# Patient Record
Sex: Male | Born: 1935
Health system: Southern US, Community
[De-identification: ages and names within clinical notes are randomized; demographics above are authoritative.]

## PROBLEM LIST (undated history)

## (undated) DIAGNOSIS — I10 Essential (primary) hypertension: Secondary | ICD-10-CM

## (undated) DIAGNOSIS — I442 Atrioventricular block, complete: Secondary | ICD-10-CM

## (undated) DIAGNOSIS — E119 Type 2 diabetes mellitus without complications: Secondary | ICD-10-CM

## (undated) DIAGNOSIS — C801 Malignant (primary) neoplasm, unspecified: Secondary | ICD-10-CM

## (undated) DIAGNOSIS — I2510 Atherosclerotic heart disease of native coronary artery without angina pectoris: Secondary | ICD-10-CM

## (undated) DIAGNOSIS — H5440 Blindness, one eye, unspecified eye: Secondary | ICD-10-CM

## (undated) DIAGNOSIS — L03211 Cellulitis of face: Secondary | ICD-10-CM

## (undated) DIAGNOSIS — H3530 Unspecified macular degeneration: Secondary | ICD-10-CM

## (undated) DIAGNOSIS — N184 Chronic kidney disease, stage 4 (severe): Secondary | ICD-10-CM

## (undated) DIAGNOSIS — E785 Hyperlipidemia, unspecified: Secondary | ICD-10-CM

## (undated) DIAGNOSIS — I4821 Permanent atrial fibrillation: Secondary | ICD-10-CM

## (undated) DIAGNOSIS — E039 Hypothyroidism, unspecified: Secondary | ICD-10-CM

## (undated) HISTORY — DX: Blindness, one eye, unspecified eye: H54.40

## (undated) HISTORY — DX: Unspecified macular degeneration: H35.30

## (undated) HISTORY — DX: Hyperlipidemia, unspecified: E78.5

## (undated) HISTORY — DX: Atherosclerotic heart disease of native coronary artery without angina pectoris: I25.10

## (undated) HISTORY — DX: Permanent atrial fibrillation: I48.21

## (undated) HISTORY — PX: INSERT / REPLACE / REMOVE PACEMAKER: SUR710

## (undated) HISTORY — DX: Hypothyroidism, unspecified: E03.9

## (undated) HISTORY — DX: Atrioventricular block, complete: I44.2

---

## 1995-06-12 HISTORY — PX: OTHER SURGICAL HISTORY: SHX169

## 2002-01-26 ENCOUNTER — Encounter: Payer: Self-pay | Admitting: Urology

## 2002-01-26 ENCOUNTER — Encounter: Admission: RE | Admit: 2002-01-26 | Discharge: 2002-01-26 | Payer: Self-pay | Admitting: Urology

## 2002-05-20 ENCOUNTER — Ambulatory Visit: Admission: RE | Admit: 2002-05-20 | Discharge: 2002-06-01 | Payer: Self-pay | Admitting: Radiation Oncology

## 2003-07-14 ENCOUNTER — Ambulatory Visit: Admission: RE | Admit: 2003-07-14 | Discharge: 2003-10-12 | Payer: Self-pay | Admitting: Radiation Oncology

## 2003-10-19 ENCOUNTER — Ambulatory Visit: Admission: RE | Admit: 2003-10-19 | Discharge: 2004-01-17 | Payer: Self-pay | Admitting: Radiation Oncology

## 2006-07-18 ENCOUNTER — Ambulatory Visit: Payer: Self-pay | Admitting: Internal Medicine

## 2006-07-18 LAB — CONVERTED CEMR LAB
Basophils Relative: 0.9 % (ref 0.0–1.0)
Eosinophils Relative: 5.9 % — ABNORMAL HIGH (ref 0.0–5.0)
HCT: 35.4 % — ABNORMAL LOW (ref 39.0–52.0)
INR: 1 (ref 0.9–2.0)
MCV: 88.3 fL (ref 78.0–100.0)
Neutrophils Relative %: 47.8 % (ref 43.0–77.0)
Prothrombin Time: 12.1 s (ref 10.0–14.0)
RBC: 4.01 M/uL — ABNORMAL LOW (ref 4.22–5.81)
RDW: 11.9 % (ref 11.5–14.6)
WBC: 5.6 10*3/uL (ref 4.5–10.5)

## 2006-07-19 ENCOUNTER — Ambulatory Visit: Payer: Self-pay | Admitting: Internal Medicine

## 2006-07-19 ENCOUNTER — Encounter (INDEPENDENT_AMBULATORY_CARE_PROVIDER_SITE_OTHER): Payer: Self-pay | Admitting: *Deleted

## 2006-08-06 ENCOUNTER — Ambulatory Visit (HOSPITAL_COMMUNITY): Admission: RE | Admit: 2006-08-06 | Discharge: 2006-08-06 | Payer: Self-pay | Admitting: Internal Medicine

## 2007-07-04 ENCOUNTER — Ambulatory Visit: Payer: Self-pay | Admitting: Cardiovascular Disease

## 2007-07-04 ENCOUNTER — Inpatient Hospital Stay (HOSPITAL_COMMUNITY): Admission: EM | Admit: 2007-07-04 | Discharge: 2007-07-09 | Payer: Self-pay | Admitting: Emergency Medicine

## 2007-07-07 ENCOUNTER — Encounter: Payer: Self-pay | Admitting: Cardiovascular Disease

## 2007-07-30 ENCOUNTER — Ambulatory Visit: Payer: Self-pay | Admitting: Cardiology

## 2007-10-14 ENCOUNTER — Ambulatory Visit: Payer: Self-pay | Admitting: Internal Medicine

## 2007-10-29 ENCOUNTER — Ambulatory Visit: Payer: Self-pay | Admitting: Cardiology

## 2008-06-30 ENCOUNTER — Ambulatory Visit: Payer: Self-pay | Admitting: Cardiology

## 2008-08-12 ENCOUNTER — Encounter: Payer: Self-pay | Admitting: Internal Medicine

## 2008-08-17 ENCOUNTER — Ambulatory Visit: Payer: Self-pay | Admitting: Internal Medicine

## 2008-09-29 ENCOUNTER — Ambulatory Visit: Payer: Self-pay | Admitting: Internal Medicine

## 2008-12-29 ENCOUNTER — Ambulatory Visit: Payer: Self-pay | Admitting: Internal Medicine

## 2009-01-27 IMAGING — RF DG BE W/ CM - WO/W KUB
15 of 19 series · 15 of 19 positions shown · non-contrast
Comparison: none

CLINICAL DATA: Rectal bleeding, incomplete colonoscopy.  The patient states that ?90%? of his colon was examined at recent colonoscopy. 
 BARIUM ENEMA:

[Series 1: run · 1 of 1 slices shown (1 of 12)]
[im 1/1]
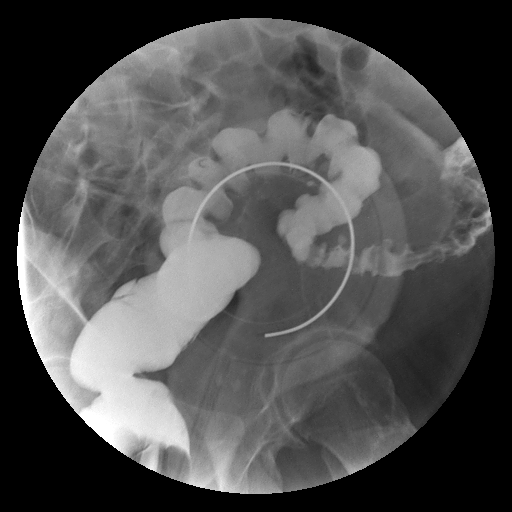

[Series 2: run · 1 of 1 slices shown (2 of 12)]
[im 1/1]
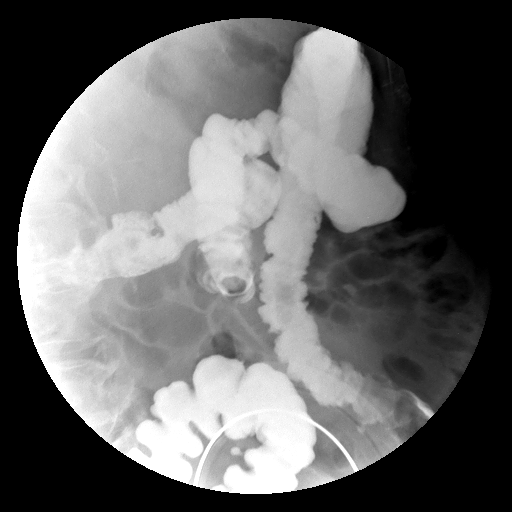

[Series 4: run · 1 of 1 slices shown (3 of 12)]
[im 1/1]
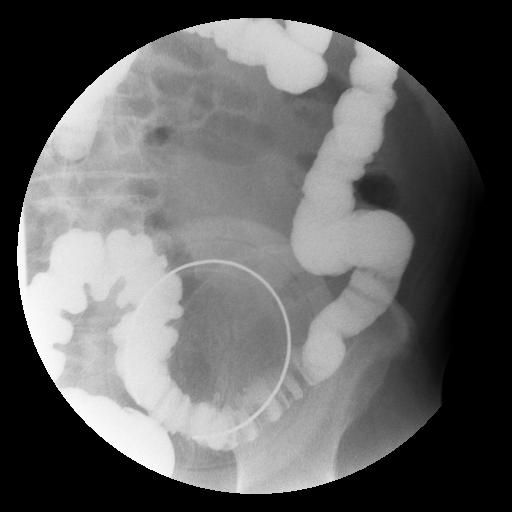

[Series 5: run · 1 of 1 slices shown (4 of 12)]
[im 1/1]
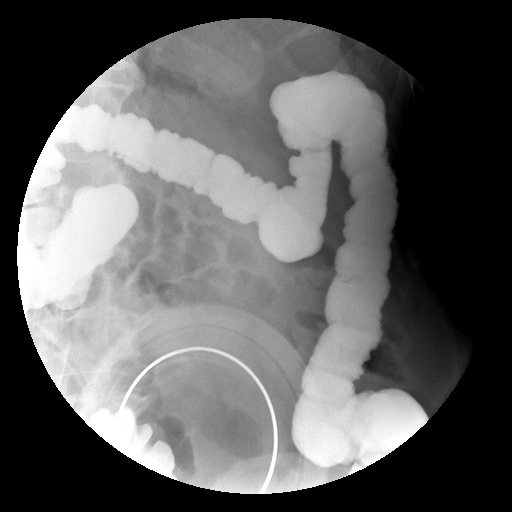

[Series 6: run · 1 of 1 slices shown (5 of 12)]
[im 1/1]
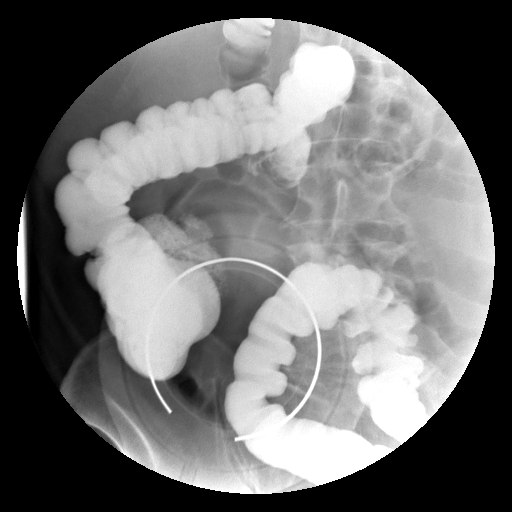

[Series 7: run · 1 of 1 slices shown (6 of 12)]
[im 1/1]
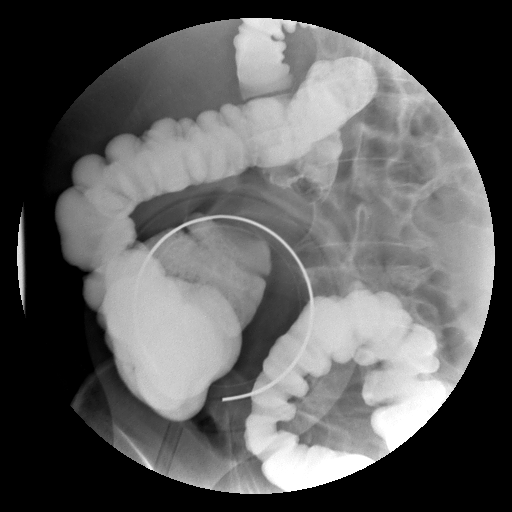

[Series 9: run · 1 of 1 slices shown (7 of 12)]
[im 1/1]
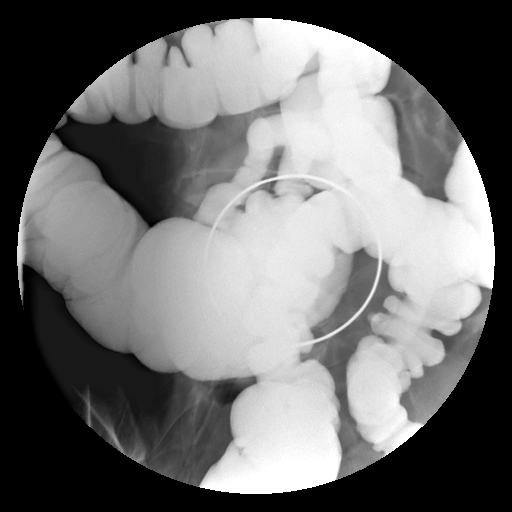

[Series 10: run · 1 of 1 slices shown (8 of 12)]
[im 1/1]
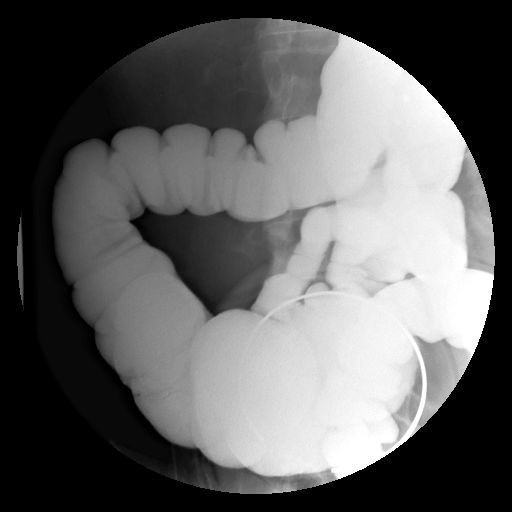

[Series 11: run · 1 of 1 slices shown (9 of 12)]
[im 1/1]
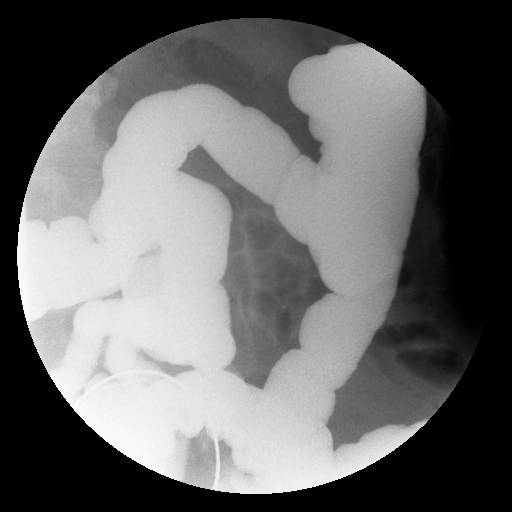

[Series 13: run · 1 of 1 slices shown (10 of 12)]
[im 1/1]
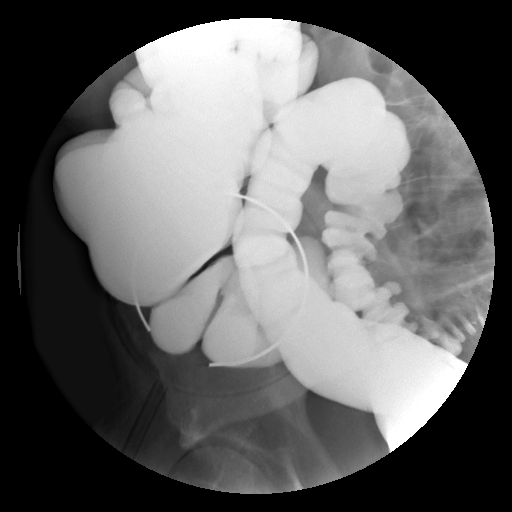

[Series 14: run · 1 of 1 slices shown (11 of 12)]
[im 1/1]
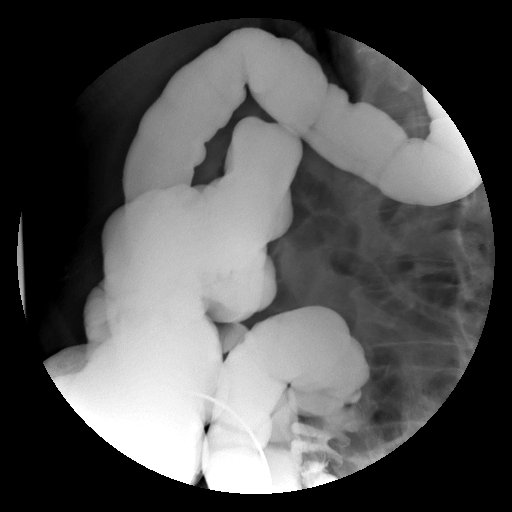

[Series 15: run · 1 of 1 slices shown (12 of 12)]
[im 1/1]
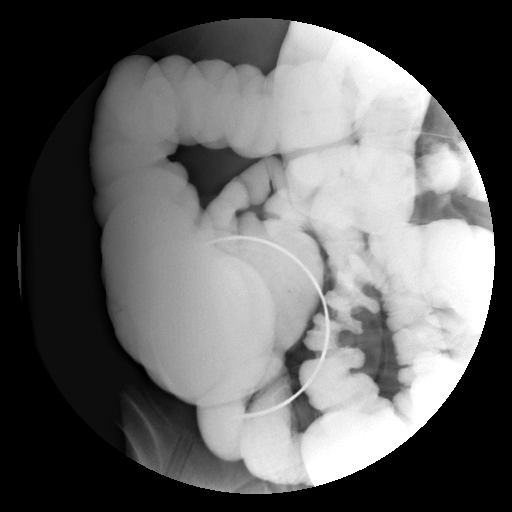

[Series 1001: view not recorded · 0.20mm/px · 1 of 1 slices shown (1 of 3)]
[im 1/1]
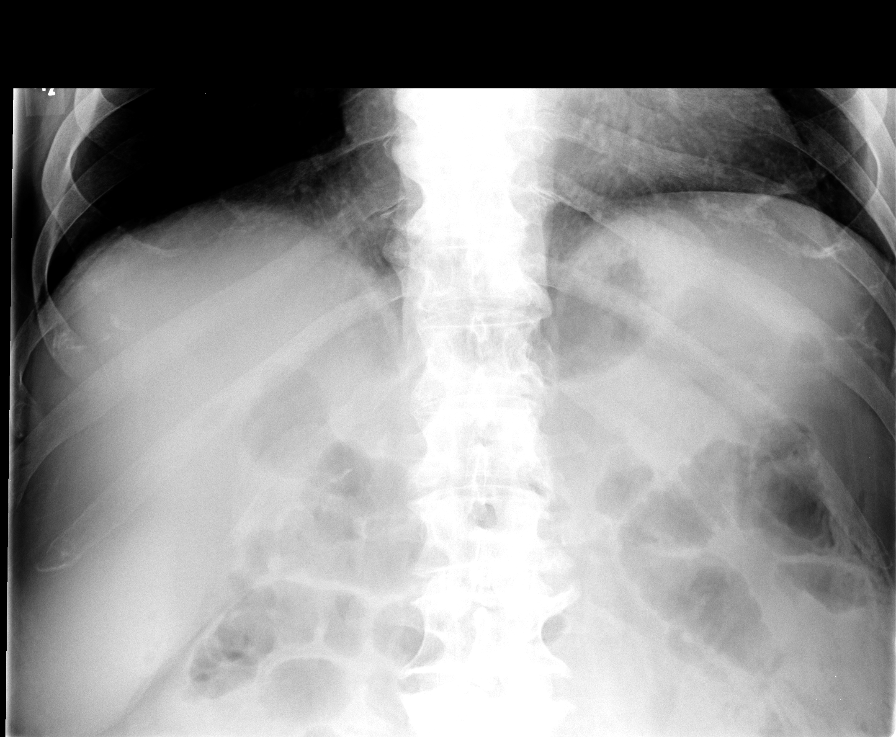

[Series 1003: view not recorded · 0.20mm/px · 1 of 1 slices shown (2 of 3)]
[im 1/1]
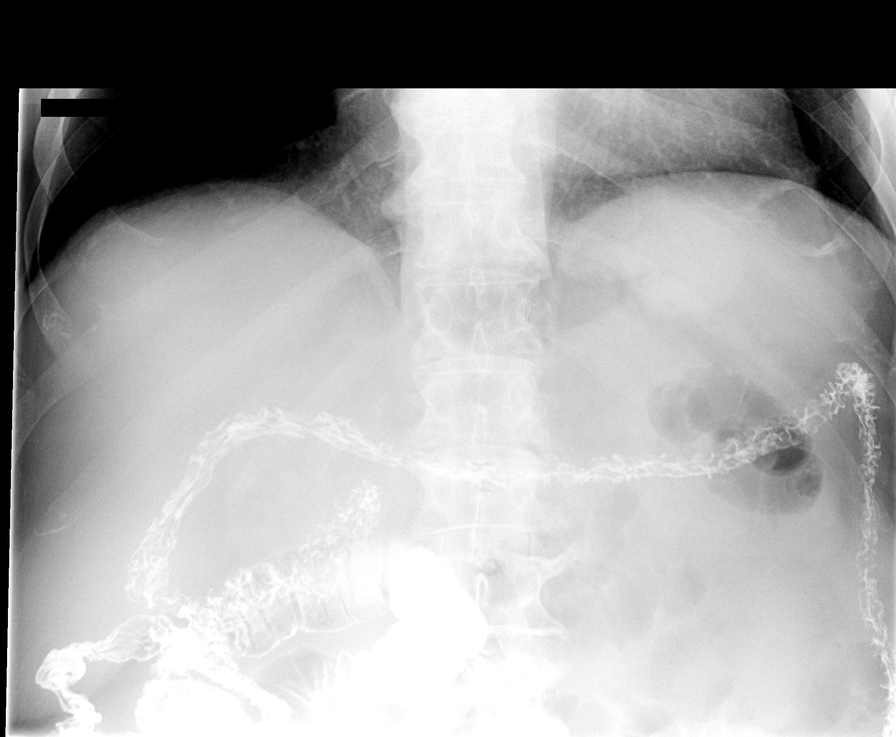

[Series 1004: view not recorded · 0.20mm/px · 1 of 1 slices shown (3 of 3)]
[im 1/1]
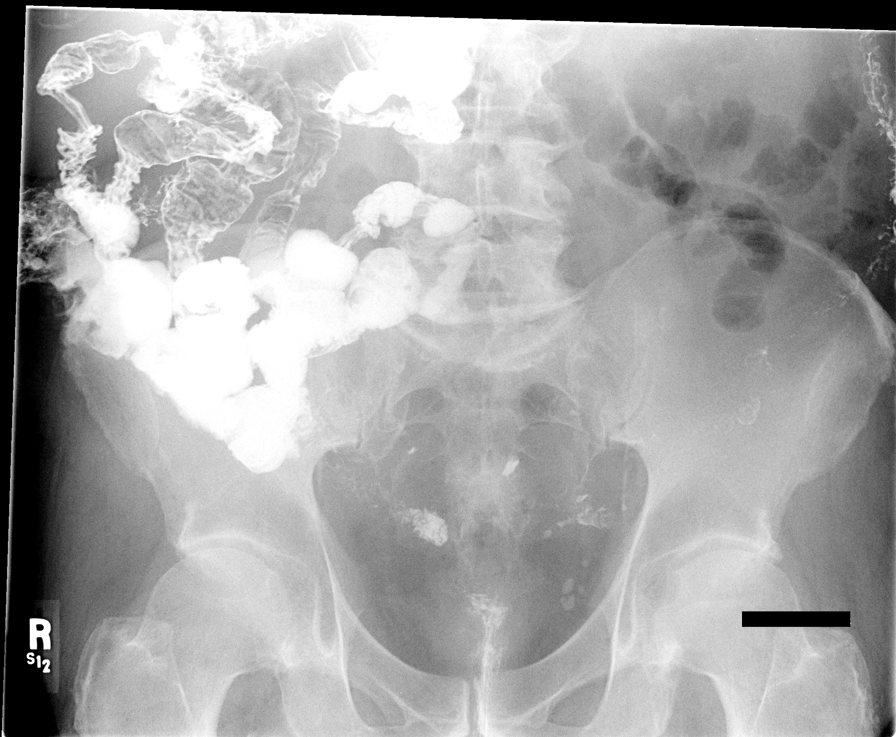

[15 of 19 positions shown; findings below may reference images not displayed]

FINDINGS: KUB ? unremarkable. 
 Barium was introduced into the rectum and advanced to the cecum without obstruction or delay.  Multiple compression spot films were obtained as the barium column advanced.  
 The colon is quite redundant, especially at the flexures and in the cecal region.  The cecum is elongated and extends up toward the left upper quadrant distally.  There are no filling defects that would suggest significant polyps.  No mucosal lesions are noted that would be consistent with carcinoma.  
 There are sigmoid diverticula with no evidence for diverticulitis.  There is reflux into the distal small bowel.
IMPRESSION: 1.  Markedly redundant colon with an elongated cecum. 
 2.  There are scattered diverticula in the sigmoid colon, but there are no other lesions demonstrated.  Specifically, the cecum and ascending colon are unremarkable.

## 2009-02-25 ENCOUNTER — Ambulatory Visit: Payer: Self-pay | Admitting: Internal Medicine

## 2009-03-02 ENCOUNTER — Telehealth: Payer: Self-pay | Admitting: Internal Medicine

## 2009-03-30 ENCOUNTER — Ambulatory Visit: Payer: Self-pay | Admitting: Internal Medicine

## 2009-04-07 ENCOUNTER — Encounter (INDEPENDENT_AMBULATORY_CARE_PROVIDER_SITE_OTHER): Payer: Self-pay | Admitting: *Deleted

## 2009-06-29 ENCOUNTER — Ambulatory Visit: Payer: Self-pay | Admitting: Internal Medicine

## 2009-07-12 DIAGNOSIS — L03211 Cellulitis of face: Secondary | ICD-10-CM

## 2009-07-12 HISTORY — DX: Cellulitis of face: L03.211

## 2009-07-13 ENCOUNTER — Inpatient Hospital Stay (HOSPITAL_COMMUNITY): Admission: EM | Admit: 2009-07-13 | Discharge: 2009-07-17 | Payer: Self-pay | Admitting: Emergency Medicine

## 2009-09-28 ENCOUNTER — Ambulatory Visit: Payer: Self-pay | Admitting: Internal Medicine

## 2009-12-28 ENCOUNTER — Encounter: Payer: Self-pay | Admitting: Internal Medicine

## 2010-02-02 ENCOUNTER — Ambulatory Visit: Payer: Self-pay | Admitting: Internal Medicine

## 2010-02-02 DIAGNOSIS — Z950 Presence of cardiac pacemaker: Secondary | ICD-10-CM

## 2010-02-02 DIAGNOSIS — R0989 Other specified symptoms and signs involving the circulatory and respiratory systems: Secondary | ICD-10-CM

## 2010-02-02 DIAGNOSIS — E785 Hyperlipidemia, unspecified: Secondary | ICD-10-CM

## 2010-02-02 DIAGNOSIS — I442 Atrioventricular block, complete: Secondary | ICD-10-CM | POA: Insufficient documentation

## 2010-02-02 DIAGNOSIS — R42 Dizziness and giddiness: Secondary | ICD-10-CM | POA: Insufficient documentation

## 2010-02-02 DIAGNOSIS — R0609 Other forms of dyspnea: Secondary | ICD-10-CM | POA: Insufficient documentation

## 2010-02-03 ENCOUNTER — Encounter: Payer: Self-pay | Admitting: Internal Medicine

## 2010-02-16 ENCOUNTER — Telehealth (INDEPENDENT_AMBULATORY_CARE_PROVIDER_SITE_OTHER): Payer: Self-pay | Admitting: *Deleted

## 2010-02-20 ENCOUNTER — Encounter (HOSPITAL_COMMUNITY): Admission: RE | Admit: 2010-02-20 | Discharge: 2010-03-01 | Payer: Self-pay | Admitting: Internal Medicine

## 2010-02-20 ENCOUNTER — Ambulatory Visit: Payer: Self-pay | Admitting: Cardiovascular Disease

## 2010-02-20 ENCOUNTER — Ambulatory Visit: Payer: Self-pay

## 2010-02-20 ENCOUNTER — Encounter: Payer: Self-pay | Admitting: Cardiovascular Disease

## 2010-04-18 ENCOUNTER — Encounter: Payer: Self-pay | Admitting: Cardiology

## 2010-05-11 ENCOUNTER — Ambulatory Visit: Payer: Self-pay | Admitting: Internal Medicine

## 2010-05-12 ENCOUNTER — Encounter: Payer: Self-pay | Admitting: Cardiology

## 2010-07-02 ENCOUNTER — Encounter: Payer: Self-pay | Admitting: Nephrology

## 2010-07-11 NOTE — Miscellaneous (Signed)
  Clinical Lists Changes  Observations: Added new observation of NUCLEAR NOS: Exercise Capacity: Adenosine study with no exercise. BP Response: Normal blood pressure response. Clinical Symptoms: Chest Tightness ECG Impression: LBBB Overall Impression: Normal stress nuclear study.  (02/20/2010 9:48)      Nuclear Study  Procedure date:  02/20/2010  Findings:      Exercise Capacity: Adenosine study with no exercise. BP Response: Normal blood pressure response. Clinical Symptoms: Chest Tightness ECG Impression: LBBB Overall Impression: Normal stress nuclear study.

## 2010-07-11 NOTE — Cardiovascular Report (Signed)
Summary: TTM   TTM   Imported By: Sallee Provencal 01/06/2010 08:49:35  _____________________________________________________________________  External Attachment:    Type:   Image     Comment:   External Document

## 2010-07-11 NOTE — Progress Notes (Signed)
Summary: Nuclear pre procedure  Phone Note Outgoing Call Call back at Home Phone [phone]   Call placed by: Valetta Fuller, CMA,  February 16, 2010 3:23 PM Call placed to: Patient Summary of Call: Reviewed information on Myoview Information Sheet (see scanned document for further details).  Spoke with patient.      Nuclear Med Background Indications for Stress Test: Evaluation for Ischemia, Stent Patency   History: Heart Catheterization, Pacemaker, Stents   Symptoms: Dizziness, DOE    Nuclear Pre-Procedure Cardiac Risk Factors: Hypertension, LBBB, Lipids Height (in): 74

## 2010-07-11 NOTE — Cardiovascular Report (Signed)
Summary: Office Visit   Office Visit   Imported By: Sallee Provencal 02/03/2010 10:21:16  _____________________________________________________________________  External Attachment:    Type:   Image     Comment:   External Document

## 2010-07-11 NOTE — Cardiovascular Report (Signed)
Summary: TTM   TTM   Imported By: Sallee Provencal 07/29/2009 15:52:06  _____________________________________________________________________  External Attachment:    Type:   Image     Comment:   External Document

## 2010-07-11 NOTE — Assessment & Plan Note (Signed)
Summary: Cardiology Nuclear Testing  Nuclear Med Background Indications for Stress Test: Evaluation for Ischemia, Stent Patency   History: Heart Catheterization, Pacemaker, Stents   Symptoms: Dizziness, DOE, Fatigue    Nuclear Pre-Procedure Cardiac Risk Factors: Family History - CAD, History of Smoking, Hypertension, LBBB, Lipids Caffeine/Decaff Intake: None NPO After: 8:00 PM Lungs: clear IV 0.9% NS with Angio Cath: 20g     IV Site: R Antecubital IV Started by: Irven Baltimore, RN Chest Size (in) 46     Height (in): 74 Weight (lb): 228 BMI: 29.38 Tech Comments: Held metoprolol this am.  Nuclear Med Study 1 or 2 day study:  1 day     Stress Test Type:  Adenosine Reading MD:  Jenkins Rouge, MD     Referring MD:  Jolyn Nap Resting Radionuclide:  Technetium 97m Tetrofosmin     Resting Radionuclide Dose:  11 mCi  Stress Radionuclide:  Technetium 78m Tetrofosmin     Stress Radionuclide Dose:  33 mCi   Stress Protocol  Max Systolic BP: Q000111Q mm HgDose of Adenosine:  58 mg    Stress Test Technologist:  Matilde Haymaker, RN     Nuclear Technologist:  Annye Rusk, CNMT  Rest Procedure  Myocardial perfusion imaging was performed at rest 45 minutes following the intravenous administration of Technetium 5m Tetrofosmin.  Stress Procedure  The patient received IV adenosine at 140 mcg/kg/min for 4 minutes.Patient had some whoozy feeling,SOB and chest tightness with infusion. Symptoms relieved in recovery.  Patient had PVC's with infusion. There were no significant changes with infusion. Technetium 42m Tetrofosmin was injected at the 2 minute mark and quantitative spect images were obtained after a 45 minute delay.  QPS Raw Data Images:  Normal; no motion artifact; normal heart/lung ratio. Stress Images:  Normal homogeneous uptake in all areas of the myocardium. Rest Images:  Normal homogeneous uptake in all areas of the myocardium. Subtraction (SDS):  Normal Transient Ischemic  Dilatation:  0.99  (Normal <1.22)  Lung/Heart Ratio:  0.33  (Normal <0.45)  Quantitative Gated Spect Images QGS EDV:  92 ml QGS ESV:  34 ml QGS EF:  63 % QGS cine images:  normal  Findings Normal nuclear study      Overall Impression  Exercise Capacity: Adenosine study with no exercise. BP Response: Normal blood pressure response. Clinical Symptoms: Chest Tightness ECG Impression: LBBB Overall Impression: Normal stress nuclear study.  Appended Document: Cardiology Nuclear Testing PT AWARE./CY

## 2010-07-11 NOTE — Assessment & Plan Note (Signed)
Summary: problems with choloesterol med/mt  Medications Added HYDROCHLOROTHIAZIDE 25 MG TABS (HYDROCHLOROTHIAZIDE) 1/2 tablet once daily PLAVIX 75 MG TABS (CLOPIDOGREL BISULFATE) take one tablet once daily LEVOTHROID 150 MCG TABS (LEVOTHYROXINE SODIUM) once daily METOPROLOL TARTRATE 50 MG TABS (METOPROLOL TARTRATE) take one tablet once daily LIPITOR 20 MG TABS (ATORVASTATIN CALCIUM) take one tablet once daily NITROSTAT 0.4 MG SUBL (NITROGLYCERIN) as needed LOVAZA 1 GM CAPS (OMEGA-3-ACID ETHYL ESTERS) take 2 capsules two times a day CHELATED ZINC 50 MG TABS (ZINC) every other day CENTRUM  TABS (MULTIPLE VITAMINS-MINERALS) once daily BAYER ASPIRIN 325 MG TABS (ASPIRIN) take one tablet once daily LIPOFEN 150 MG CAPS (FENOFIBRATE) 1 once daily      Allergies Added: NKDA  CC:  problems with cholesterol.  Marland Kitchen  History of Present Illness:   [name] is seen in followup for a pacemaker implanted for CHB jan 2009.  He aslo has CAD wqith a chronically occluded right coronary artery. He had an LAD with 90% stenosis at the take off of the first diagonal.  He had a drug-eluting stent placed by Dr. Burt Knack .  He also had a history of dyslipidemia and he comes in with his daughter today concerned about his medications. Specifically he is taking 3 lipid-lowering agent Lipitor, fenofibrate, and fish oils.  He is also having problems with exertional shortness of breath accompanied by a flank discomfort. This is a relatively stable over the last 6-12 months but is significantly limiting. It occurs at less than about 100 feet. It does not occur at rest.When he presented with his MI he had anterior chest tightness; this is different from the current discomfort.  He has not had peripheral edema orthopnea or nocturnal dyspnea  Current Medications (verified): 1)  Hydrochlorothiazide 25 Mg Tabs (Hydrochlorothiazide) .... 1/2 Tablet Once Daily 2)  Lipitor 20 Mg Tabs (Atorvastatin Calcium) .Marland Kitchen.. 1 Tab By Mouth At  Bedtime 3)  Plavix 75 Mg Tabs (Clopidogrel Bisulfate) .... Take One Tablet Once Daily 4)  Levothroid 150 Mcg Tabs (Levothyroxine Sodium) .... Once Daily 5)  Metoprolol Tartrate 50 Mg Tabs (Metoprolol Tartrate) .... Take One Tablet Once Daily 6)  Nitrostat 0.4 Mg Subl (Nitroglycerin) .... As Needed 7)  Lovaza 1 Gm Caps (Omega-3-Acid Ethyl Esters) .... Take 2 Capsules Two Times A Day 8)  Chelated Zinc 50 Mg Tabs (Zinc) .... Every Other Day 9)  Centrum  Tabs (Multiple Vitamins-Minerals) .... Once Daily 10)  Bayer Aspirin 325 Mg Tabs (Aspirin) .... Take One Tablet Once Daily 11)  Lipofen 150 Mg Caps (Fenofibrate) .Marland Kitchen.. 1 Once Daily  Allergies (verified): No Known Drug Allergies  Past History:  Social History: Last updated: 02/02/2010 Married with 3 children, lives with wife.  He is retired from [employer].  He does not smoke.  He does not drink alcohol.   Past Medical History: Coronary artery disease (non-Q-wave myocardial infarction in     January 2009.  He had a chronically occluded right coronary artery.     He had an LAD with 90% stenosis at the take off of the first     diagonal.  He had a drug-eluting stent placed by Dr. Burt Knack). Complete heart block (Zephyr dual chamber pacemaker per Dr. Caryl Comes). 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  He is retired from [employer].  He does not smoke.  He does not drink alcohol.   Vital Signs:  Patient profile:   77 year old male Height:      74 inches Weight:      230 pounds BMI:     29.64 Pulse rate:   61 / minute Pulse rhythm:   regular BP sitting:   108 / 62  (left arm)  Vitals Entered By: Doug Sou CMA (February 02, 2010 3:32 PM)  Physical Exam  General:  The patient was alert and oriented in no acute distress. 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  Neck veins were flat, carotids were brisk.  Lungs were clear.  Heart sounds were regular without murmurs or gallops.  Abdomen was soft with active bowel sounds. There is no clubbing cyanosis or edema. Skin Warm and dry    EKG  Procedure date:  02/02/2010  Findings:      AV pacing  PPM Specifications Following MD:  Virl Axe, MD     PPM Vendor:  [employer] Jude     PPM Model Number:  [id number]     PPM Serial Number:  [id number] PPM DOI:  07/08/2007     PPM Implanting MD:  Virl Axe, MD  Lead 1    Location: RA     DOI: 07/08/2007     Model #: [id number]     Serial #: [id number]     Status: active Lead 2    Location: RV     DOI: 07/08/2007     Model #: [id number]     Serial #: [id number]     Status: active  Magnet Response Rate:  BOL 98.6 ERI 86.3  Indications:  CHB   PPM Follow Up Battery Voltage:  2.79 V     Battery Est. Longevity:  6.75-9.25 yrs       PPM Device Measurements Atrium  Amplitude: 4.6 mV, Impedance: 423 ohms, Threshold: 0.75 V at 0.4 msec Right Ventricle  Amplitude: 8.6 mV, Impedance: 403 ohms, Threshold: 0.875 V at 0.4 msec  Episodes MS Episodes:  56     Percent Mode Switch:  <1%     Ventricular High Rate:  0     Atrial Pacing:  40%     Ventricular Pacing:  >99%  Parameters Mode:  DDD     Lower Rate Limit:  60     Upper Rate Limit:  110 Paced AV Delay:  180     Sensed AV Delay:  160 Next Cardiology Appt Due:  07/12/2010 Tech Comments:  NORMAL DEVICE FUNCTION.  CHANGED RV OUTPUT FROM 1.00 TO 1.125 V.  ROV IN 6 MTHS W/DEVICE CLINIC. Shelly Bombard  February 02, 2010 4:20 PM  Impression & Recommendations:  Problem # 1:  DYSPNEA ON EXERTION (ICD-786.09)  thas dyspnea on exertion associated with unusual chest/back discomfort. This may represent an anginal equivalent. He is also ventricularly paced 100% of the time raising the possibility of a cardiomyopathy contributing to his symptoms. We'll investigate these both simultaneously with an adenosine Myoview scan.  I  should note that he is chronically competent as assessed by his device histogram printout  Orders: Nuclear Stress Test (Nuc Stress Test)  Problem # 2:  ORTHOSTATIC DIZZINESS (ICD-780.4) this is been a problem. It was some better now than it has been. His blood pressure is borderline at 108 today. We'll plan to discontinue his hydrochlorothiazide  Problem # 3:  CAD RCA-TOTAL; LAD-DES JAN 2009 (ICD-414.00)  please see the above; we'll decrease his aspirin from 325-81 mg His updated medication list for this  problem includes:    Plavix 75 Mg Tabs (Clopidogrel bisulfate) .Marland Kitchen... Take one tablet once daily    Metoprolol Tartrate 50 Mg Tabs (Metoprolol tartrate) .Marland Kitchen... Take one tablet once daily    Nitrostat 0.4 Mg Subl (Nitroglycerin) .Marland Kitchen... As needed    Bayer Aspirin 325 Mg Tabs (Aspirin) .Marland Kitchen... Take one tablet once daily  His updated medication list for this problem includes:    Plavix 75 Mg Tabs (Clopidogrel bisulfate) .Marland Kitchen... Take one tablet once daily    Metoprolol Tartrate 50 Mg Tabs (Metoprolol tartrate) .Marland Kitchen... Take one tablet once daily    Nitrostat 0.4 Mg Subl (Nitroglycerin) .Marland Kitchen... As needed    Bayer Aspirin 325 Mg Tabs (Aspirin) .Marland Kitchen... Take one tablet once daily  Problem # 4:  AV BLOCK, COMPLETE (ICD-426.0) the patient has an escape rhythm but has complete heart block  Problem # 5:  PACEMAKER, PERMANENT (ICD-V45.01) Device parameters and data were reviewed and no changes were made  Problem # 6:  DYSLIPIDEMIA (ICD-272.4) the patient is concerned about his lipid medications. We do not have access to these data; there do again tomorrow. We will plan this point to stop his fenofibrate. Will he'll leave on the other medications and see how he's doing. We'll have him follow with Dr. Percival Spanish in 3 months time and let him adjudicate the medications The following medications were removed from the medication list:    Lipitor 20 Mg Tabs (Atorvastatin calcium) .Marland Kitchen... Take one tablet once daily His  updated medication list for this problem includes:    Lipitor 20 Mg Tabs (Atorvastatin calcium) .Marland Kitchen... 1 tab by mouth at bedtime    Lovaza 1 Gm Caps (Omega-3-acid ethyl esters) .Marland Kitchen... Take 2 capsules two times a day    Lipofen 150 Mg Caps (Fenofibrate) .Marland Kitchen... 1 once daily  The following medications were removed from the medication list:    Lipitor 20 Mg Tabs (Atorvastatin calcium) .Marland Kitchen... Take one tablet once daily His updated medication list for this problem includes:    Lipitor 20 Mg Tabs (Atorvastatin calcium) .Marland Kitchen... 1 tab by mouth at bedtime    Lovaza 1 Gm Caps (Omega-3-acid ethyl esters) .Marland Kitchen... Take 2 capsules two times a day    Lipofen 150 Mg Caps (Fenofibrate) .Marland Kitchen... 1 once daily  Other Orders: EKG w/ Interpretation (93000)  Patient Instructions: 1)  Your physician recommends that you schedule a follow-up appointment in: [place] 2)  Your physician has recommended you make the following change in your medication: [place] 3)  DECREASE ASPIRIN TO 81 MG 4)  Your physician has requested that you have an adenosine myoview.  For further information please visit [email or web address].  Please follow instruction sheet, as given.

## 2010-07-11 NOTE — Cardiovascular Report (Signed)
Summary: TTM   TTM   Imported By: Sallee Provencal 10/07/2009 15:05:37  _____________________________________________________________________  External Attachment:    Type:   Image     Comment:   External Document

## 2010-07-13 NOTE — Cardiovascular Report (Signed)
Summary: TTM   TTM   Imported By: Sallee Provencal 05/26/2010 15:09:15  _____________________________________________________________________  External Attachment:    Type:   Image     Comment:   External Document

## 2010-07-18 ENCOUNTER — Encounter (INDEPENDENT_AMBULATORY_CARE_PROVIDER_SITE_OTHER): Payer: Self-pay | Admitting: *Deleted

## 2010-07-24 ENCOUNTER — Encounter (HOSPITAL_COMMUNITY): Payer: Self-pay | Admitting: Radiology

## 2010-07-24 ENCOUNTER — Other Ambulatory Visit: Payer: Self-pay | Admitting: Family Medicine

## 2010-07-24 ENCOUNTER — Inpatient Hospital Stay (HOSPITAL_COMMUNITY)
Admission: EM | Admit: 2010-07-24 | Discharge: 2010-07-27 | DRG: 699 | Disposition: A | Discharge status: Home Health | Payer: Medicare Other | Attending: Internal Medicine | Admitting: Internal Medicine

## 2010-07-24 ENCOUNTER — Emergency Department (HOSPITAL_COMMUNITY): Payer: Medicare Other

## 2010-07-24 DIAGNOSIS — Z79899 Other long term (current) drug therapy: Secondary | ICD-10-CM

## 2010-07-24 DIAGNOSIS — Z8546 Personal history of malignant neoplasm of prostate: Secondary | ICD-10-CM

## 2010-07-24 DIAGNOSIS — R2681 Unsteadiness on feet: Secondary | ICD-10-CM

## 2010-07-24 DIAGNOSIS — Z7902 Long term (current) use of antithrombotics/antiplatelets: Secondary | ICD-10-CM

## 2010-07-24 DIAGNOSIS — Z950 Presence of cardiac pacemaker: Secondary | ICD-10-CM

## 2010-07-24 DIAGNOSIS — I129 Hypertensive chronic kidney disease with stage 1 through stage 4 chronic kidney disease, or unspecified chronic kidney disease: Secondary | ICD-10-CM | POA: Diagnosis present

## 2010-07-24 DIAGNOSIS — L03211 Cellulitis of face: Secondary | ICD-10-CM | POA: Diagnosis present

## 2010-07-24 DIAGNOSIS — I2510 Atherosclerotic heart disease of native coronary artery without angina pectoris: Secondary | ICD-10-CM | POA: Diagnosis present

## 2010-07-24 DIAGNOSIS — L0201 Cutaneous abscess of face: Secondary | ICD-10-CM | POA: Diagnosis present

## 2010-07-24 DIAGNOSIS — I252 Old myocardial infarction: Secondary | ICD-10-CM

## 2010-07-24 DIAGNOSIS — Z8673 Personal history of transient ischemic attack (TIA), and cerebral infarction without residual deficits: Secondary | ICD-10-CM

## 2010-07-24 DIAGNOSIS — E860 Dehydration: Secondary | ICD-10-CM | POA: Diagnosis present

## 2010-07-24 DIAGNOSIS — R269 Unspecified abnormalities of gait and mobility: Secondary | ICD-10-CM | POA: Diagnosis present

## 2010-07-24 DIAGNOSIS — Z9861 Coronary angioplasty status: Secondary | ICD-10-CM

## 2010-07-24 DIAGNOSIS — E039 Hypothyroidism, unspecified: Secondary | ICD-10-CM | POA: Diagnosis present

## 2010-07-24 DIAGNOSIS — N183 Chronic kidney disease, stage 3 unspecified: Secondary | ICD-10-CM | POA: Diagnosis present

## 2010-07-24 DIAGNOSIS — E785 Hyperlipidemia, unspecified: Secondary | ICD-10-CM | POA: Diagnosis present

## 2010-07-24 DIAGNOSIS — H3530 Unspecified macular degeneration: Secondary | ICD-10-CM | POA: Diagnosis present

## 2010-07-24 DIAGNOSIS — R42 Dizziness and giddiness: Secondary | ICD-10-CM

## 2010-07-24 DIAGNOSIS — Z7982 Long term (current) use of aspirin: Secondary | ICD-10-CM

## 2010-07-24 DIAGNOSIS — E119 Type 2 diabetes mellitus without complications: Secondary | ICD-10-CM | POA: Diagnosis present

## 2010-07-24 DIAGNOSIS — N289 Disorder of kidney and ureter, unspecified: Principal | ICD-10-CM | POA: Diagnosis present

## 2010-07-24 HISTORY — DX: Essential (primary) hypertension: I10

## 2010-07-24 HISTORY — DX: Cellulitis of face: L03.211

## 2010-07-24 HISTORY — DX: Malignant (primary) neoplasm, unspecified: C80.1

## 2010-07-24 LAB — DIFFERENTIAL
Lymphocytes Relative: 24 % (ref 12–46)
Monocytes Absolute: 0.6 10*3/uL (ref 0.1–1.0)
Monocytes Relative: 8 % (ref 3–12)
Neutro Abs: 5.1 10*3/uL (ref 1.7–7.7)

## 2010-07-24 LAB — URINE MICROSCOPIC-ADD ON

## 2010-07-24 LAB — CBC
HCT: 36.5 % — ABNORMAL LOW (ref 39.0–52.0)
Hemoglobin: 12.1 g/dL — ABNORMAL LOW (ref 13.0–17.0)
MCH: 28.9 pg (ref 26.0–34.0)
MCHC: 33.2 g/dL (ref 30.0–36.0)
RBC: 4.18 MIL/uL — ABNORMAL LOW (ref 4.22–5.81)

## 2010-07-24 LAB — POCT I-STAT, CHEM 8
BUN: 28 mg/dL — ABNORMAL HIGH (ref 6–23)
Creatinine, Ser: 1.9 mg/dL — ABNORMAL HIGH (ref 0.4–1.5)
Glucose, Bld: 125 mg/dL — ABNORMAL HIGH (ref 70–99)
Hemoglobin: 12.9 g/dL — ABNORMAL LOW (ref 13.0–17.0)
Potassium: 4.1 mEq/L (ref 3.5–5.1)
TCO2: 28 mmol/L (ref 0–100)

## 2010-07-24 LAB — URINALYSIS, ROUTINE W REFLEX MICROSCOPIC
Bilirubin Urine: NEGATIVE
Hgb urine dipstick: NEGATIVE
Specific Gravity, Urine: 1.019 (ref 1.005–1.030)
Urine Glucose, Fasting: NEGATIVE mg/dL
Urobilinogen, UA: 1 mg/dL (ref 0.0–1.0)

## 2010-07-25 ENCOUNTER — Inpatient Hospital Stay: Admission: RE | Admit: 2010-07-25 | Payer: Self-pay | Source: Ambulatory Visit

## 2010-07-25 LAB — CK TOTAL AND CKMB (NOT AT ARMC)
CK, MB: 1.4 ng/mL (ref 0.3–4.0)
CK, MB: 3.2 ng/mL (ref 0.3–4.0)
Total CK: 67 U/L (ref 7–232)
Total CK: 104 U/L (ref 7–232)

## 2010-07-25 LAB — COMPREHENSIVE METABOLIC PANEL
ALT: 17 U/L (ref 0–53)
AST: 31 U/L (ref 0–37)
Alkaline Phosphatase: 55 U/L (ref 39–117)
CO2: 24 mEq/L (ref 19–32)
Chloride: 103 mEq/L (ref 96–112)
GFR calc non Af Amer: 41 mL/min — ABNORMAL LOW (ref >60)
Glucose, Bld: 150 mg/dL — ABNORMAL HIGH (ref 70–99)
Potassium: 4 mEq/L (ref 3.5–5.1)
Sodium: 139 mEq/L (ref 135–145)

## 2010-07-25 LAB — GLUCOSE, CAPILLARY: Glucose-Capillary: 117 mg/dL — ABNORMAL HIGH (ref 70–99)

## 2010-07-25 LAB — CBC
HCT: 35 % — ABNORMAL LOW (ref 39.0–52.0)
Hemoglobin: 11.6 g/dL — ABNORMAL LOW (ref 13.0–17.0)
MCH: 29.1 pg (ref 26.0–34.0)
MCV: 87.9 fL (ref 78.0–100.0)
Platelets: 347 10*3/uL (ref 150–400)
RBC: 3.98 MIL/uL — ABNORMAL LOW (ref 4.22–5.81)
WBC: 6.4 10*3/uL (ref 4.0–10.5)

## 2010-07-25 LAB — LIPID PANEL: HDL: 30 mg/dL — ABNORMAL LOW (ref >39)

## 2010-07-25 LAB — TROPONIN I

## 2010-07-25 LAB — MAGNESIUM: Magnesium: 1.9 mg/dL (ref 1.5–2.5)

## 2010-07-26 LAB — DIFFERENTIAL
Basophils Relative: 1 % (ref 0–1)
Eosinophils Absolute: 0.4 10*3/uL (ref 0.0–0.7)
Lymphs Abs: 1.4 10*3/uL (ref 0.7–4.0)
Monocytes Absolute: 0.7 10*3/uL (ref 0.1–1.0)
Monocytes Relative: 12 % (ref 3–12)
Neutro Abs: 3.1 10*3/uL (ref 1.7–7.7)
Neutrophils Relative %: 55 % (ref 43–77)

## 2010-07-26 LAB — SEDIMENTATION RATE: Sed Rate: 120 mm/hr — ABNORMAL HIGH (ref 0–16)

## 2010-07-26 LAB — BASIC METABOLIC PANEL
CO2: 27 mEq/L (ref 19–32)
Chloride: 106 mEq/L (ref 96–112)
Creatinine, Ser: 1.46 mg/dL (ref 0.4–1.5)
GFR calc Af Amer: 55 mL/min — ABNORMAL LOW (ref >60)
Glucose, Bld: 115 mg/dL — ABNORMAL HIGH (ref 70–99)
Sodium: 139 mEq/L (ref 135–145)

## 2010-07-26 LAB — CBC
HCT: 34.5 % — ABNORMAL LOW (ref 39.0–52.0)
Hemoglobin: 10.9 g/dL — ABNORMAL LOW (ref 13.0–17.0)
MCH: 28.2 pg (ref 26.0–34.0)
RBC: 3.86 MIL/uL — ABNORMAL LOW (ref 4.22–5.81)

## 2010-07-26 LAB — T3, FREE: T3, Free: 2 pg/mL — ABNORMAL LOW (ref 2.3–4.2)

## 2010-07-26 LAB — T4, FREE: Free T4: 1.02 ng/dL (ref 0.80–1.80)

## 2010-07-26 LAB — PHOSPHORUS: Phosphorus: 2.9 mg/dL (ref 2.3–4.6)

## 2010-07-27 DIAGNOSIS — R42 Dizziness and giddiness: Secondary | ICD-10-CM

## 2010-07-27 LAB — CBC
MCH: 28.2 pg (ref 26.0–34.0)
MCV: 89.3 fL (ref 78.0–100.0)
Platelets: 287 10*3/uL (ref 150–400)
RDW: 13.6 % (ref 11.5–15.5)

## 2010-07-27 LAB — BASIC METABOLIC PANEL
BUN: 13 mg/dL (ref 6–23)
Calcium: 8.5 mg/dL (ref 8.4–10.5)
Creatinine, Ser: 1.34 mg/dL (ref 0.4–1.5)
GFR calc non Af Amer: 51 mL/min — ABNORMAL LOW (ref >60)

## 2010-07-27 LAB — FOLATE RBC: RBC Folate: 1230 ng/mL — ABNORMAL HIGH (ref 180–600)

## 2010-07-27 NOTE — Letter (Signed)
Summary: Appointment - [facility] [place], [place]  [address] [place]   [place], [place] [address]   Phone: [phone]  Fax: [phone]     July 18, 2010 MRN: [id number]   [place],   [address]   Dear Mr. INDA,  [facility] records indicate that it is time to schedule a follow-up appointment.Dr.Klein recommended that you follow up with [place] in February. It is very important that we reach you to schedule this appointment. We look forward to participating in your health care needs. Please contact us at the number listed above at your earliest convenience to schedule your appointment.  If you are unable to make an appointment at this time, give [place] a call so we can update our records.     Sincerely,   Public relations account executive

## 2010-08-05 NOTE — Discharge Summary (Signed)
[name], [name]                 ACCOUNT NO.:  [id number]  MEDICAL RECORD NO.:  [id number]           PATIENT TYPE:  I  LOCATION:  [id number]                         FACILITY:  [facility]  PHYSICIAN:  Sherryl Manges, M.D.  DATE OF BIRTH:  1922/04/17  DATE OF ADMISSION:  07/24/2010 DATE OF DISCHARGE:  07/27/2010                              DISCHARGE SUMMARY   PRIMARY MD:  Chipper Herb, MD  PRIMARY CARDIOLOGIST:  Minus Breeding, MD, [facility]  DISCHARGE DIAGNOSES: 1. Dehydration/acute renal insufficiency. 2. Postural dizziness, secondary to dehydration/acute renal     insufficiency. 3. Chronic kidney disease. 4. Malar region facial cellulitis. 5. Dysthyroidism. 6. Gait instability. 7. Coronary artery disease, status post stent. 8. History of complete heart block, status post permanent pacer. 9. Dyslipidemia. 10.Left eye macular degeneration. 11.History of right eye enucleation post-trauma remotely.  DISCHARGE MEDICATIONS: 1. Clindamycin 300 mg p.o. q.i.d. for 5 days only. 2. Levothyroxine 175 mcg p.o. q.a.m. (the patient was on 150 mcg p.o.     q.a.m.). 3. Aspirin enteric-coated 81 mg p.o. q.a.m. 4. Chelated potassium 99 mg p.o. alternate days. 5. Chelated zinc 50 mg p.o. every other day. 6. Lipitor 20 mg p.o. q.a.m. 7. Lovaza 2 g p.o. b.i.d. 8. Meclizine 25 mg p.o. p.r.n. t.i.d. for dizziness. 9. Metoprolol succinate 50 mg p.o. q.a.m. 10.Therapeutic multivitamin 1 p.o. daily. 11.Nitroglycerin 0.4 mg SL p.r.n. q.5 minutes x3 doses for chest pain. 12.Plavix 75 mg p.o. q.a.m. 13.Refresh eye drops OTC 2-3 drops each eye t.i.d. p.r.n.  Note: Metoprolol tartrate has been discontinued, on neurology recommendstions, to minimize orthostasis.  PROCEDURES: 1. Head CT scan on July 24, 2010, this showed no acute finding.     There was atrophia and chronic microvascular ischemic change. 2. Bilateral carotid/vertebral artery duplex scan on July 27, 2010.  This showed antegrade  vertebral artery flow bilaterally.  No     ICA stenosis on the right, left ICA demonstrated 40-59% stenosis     low end of range. 3. A 2-D echocardiogram on July 27, 2010.  This showed septal     dyssynergy related to placing cavity size mildly dilated.     Estimated ejection fraction 55%.  Regional wall motion     abnormalities cannot be excluded.  This was typically limited     study.  The study is not technically sufficient to allow evaluation     of LV diastolic function, right ventricle was poorly visualized,     pacer wire noted in the right, ventricle systolic function was     normal.  CONSULTATIONS:  Dr. Asencion Partridge Dohmeier, neurologist.  ADMISSION HISTORY:  As per H and P notes of July 24, 2010, dictated by Dr. Gean Birchwood.  However, in brief, this is an 77 year old male, with known history of coronary artery disease, status post stent, history of complete heart block, status post permanent pacer, hypothyroidism, prostate carcinoma, left eye macular generation, history of right eye enucleation, status post trauma while in the [facility], status post previous left wrist trauma, dyslipidemia, presenting with complaints of dizziness of approximately 2 weeks duration. This was progressive,  and it became difficult to ambulate.  He also noticed some redness just below the left eye.  He was subsequently admitted for further evaluation, investigation, and management.  CLINICAL COURSE: 1. Dehydration/acute renal insufficiency.  The patient presented with     a BUN of 28, creatinine 1.9 consistent with dehydration.  He was     managed with intravenous fluid hydration by July 27, 2010, his     BUN had improved at 30 with a creatinine of 1.34, which we suspect     is his baseline.  2. Dizziness.  This is likely postural, secondary to dehydration and     orthostasis.  Following intravenous fluid hydration and improvement     in hydration status, the patient dizziness was  quickly ameliorated.     Per neurology recommendations, Metorolol tartrate has been changed      to Metoprolol succinate, to reduce the likelihood of postural hypotension.  3. Mild facial cellulitis.  The patient did complain of redness in the     malar region just below the left eye, which appeared to be malar     cellulitis.  He was commenced on a combination of intravenous     vancomycin and ciprofloxacin and by July 27, 2010, after 3 days     of antibiotic therapy inflammatory phenomena had completely     resolved.  He has been transitioned to oral clindamycin for further     5 days of treatment.  4. Dysthyroidism.  The patient has a known history of hypothyroidism,     on thyroxine replacement therapy i.e. 115 mcg of Synthroid daily.     His TSH during this hospitalization was markedly elevated at     12.061.  His thyroxine dosage has therefore been increased to 175     mcg p.o. daily.  We shall defer to the patient's primary MD to     recheck his TSH in 4-6 weeks.  5. Gait instability, this is chronic.  The patient was seen by     neurologist, Dr. Larey Seat on July 26, 2010.  She feels     that this may be associated with a peripheral neuropathy due to     hypothyroidism.  And she has recommended PT and OT.  6. Coronary artery disease.  The patient is status post stent and is     on a combination of Plavix and aspirin.  There were no problems     referable to this during the course of the patient's     hospitalization.  7. History of prostate cancer.  The patient had no symptoms of     prostatism during his hospitalization.  8. Dyslipidemia.  The patient's lipid profile was as follows, total     cholesterol 105, triglyceride 126, HDL 30, LDL 50, i.e. excellent     lipid profile, he has been reassured accordingly.  DISPOSITION:  The patient was on July 27, 2010, considered clinically stable for discharge, felt considerably better, had only minimal  dizziness, was able to ambulate without assistance.  He was therefore discharged accordingly.  ACTIVITY:  As tolerated.  Recommended to increase activity slowly otherwise, per PT/OT.  FOLLOWUP INSTRUCTIONS:  The patient is to follow up with his primary MD, Dr. Morrie Sheldon, in the coming week.  He is instructed to call for an appointment.  He has indicated that he would like to change his PMD and apparently has been recommended Dr. Asa Lente, telephone number [phone].  In addition,  the patient will follow up with his primary cardiologist Dr. Minus Breeding, for prior scheduled appointment.  SPECIAL INSTRUCTIONS:  Home Health PT and OT has been arranged.     Sherryl Manges, M.D.     CO/MEDQ  D:  07/27/2010  T:  07/28/2010  Job:  [id number]  cc:   Chipper Herb, M.D. Valerie A. Asa Lente, MD  Electronically Signed by Sherryl Manges M.D. on 08/05/2010 12:43:29 PM

## 2010-08-07 NOTE — H&P (Signed)
[name], [name]                 ACCOUNT NO.:  [id number]  MEDICAL RECORD NO.:  [id number]           PATIENT TYPE:  E  LOCATION:  [facility]                         FACILITY:  [facility]  PHYSICIAN:  Rise Patience, MDDATE OF BIRTH:  December 18, 1921  DATE OF ADMISSION:  07/24/2010 DATE OF DISCHARGE:                             HISTORY & PHYSICAL   PRIMARY CARE PHYSICIAN:  Chipper Herb, MD  CHIEF COMPLAINT:  Dizziness.  HISTORY OF PRESENT ILLNESS:  An 77 year old male with known history of complete heart block status post pacemaker placement, history of CAD status post stenting, history of CA of prostate, dyslipidemia, hypothyroidism, right eye enucleation after trauma while being in [place], has been experiencing dizziness.  The patient has been having dizziness for almost 2 weeks now, which is progressing, getting worse, and he is not able to ambulate.  He is a person who usually is able to do things by himself and this is a big change for him as per the family.  The patient did not lose consciousness, did not have any headache, did not have any visual symptoms.  There is some erythema developing on the left eye which he states is new and has been similar to which he had developed cellulitis last year.  The patient denies any fever or chills. Denies any chest pain, shortness of breath, nausea, vomiting, or abdominal pain.  Denies any dysuria, discharge, or diarrhea.  In the ER, the patient had a CT of head which is negative.  The patient will be admitted for further workup for his dizziness and difficulty ambulating.  PAST MEDICAL HISTORY: 1. History of complete heart block status post pacemaker placement. 2. History of CAD status post stenting. 3. History of hypothyroidism. 4. History of CA of prostate. 5. History of macular degeneration. 6. History of right eye enucleation status post trauma while in [employer].  PAST SURGICAL HISTORY:  Left wrist status post trauma, right eye  with facial reconstruction, pacemaker placement and stent placement.  FAMILY HISTORY:  Noncontributory.  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  Smokes cigarettes for many years, quit in 2003.  Denies any alcohol or drug abuse.  MEDICATIONS PRIOR TO ADMISSION: 1. Meclizine 25 mg p.o. t.i.d. p.r.n. which he just started yesterday     for dizziness. 2. Refresh eye drops. 3. Multivitamins. 4. Chelated potassium. 5. Chelated zinc. 6. Lovaza 2 capsules daily. 7. Nitroglycerin p.r.n. 8. Lipitor 20 mg daily. 9. Metoprolol 50 mg daily. 10.Levothyroxine 150 mcg daily. 11.Plavix. 12.Aspirin.  ALLERGIES:  No known drug allergies.  REVIEW OF SYSTEMS:  As per history of present illness, nothing else significant.  PHYSICAL EXAMINATION:  GENERAL:  The patient is examined at bedside not in acute distress. VITAL SIGNS:  Blood pressure is 171/90, pulse is 86 per minute, temperature is 97.5, respirations 18, and O2 saturations 96%. HEENT:  The patient does not have a right eye.  There is no active discharge from the right eye.  There is left-sided infraorbital erythema extending from his infraorbital area up to his left cheeks.  There is  no tenderness.  There is no facial asymmetry.  Tongue is midline. NECK:  There is no neck rigidity. CHEST:  Bilateral air entry present.  No rhonchi or crepitation. HEART:  S1 and S2 heard. ABDOMEN:  Soft and nontender.  Bowel sounds heard. CNS:  The patient is alert, awake, and oriented to time, place, and person.  He is able to move upper and lower extremities.  When trying to make the patient walk, the patient is not able to take one step because the patient is intensely dizzy. EXTREMITIES:  Peripheral pulses are felt.  No edema.  LABORATORY DATA:  EKG shows paced rhythm.  CT of the head without contrast shows no acute findings, atrophy and chronic microvascular ischemic change.  CBC:  WBC is 7.8, hemoglobin is  12.9, hematocrit is 38, and platelets 382.  Basic metabolic panel:  Sodium [id number], potassium 4.1, chloride 102, glucose 125, BUN 28, and creatinine 1.9.  UA is negative for nitrites, small leukocytes, few squamous cells, wbc's 0 to 3, and bacteria rare.  ASSESSMENT: 1. Difficulty ambulating with dizziness. 2. Possible developing left facial cellulitis. 3. Chronic kidney disease. 4. History of coronary artery disease status post stenting. 5. History of complete heart block status post pacemaker placement. 6. History of hypothyroidism. 7. History of right eye enucleation. 8. History of hyperlipidemia.  PLAN: 1. At this time, we will admit the patient to telemetry. 2. For his dizziness, we will get PT/OT consult and also get opinion     from neurologist later in the day.  At this time as the patient has     pacemaker he cannot get MRI done and also the patient has chronic     kidney disease.  We cannot do any CT with contrast.  We will     continue with meclizine at this time.  If it does not work and if     there is no other cause found, probably we may have to add Ativan. 3. For his hypertension, CAD, and hyperlipidemia, we will continue his     present medication. 4. Further recommendation as condition evolves and based on tests     ordered. 5. The patient is a full code.     Rise Patience, MD     ANK/MEDQ  D:  07/25/2010  T:  07/25/2010  Job:  [id number]  cc:   Chipper Herb, M.D.  Electronically Signed by Gean Birchwood MD on 08/07/2010 04:55:38 PM

## 2010-08-10 ENCOUNTER — Encounter: Payer: Self-pay | Admitting: Internal Medicine

## 2010-08-10 DIAGNOSIS — I442 Atrioventricular block, complete: Secondary | ICD-10-CM

## 2010-08-29 NOTE — Cardiovascular Report (Signed)
Summary: TTM   TTM   Imported By: Sallee Provencal 08/21/2010 13:51:03  _____________________________________________________________________  External Attachment:    Type:   Image     Comment:   External Document

## 2010-08-30 ENCOUNTER — Encounter: Payer: Self-pay | Admitting: Internal Medicine

## 2010-08-31 LAB — LIPID PANEL
Cholesterol: 127 mg/dL (ref 0–200)
HDL: 27 mg/dL — ABNORMAL LOW (ref >39)
HDL: 33 mg/dL — ABNORMAL LOW (ref >39)
Total CHOL/HDL Ratio: 4.2 RATIO

## 2010-08-31 LAB — BASIC METABOLIC PANEL
BUN: 14 mg/dL (ref 6–23)
BUN: 24 mg/dL — ABNORMAL HIGH (ref 6–23)
CO2: 26 mEq/L (ref 19–32)
Calcium: 8.2 mg/dL — ABNORMAL LOW (ref 8.4–10.5)
Calcium: 8.2 mg/dL — ABNORMAL LOW (ref 8.4–10.5)
Chloride: 108 mEq/L (ref 96–112)
Creatinine, Ser: 1.63 mg/dL — ABNORMAL HIGH (ref 0.4–1.5)
Creatinine, Ser: 1.66 mg/dL — ABNORMAL HIGH (ref 0.4–1.5)
Creatinine, Ser: 1.9 mg/dL — ABNORMAL HIGH (ref 0.4–1.5)
GFR calc Af Amer: 48 mL/min — ABNORMAL LOW (ref >60)
GFR calc Af Amer: 49 mL/min — ABNORMAL LOW (ref >60)
GFR calc non Af Amer: 34 mL/min — ABNORMAL LOW (ref >60)
Glucose, Bld: 106 mg/dL — ABNORMAL HIGH (ref 70–99)
Potassium: 3.6 mEq/L (ref 3.5–5.1)

## 2010-08-31 LAB — COMPREHENSIVE METABOLIC PANEL
ALT: 18 U/L (ref 0–53)
BUN: 27 mg/dL — ABNORMAL HIGH (ref 6–23)
CO2: 24 mEq/L (ref 19–32)
Calcium: 9.2 mg/dL (ref 8.4–10.5)
Creatinine, Ser: 2.2 mg/dL — ABNORMAL HIGH (ref 0.4–1.5)
GFR calc non Af Amer: 29 mL/min — ABNORMAL LOW (ref >60)
Glucose, Bld: 156 mg/dL — ABNORMAL HIGH (ref 70–99)
Sodium: 136 mEq/L (ref 135–145)

## 2010-08-31 LAB — EYE CULTURE

## 2010-08-31 LAB — DIFFERENTIAL
Basophils Absolute: 0 10*3/uL (ref 0.0–0.1)
Basophils Relative: 0 % (ref 0–1)
Eosinophils Relative: 4 % (ref 0–5)
Monocytes Absolute: 1 10*3/uL (ref 0.1–1.0)
Neutro Abs: 3.7 10*3/uL (ref 1.7–7.7)

## 2010-08-31 LAB — CBC
Hemoglobin: 11.1 g/dL — ABNORMAL LOW (ref 13.0–17.0)
MCHC: 34.2 g/dL (ref 30.0–36.0)
RBC: 3.55 MIL/uL — ABNORMAL LOW (ref 4.22–5.81)

## 2010-08-31 LAB — GLUCOSE, CAPILLARY
Glucose-Capillary: 113 mg/dL — ABNORMAL HIGH (ref 70–99)
Glucose-Capillary: 113 mg/dL — ABNORMAL HIGH (ref 70–99)
Glucose-Capillary: 153 mg/dL — ABNORMAL HIGH (ref 70–99)
Glucose-Capillary: 90 mg/dL (ref 70–99)
Glucose-Capillary: 92 mg/dL (ref 70–99)

## 2010-08-31 LAB — POCT CARDIAC MARKERS: Troponin i, poc: <0.05 ng/mL (ref 0.00–0.09)

## 2010-08-31 LAB — TSH: TSH: 4.376 u[IU]/mL (ref 0.350–4.500)

## 2010-09-07 NOTE — Consult Note (Signed)
NAMEMORIS, BUCKER                 ACCOUNT NO.:  [id number]  MEDICAL RECORD NO.:  [id number]           PATIENT TYPE:  I  LOCATION:  6709                         FACILITY:  [facility]  PHYSICIAN:  Larey Seat, M.D.  DATE OF BIRTH:  Apr 17, 1922  DATE OF CONSULTATION:  07/26/2010 DATE OF DISCHARGE:                                CONSULTATION   TIME OF CONSULTATION:  4 p.m.  CHIEF COMPLAINT:  Dizziness.  CONSULTING PHYSICIANS:  [facility].  HISTORY OF PRESENT ILLNESS:  The patient is an 77 year old male with a history of myocardial infarction, hypertension, and diabetes, who presents with severe dizziness that has worsened over last 2 weeks.  The patient has slight woozy feeling for last 6 months upon getting up which subacutely worsened over the last 2 weeks.  The patient reports that his dizziness is worse at the time of any change in his position from sitting to standing as well as from sleeping to sitting.  The patient also reports that his dizziness is present if he rapidly moves his head. The patient denies any acute vision changes.  The patient's redness and swelling on the left eye from previous cellulitis has decreased.  The patient does not report any fevers, nausea, or vomiting at this time. The patient reports of decreased p.o. intake over the last few days of clear liquids and he prefers to drink sodas.  The patient's blood pressure medication was changed about 6 months ago and was decreased in frequency and dosage at that time.  The patient has not been taking any new medications at this time.  The patient did not experience any such episodes in the past.  PAST MEDICAL HISTORY: 1. Hypertension. 2. High cholesterol. 3. CVA. 4. Myocardial infarction. 5. History of prostate cancer treated with radiation. 6. The patient has a pacemaker implant.  MEDICATIONS: 1. The patient is now on meclizine for the last few days. 2. Plavix. 3. Aspirin. 4.  Multivitamin. 5. Potassium. 6. Zinc. 7. Lovaza. 8. Nitroglycerin as needed. 9. Lipitor. 10.Metoprolol 50 mg once a day. 11.Levothyroxine.  ALLERGIES:  No allergies known.  FAMILY MEDICAL HISTORY:  Noncontributory to the present illness.  SOCIAL HISTORY:  The patient lives by himself and was functional 3 weeks ago independently.  He is a fairly active 77 year old with interest in gardening.  He used to smoke until he stopped a few years ago.  Does not consume any alcohol or illicit drugs.  REVIEW OF SYMPTOMS:  As per HPI.  PHYSICAL EXAMINATION:  VITAL SIGNS:  Blood pressure 145/74, pulse is 67, respirations 20, 96% oxygen saturation on room air, temperature is 97.5. CENTRAL NERVOUS SYSTEM:  Mental status is alert and oriented.  He is able to carry out two-step commands.  Cranial nerve exam; PERRLA, conjugate gaze, EOMI.  Face is symmetrical with uvula in midline and the tongue is in the midline.  There is no dysarthria or slurred speech. There is no facial droop.  Sensation is intact in V1 to V3 distribution. The shoulder shrug and the head turning are normal.  Coordination is normal on finger-to-nose and heel-to-shin exam.  Gait is slow.  He does have essential tremor and slight dizziness at walking and needs some support in the form of cane.  Motor exam 5/5 strength in all extremities.  Deep tendon reflexes are decreased in all extremities, it is 2/5.  There is no drift.  Sensation is intact except in the lower extremities which has a decreased sensation of touch and two-point discrimination. PULMONARY:  Clear to auscultation. CARDIOVASCULAR:  Regular rate and rhythm.  No murmurs. NECK:  No bruits and it is supple.  LABORATORY DATA:  Sodium 139, potassium 4.0, chloride 106, bicarb 27, BUN is 18, creatinine is 1.46.  Hemoglobin is 10.9, white count of 5.6, platelets 323.  ANA is 54.  Free T4 is 1.0.  ESR is 120.  CRP was 2.3.  IMAGING TESTING:  A CT head has no acute  changes of stroke.  ASSESSMENT AND PLAN:  The patient is an 77 year old man with chronic mild dizziness which has now acutely worsened over the last 2 weeks. The patient had an episode of eye cellulitis which was treated with antibiotics.  The patient does not seem to have any symptoms of stroke at this time.  The patient's CT scan is negative for stroke.  The patient's dizziness is probably multifactorial, but mainly related to the orthostatic component.  The patient's symptoms are worse after getting a blood pressure medication in the morning.  The patient's gait difficulties are chronic and would likely benefit from rehab with physical therapist as well as occupational therapist.  The patient has some neuropathy which likely is from metabolic disorders as well as hypothyroidism.  We recommended the patient continue PT/OT evaluation at home and the thyroid function be monitored on an outpatient basis.  The patient's metoprolol should be changed from immediate release in the morning to sustained release prior to going to bed, thus the symptoms of orthostasis be minimized.  The patient should follow up outpatient with Dr. Asa Lente whose phone number is [phone] as the patient would like to change his PCP.  Thank you for consultation.     Pershing Cox, MD PhD   ______________________________ Larey Seat, M.D.    RS/MEDQ  D:  07/26/2010  T:  07/27/2010  Job:  [id number]  cc:   Mateo Flow A. Asa Lente, MD  Electronically Signed by Pershing Cox MD PHD on 07/31/2010 [id number] PM Electronically Signed by Larey Seat M.D. on 09/07/2010 12:56:43 PM

## 2010-09-12 ENCOUNTER — Ambulatory Visit (INDEPENDENT_AMBULATORY_CARE_PROVIDER_SITE_OTHER): Payer: Medicare Other | Admitting: Internal Medicine

## 2010-09-12 ENCOUNTER — Encounter: Payer: Self-pay | Admitting: Internal Medicine

## 2010-09-12 DIAGNOSIS — I442 Atrioventricular block, complete: Secondary | ICD-10-CM

## 2010-09-12 DIAGNOSIS — I2510 Atherosclerotic heart disease of native coronary artery without angina pectoris: Secondary | ICD-10-CM

## 2010-09-12 DIAGNOSIS — Z950 Presence of cardiac pacemaker: Secondary | ICD-10-CM

## 2010-09-12 DIAGNOSIS — I4891 Unspecified atrial fibrillation: Secondary | ICD-10-CM

## 2010-09-12 NOTE — Progress Notes (Signed)
HPI  [name] is a 77 y.o. maleseen in  followup for a pacemaker implanted for CHB jan 2009.  He aslo has CAD with a chronically occluded right coronary artery. He had an LAD with 90% stenosis at the take off of the first diagonal.  He had a drug-eluting stent placed by Dr. Burt Knack .  Myoview scan done last year was negative for ischemia;  There is concerns expressed by the patient and he was left with a Bill for  $4000 after Medicare Aid only 800 hours. The patient denies chest pain, shortness of breath, nocturnal dyspnea, orthopnea or peripheral edema.  There have been no palpitations, lightheadedness or syncope.      Past Medical History  Diagnosis Date  . Cancer     prostate ca with radiation in 2005  . Hypertension   . Cellulitis of face feb 2011    prostetic rt eye, cellulitis of rt side of face  . CAD (coronary artery disease)     (non Q wave MI in Jan 2009. He had chronically ocluded right coronary artery. He had a LAD w/90% stenosis at the take off of the first diagona. He had a drug-eluding stent palced by Dr Burt Knack)  . Complete heart block     Zephyr dual chamber pacemaker per Dr Caryl Comes  . Dyslipidemia   . Hypothyroid   . Macular degeneration   . Blindness of right eye     Related to trauma in [employer]    No past surgical history on file.  Current Outpatient Prescriptions  Medication Sig Dispense Refill  . aspirin 81 MG tablet Take 81 mg by mouth daily.        Marland Kitchen atorvastatin (LIPITOR) 20 MG tablet Take 20 mg by mouth at bedtime.        . Chelated Zinc 50 MG TABS Take by mouth every other day.        . clopidogrel (PLAVIX) 75 MG tablet Take 75 mg by mouth daily.        Marland Kitchen levothyroxine (SYNTHROID, LEVOTHROID) 175 MCG tablet Take 175 mcg by mouth daily.        . metoprolol (TOPROL-XL) 50 MG 24 hr tablet Take 50 mg by mouth daily.        . nitroGLYCERIN (NITROSTAT) 0.4 MG SL tablet Place 0.4 mg under the tongue as needed.        Marland Kitchen omega-3 acid ethyl esters (LOVAZA) 1 G  capsule Take 2 g by mouth 2 (two) times daily.        . Multiple Vitamins-Minerals (MULTIVITAMIN,TX-MINERALS) tablet Take 1 tablet by mouth daily.        Marland Kitchen DISCONTD: aspirin 325 MG tablet Take 325 mg by mouth daily.       Marland Kitchen DISCONTD: Fenofibrate (LIPOFEN) 150 MG CAPS Take by mouth daily.        Marland Kitchen DISCONTD: hydrochlorothiazide 25 MG tablet Take 12.5 mg by mouth daily.        Marland Kitchen DISCONTD: levothyroxine (SYNTHROID, LEVOTHROID) 150 MCG tablet Take 150 mcg by mouth daily.       Marland Kitchen DISCONTD: metoprolol (LOPRESSOR) 50 MG tablet Take 50 mg by mouth daily.         No Known Allergies  Review of Systems negative except from HPI and PMH  Physical Exam Well developed and well nourished in no acute distress HENT normal; Except nucleated right eye E scleral and icterus clear Neck Supple JVP flat; carotids brisk and full Clear to ausculation Regular  rate and rhythm, no murmurs gallops or rub Soft with active bowel sounds No clubbing cyanosis and edema Alert and oriented, grossly normal motor and sensory function Skin Warm and Dry     Assessment and  Plan

## 2010-09-12 NOTE — Assessment & Plan Note (Signed)
The patient's device was interrogated.  The information was reviewed. No changes were made in the programming.    

## 2010-09-12 NOTE — Patient Instructions (Signed)
Your physician recommends that you schedule a follow-up appointment in: YEAR WITH DR KLEIN  Your physician recommends that you continue on your current medications as directed. Please refer to the Current Medication list given to you today. 

## 2010-09-12 NOTE — Assessment & Plan Note (Signed)
Stable on current medications continue antiplatelet therapy

## 2010-09-12 NOTE — Assessment & Plan Note (Signed)
Stable post pacemaker

## 2010-09-12 NOTE — Assessment & Plan Note (Signed)
Patient was identified by pacemaker to have atrial fibrillation lasting up to 5 hours. Given his need for double platelet therapy, I don't think that the threshold for risk-benefit in favor of oral anticoagulation has been reached. I reviewed this with the family

## 2010-09-18 ENCOUNTER — Ambulatory Visit: Payer: Medicare Other | Admitting: Internal Medicine

## 2010-10-24 NOTE — Assessment & Plan Note (Signed)
[facility] OFFICE NOTE   NAME:TUTTLEKnowledge, Baquero                        MRN:          [id number]  DATE:08/17/2008                            DOB:          03-26-1922    Mr. Lyga is seen in followup for pacemaker implanted for complete  heart block.  He is doing well without symptoms of syncope.  He has a  history of coronary artery disease which is stable.  He has had no  problems with chest pain.   CURRENT MEDICATIONS:  1. Lisinopril HCT 10/12.5.  2. Lipitor 20.  3. Plavix 75.  4. Metoprolol 50.  5. Aspirin.  6. Levothyroxine 150.  7. Lovaza.  8. Gemfibrozil.   PHYSICAL EXAMINATION:  VITAL SIGNS:  His blood pressure was 123/59 with  a pulse of 65, the weight was 226 which is down 12 pounds in the last 2  months.  LUNGS:  Clear.  NECK:  Neck veins were flat.  HEART:  Sounds were regular.  Extremities:  Trace edema.   Interrogation of his [employer] pulse generator demonstrates no intrinsic  R-wave.  The impedance was 395.  The threshold was 2.8 at 0.4.  The  atrial amplitude was 4.7 with impedance of 463.  The device was  reprogrammed to inactivate polarity switch given the patient's pacer  dependence.   IMPRESSION:  1. Complete heart block.  2. Status post pacer for the above.  3. Coronary artery disease with prior left anterior descending      stenting and total right without symptoms.  4. Blindness.   Mr. Routt is doing quite well at this time.  The story that came out  today was about his granddaughter who died of an aortic dissection  around the time of the delivery of her child.  It  was not clear what the cause of that dissection was.  We will plan to  see if we can get some assistance in genetic understanding of this.     Deboraha Sprang, MD, [facility]  Electronically Signed    SCK/MedQ  DD: 08/17/2008  DT: 08/18/2008  Job #: [id number]

## 2010-10-24 NOTE — Cardiovascular Report (Signed)
NAMEMARCQUEZ, VIZZI                 ACCOUNT NO.:  [id number]   MEDICAL RECORD NO.:  [id number]          PATIENT TYPE:  INP   LOCATION:  2807                         FACILITY:  [facility]   PHYSICIAN:  Juanda Bond. Burt Knack, MD  DATE OF BIRTH:  [birth year]   DATE OF PROCEDURE:  07/04/2007  DATE OF DISCHARGE:                            CARDIAC CATHETERIZATION   PROCEDURE:  Left heart catheterization, selective coronary angiography,  left ventricular angiography, PTCA and stenting of the proximal LAD,  Angio-Seal of the right femoral artery.   INDICATION:  [name] is an 78 year old gentleman who presented with  unstable angina.  He also had marked bradycardia with second-degree  Wenckebach heart block.  His heart rate has been in the 30s.  He has no  cardiac history.  His initial cardiac markers were negative.  I elected  to proceed with cardiac catheterization to rule out obstructive CAD as a  potential etiology of his dysrhythmia.   The risks and indications of the procedure were reviewed with the  patient.  Informed consent was obtained.  The right groin was prepped,  draped, anesthetized with 1% lidocaine.  Using modified Seldinger  technique, a 6-French sheath was placed in the right femoral artery.  Standard 6-French Judkins catheters were used for coronary angiography.  An angled pigtail catheter was used for left ventriculography.  A  pullback across the aortic valve was recorded.   The patient has an occluded right coronary artery.  The appearance is  that of a chronic occlusion.  There is also an ulcerated-appearing  plaque in the proximal LAD with a 90% lesion there.  His left  ventricular function is preserved.  I elected to intervene on the LAD  based on severe stenosis in that region.  Since his right coronary  artery is collateralized from the left, it is possible that this has  played a role in his heart block as well.   Angiomax was used for anticoagulation.  An XB LAD 4-cm  guide catheter  was used.  Once therapeutic ACT was achieved, a Cougar guidewire was  passed beyond the area of severe stenosis without much difficulty.  The  lesion was predilated with a 2.5 x 15 mm Maverick balloon to 10  atmospheres.  The balloon did not appear well expanded.  I elected to  dilate the vessel with a Quantum Maverick noncompliant balloon.  This  was taken to 10 atmospheres and was better expanded.  There was a large  diagonal branch arising from this area with an ostial stenosis as well.  There was TIMI III flow in that vessel.  At that point, I proceeded with  stenting the proximal LAD.  A 3.5 x 18 mm Promus stent was used.  The  stent was deployed at burst pressure, which was 16 atmospheres.  The  patient tolerated that well,  and the stent appeared well expanded.  The  diagonal remained open.  I postdilated the stent with a 4.0 x 15 mm  Quantum Maverick balloon to 18 atmospheres on 2 inflations.  The stent  again appeared well expanded,  and there was TIMI III flow throughout the  LAD.  The diagonal ostium was compromised, but there was TIMI III flow,  and I elected not to intervene on that vessel.  The patient tolerated  the entire procedure well.  An Angio-Seal was used to seal the femoral  arteriotomy.   FINDINGS:  Aortic pressure 183/69 with a mean of 104, left ventricular  pressure 181/29.   The left mainstem is moderately calcified.  It bifurcates into the LAD  and left circumflex.  The mainstem has a 40% distal stenosis but has no  significant disease.   The LAD is moderately calcified throughout its proximal portion.  There  is a severe eccentric stenosis in the proximal LAD.  This is at the  origin of the first diagonal, which is a large vessel.  The first  diagonal has an 80% ostial stenosis.  The remaining portions of the mid  and distal LAD have no significant angiographic stenosis.  There is mild-  to-moderate diffuse disease throughout.  There is also  a very large  second diagonal branch of the LAD that branches itself into twin  vessels.   The left circumflex is widely patent.  There is mild nonobstructive  plaque in the first OM branch, which is a large vessel.  The AV groove  circumflex beyond the OM also has nonobstructive plaque, and then there  is a smaller second OM.   The right coronary artery is occluded in its most proximal segment.  The  distal right coronary artery is collateralized from the left.  There is  heavy calcification throughout the RCA.   Left ventriculography shows normal LV function with an LVEF of 55%.   ASSESSMENT:  1. Severe proximal left anterior descending stenosis.  2. Chronic right coronary artery occlusion, collateralized from the      left.  3. Nonobstructive left circumflex stenosis.  4. Normal left ventricular function.  5. Successful percutaneous coronary intervention of the proximal left      anterior descending with a Promus drug-eluting stent.   DISCUSSION:  Mr. Shryock should be continued on standard post-PCI medical  therapy,  which will include aspirin and Plavix for 1 year.  I would  avoid all AV nodal blocking agents in the setting of his heart block.  I  think we could watch his heart rhythm and see if he has any improvement  in his heart rate.  He has remained hemodynamically stable throughout  with a systolic blood pressure in the 150 to 160 range and is otherwise  tolerating his bradycardia reasonably well at present.  He may  ultimately require permanent pacemaker.      Juanda Bond. Burt Knack, MD  Electronically Signed     MDC/MEDQ  D:  07/04/2007  T:  07/05/2007  Job:  [id number]

## 2010-10-24 NOTE — Assessment & Plan Note (Signed)
[facility] OFFICE NOTE   NAME:TUTTLEJemir, Soergel                        MRN:          [id number]  DATE:10/14/2007                            DOB:          [birth year]    Mr. Coffing  is seen following pacemaker implantation in January for high-  grade heart block, now complete heart block.  He is feeling quite well  without complaints of chest pain or shortness of breath.   MEDICATIONS:  His medications include lisinopril, Lipitor,  levothyroxine, and metoprolol.   PHYSICAL EXAMINATION:  VITAL SIGNS:  blood pressure today was 146/66  with a pulse of 59.  LUNGS:  Clear.  CARDIAC:  The heart sounds were regular.  EXTREMITIES:  Without edema.   Interrogation of his [employer] Zephyr pulse generator demonstrates a P-  wave of 2.9 with impedance of 471, a threshold 0.75 at 0.4. The R-wave  of 9.4, impedance of 352 with threshold 0.75 at 0.4.  There are multiple  modes with episodes of normal which almost certainly result from atrial  far-field oversensing as these last only seconds.   IMPRESSION:  1. Complete heart block.  2. Status post pacer for the above.  3. Coronary artery disease with prior left anterior descending      stenting and totally occluded right.  4. Blindness.  5. Atrial far-field events.   Mr. Tindal is stable.  Will see him again in nine months' time at which  time as he continues to have these far-field things  we will reprogram  his atrial sensitivity.     Deboraha Sprang, MD, [facility]  Electronically Signed    SCK/MedQ  DD: 10/14/2007  DT: 10/14/2007  Job #: [id number]   cc:   Chipper Herb, M.D.

## 2010-10-24 NOTE — Consult Note (Signed)
[name], [name]                 ACCOUNT NO.:  [id number]   MEDICAL RECORD NO.:  [id number]          PATIENT TYPE:  INP   LOCATION:  2920                         FACILITY:  [facility]   PHYSICIAN:  Deboraha Sprang, MD, FACCDATE OF BIRTH:  [birth year]   DATE OF CONSULTATION:  07/08/2007  DATE OF DISCHARGE:                                 CONSULTATION   This consult is seen at the request of Dr. Sherren Mocha for concerns  regarding bradycardia and intermittent complete heart block.   [name] is an 77 year old gentleman who presented to the hospital  with chest pain.  He was found to be significantly bradycardic.  He  underwent catheterization, demonstrating a totaled right and a high-  grade LAD lesion, which was stented with a drug-eluting stent with good  results.  Normal LV function was noted.   Following revascularization, he continued to have problems with his  conduction system with frequent episodes of second-degree AV block,  profound first-degree AV block, and intermittent complete heart block  albeit with a narrow QRS.   The patient prior to admission was modestly symptomatic with exercise  intolerance.  He did have problems with lightheadedness, but this was  mostly orthostatic in nature.  He denied prior syncope.   His past medical history is notable for, a) hypertension, b)  dyslipidemia, c) treated hypothyroidism, d) prostate cancer, status post  radiation therapy, e) macular degeneration and subsequent blindness, f)  right eye enucleation following trauma in the [employer], g) guaiac- positive  stool and a normal GI evaluation.   Past surgical history is notable for chronic right wrist trauma.   MEDICATIONS:  Included Zocor, levothyroxine 175 mcg, Plavix 75, and a  variety of nutraceuticals.   He has no known drug allergies.   SOCIAL HISTORY:  He is married.  He is retired from [employer].  He does not use cigarettes, alcohol or recreational drugs,  having not smoked in about 6 years.   His review of systems was otherwise broadly negative as noted on the  intake sheet.   PHYSICAL EXAMINATION:  VITAL SIGNS:  On examination, his blood pressure  was 161/55 with a pulse of 53, ranging up to 88.  His respirations were  11 to 12.  He was afebrile.  HEENT:  Exam demonstrated no icterus, no xanthomata.  Eyes:  Right eye  was enucleated.  NECK:  His neck veins were flat.  His carotids were brisk and full  bilaterally without bruits.  BACK:  Without kyphosis or scoliosis.  LUNGS:  Clear.  HEART:  Heart sounds were regular without murmurs or gallops.  The  rhythm was somewhat irregular.  ABDOMEN:  Soft with active bowel sounds.  EXTREMITIES:  Femoral pulses were trace.  Distal pulses were intact.  There was no clubbing, cyanosis or edema.  NEUROLOGICAL:  Exam was grossly normal apart from vision.   Electrocardiogram dated 25 January demonstrated sinus rhythm at 68 with  intervals of __________  .08/.42.  The P-R intervals ranged from 240  milliseconds to approximately 500 milliseconds.   Telemetry demonstrated  intermittent complete heart block with a narrow  QRS.   Other laboratories were unrevealing apart from a mild anemia.   IMPRESSION:  1. Intermittent complete heart block.  2. Second-degree atrioventricular block type 1 with first-degree      atrioventricular block.  3. Ischemic heart disease.      a.     Status post left anterior descending percutaneous coronary       intervention with chronically totaled right and left-right       collaterals.      b.     Normal left ventricular function.      c.     Drug-eluting stent.  4. Hypertension.  5. Orthostatic lightheadedness.   [name] has high-grade conduction system disease.  Although it sounds  like most of it is in the AV node with his narrow QRS escape even in the  setting of complete heart block, pacing is a two-way indication in this  situation and likely will  benefit his exercise intolerance.   His lightheadedness sounds like it is primarily orthostatic in nature,  and this is not surprising in the context of his longstanding  hypertension.  Care will be needed in making sure that this does not  become symptomatic.   Treatment options for his bradycardia are primarily related to pacing.  It would also allow the subsequent use of beta blocker therapy, which in  the context of his coronary disease would be clearly indicated.  He  understands the potential benefits, which are not clear entirely, but  that they are likely as well as potential risks, including not limited  to death, perforation, infection, and lead dislodgement.  He understands  these risks and benefits and would like to proceed.      Deboraha Sprang, MD, [facility]  Electronically Signed     SCK/MEDQ  D:  07/08/2007  T:  07/08/2007  Job:  [id number]

## 2010-10-24 NOTE — Assessment & Plan Note (Signed)
[facility]                            CARDIOLOGY OFFICE NOTE   NAME:TUTTLEVir, Heeb                        MRN:          [id number]  DATE:07/30/2007                            DOB:          20-Jun-1921    PRIMARY CARE PHYSICIAN:  Chipper Herb, M.D.   REASON FOR PRESENTATION:  Evaluate patient with coronary disease and  complete heart block.   HISTORY OF PRESENT ILLNESS:  The the patient is a very pleasant 77-year-  old gentleman who was admitted in January with sudden onset chest pain  and a non-Q-wave myocardial infarction.  He was found on catheterization  to have a chronically occluded right coronary artery.  The LAD had a 90%  stenosis at the takeoff first diagonal.  He had a drug-eluting stent  placed by Dr. Burt Knack.  He subsequently had a Zephyr dual-chamber  pacemaker by Dr. Caryl Comes for complete heart block.   Since that time, he has done well from a cardiovascular standpoint.  He  denies any chest pressure, neck or arm discomfort.  He has no  significant shortness of breath.  Denies any PND or orthopnea.  Has had  no palpitations, pre-syncope or syncope.  He is legally blind and so has  a relatively minimal activity level, but this has not changed.   PAST MEDICAL HISTORY:  1. Hypertension.  2. Dyslipidemia.  3. Hypothyroidism.  4. Prostate cancer.  5. Macular degeneration.  6. Blindness in the right eye related to trauma in the [facility].  7. Complete heart block status post pacemaker placement as above.  8. Coronary artery disease as above.   ALLERGIES:  None.   MEDICATIONS:  1. Zinc.  2. Fish oil.  3. Potassium.  4. Fosinopril HCT.  5. Lipitor 20 mg daily.  6. Levothyroxine 175 mcg daily.  7. Aspirin 81 mg daily.  8. Plavix 75 mg daily.  9. Metoprolol 50 mg daily.   REVIEW OF SYSTEMS:  As stated in the HPI and otherwise negative for  other systems.   PHYSICAL EXAMINATION:  The patient is in no distress.  Blood pressure  138/76, heart rate 78 and regular.  HEENT:  Eyelids unremarkable, pupils are not round, reactive, he has had  trauma to the right eye, fundi are not visualized, oral mucosa  unremarkable.  NECK:  No jugular venous distention at 45 degrees. Carotid upstroke  brisk and symmetric, no bruits, no thyromegaly.  LYMPHATICS:  no cervical, axillary, inguinal adenopathy.  LUNGS:  Clear to auscultation bilaterally.  BACK:  No costovertebral test.  CHEST:  Healing left upper pacemaker pocket without erythema, exudate or  mass.  HEART:  PMI not displaced or sustained, S1-S2 within normal limits, no  S3, no S4, no clicks, no rubs, no murmurs.  ABDOMEN:  Obese, positive bowel sounds normal in frequency and pitch. No  bruits, rebound, guarding or midline pulsatile mass. No hepatomegaly or  splenomegaly.  SKIN:  No rashes, no nodules.  EXTREMITIES:  2+ pulses throughout, trace bilateral lower extremity  edema, no cyanosis or clubbing.  NEURO:  Oriented to place, time,  cranial nerves II-XII grossly intact, motor grossly intact.   EKG:  Sinus rhythm, rate 78, ventricular paced beats with 100% capture.   ASSESSMENT/PLAN:  1. Coronary disease.  The patient is having no new symptoms related to      this.  He will continue with aggressive risk reduction.  He      understands with his drug-eluting stent that he needs to remain on      the Plavix for at least 1 year and then we will discuss this      afterwards.  I will keep him on a low-dose of aspirin as well.  2. Complete heart block. Patient is status post pacemaker placement.      He has scheduled follow-up with Dr. Caryl Comes.  His wound looks fine.  3. Dyslipidemia per Dr. Laurance Flatten.  The goal will be an LDL less than 100      and HDL greater than 40.  4. Hypertension.  Blood pressure is well-controlled and he will      continue medications as listed.  5. Hypothyroidism per Dr. Laurance Flatten.  6. Follow-up.  I will see the patient again in 3 months or sooner if       needed.     Minus Breeding, MD, [facility]  Electronically Signed    JH/MedQ  DD: 07/30/2007  DT: 07/30/2007  Job #: [id number]   cc:   Chipper Herb, M.D.

## 2010-10-24 NOTE — H&P (Signed)
[name], [name]                 ACCOUNT NO.:  [id number]   MEDICAL RECORD NO.:  [id number]          PATIENT TYPE:  EMS   LOCATION:  MAJO                         FACILITY:  [facility]   PHYSICIAN:  Juanda Bond. Burt Knack, MD  DATE OF BIRTH:  07-23-1921   DATE OF ADMISSION:  07/04/2007  DATE OF DISCHARGE:                              HISTORY & PHYSICAL   PRIMARY CARDIOLOGIST:  He is new to [facility] cardiology being seen by Dr.  Burt Knack.   PRIMARY CARE Penina Reisner:  Dr. Redge Gainer at [facility]  practice.   GI:  Dr. Delfin Edis.   PATIENT IDENTIFICATION:  An 77 year old Caucasian male without prior  history of CAD who presented to the ED following an 8-hour history of  chest tightness and was found to be bradycardic Wenckebach.   PROBLEM LIST:  1. Unstable angina.  2. Bradycardia with Mobitz I heart block.  3. Hypertension.  4. Hyperlipidemia.  5. Remote tobacco abuse.      a.     Patient smoked for 65 years between 1 and 3 packs per day,       quitting in 2003.  6. Hypothyroidism.  7. Prostate CA status post radiation in 2005.  8. History of heme-positive stool July 2008 with normal GI evaluation.  9. History of left eye macular degeneration and subsequent leg      blindness.  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  11.History of right wrist trauma status post surgery in the 1980s.   HISTORY OF PRESENT ILLNESS:  An 23 old Caucasian male without prior  cardiac history. He is reasonably active at home without significant  limitations.  He was in his usual state of health until approximately  3:00 a.m. today when he awoke with 7/10 substernal chest tightness  without associated symptoms.  He took a few Rolaids, two baby aspirin,  and some more Rolaids without relief.  Several hours later he laid back  in his recliner and felt a little bit better and then his family called  EMS at around 11:00 a.m., approximately 8  hours after onset of symptoms.  By the time EMS arrived, he was feeling much better.  However, he was  noted to bradycardic with Mobitz I.  His blood pressure was stable and  he was taken to the [facility] ED.  Currently has no complaints and  vital signs are stable with the exception of his persistent bradycardia  with Wenckebach. He denies any presyncope or syncope.   ALLERGIES:  No known drug allergies.   HOME MEDICATIONS:  1. Fosinopril HCTZ 10/12.5 mg daily.  2. Lipitor 20 mg daily.  3. Synthroid 175 mcg daily.  4. Caltrate 600 mg plus D daily.  5. Multivitamin daily.  6. Fish oil 1200 mg daily.  7. Aspirin 81 mg daily.  8. Potassium over-the-counter.  9. Zinc a over-the-counter daily.   FAMILY HISTORY:  Mother died at old age at 95.  Father died of an MI at  22.  He had  two brothers and two sisters. One of the sisters had a  history of CAD.   SOCIAL HISTORY:  He lives in [place] with his wife.  He is retired from  [employer] .  He has three grown children.  He smoked  cigarettes for 65 years smoking between 1 and 3 packs per day and quit  in 2003.  He previously used alcohol heavily but quit in 1984.  He  denies any drug use.  He is active at home but does not routinely  exercise.   REVIEW OF SYSTEMS:  Positive for chest tightness as outlined in  the  HPI.  He also had a lot of belching in association with his chest  tightness.  He has chronic dyspnea on exertion.  He has chronic urinary  frequency and urgency as well as nocturia.  All other systems are  reviewed and negative.   PHYSICAL EXAM:  Temperature 98.7, heart rate 38, respirations 20, blood  pressure 140/64.  A pleasant white male in no acute distress.  Awake, alert and oriented  x3.  HEENT:  Notable for right eye enucleation. He normally has a prosthesis  but this is not currently in.  Otherwise HEENT is normal.  NECK:  No bruits or JVD.  LUNGS:  Respirations regular and unlabored, clear to  auscultation.  CARDIAC:  Irregular S1, S2, very distant heart sounds. He is bradycardic  and there are no murmurs.  ABDOMEN:  Obese, soft, nontender, nondistended.  Bowel sounds present  x4.  EXTREMITIES:  Warm, dry, pink.  No clubbing, cyanosis or edema.  Dorsalis pedis and posterior tibial pulses 1+ and equal bilaterally.  No  femoral bruits were noted.   Chest x-ray shows borderline cardiomegaly without edema or acute  findings.  EKG shows sinus bradycardia at a rate of 38 with Mobitz I  heart block.   Hemoglobin 12.6, hematocrit 37.0, WBC 6.5, platelets 190. Sodium 139,  potassium 4.6, chloride 105, CO2 28, BUN 18, creatinine 1.46, glucose  117, AST 24, ALT 22, total protein 6.9, albumin 3.6, CK-MB 2.2, troponin-  I less than 0.5, calcium 9.7.   ASSESSMENT/PLAN:  1. Unstable angina.  The patient awoke with tightness that persisted      for approximately 8 hours.  He is now pain free.  His first set of      cardiac markers were negative and ECG without acute ST or T      changes.  However, he is very bradycardic with Mobitz I. Will plan      to  admit and cycle cardiac markers.  Given his multiple risk      factors which include hypertension, hyperlipidemia, family history      and tobacco abuse, will plan on catheterization today to rule out      obstructive disease which may be contributing to his bradycardia.      If he has no CAD will plan on EP eval for possible/probable      permanent pacemaker.  2. Bradycardia.  Wenckebach persists.  He has now had resolution of      chest discomfort as above.  I question if bradycardia is secondary      to ischemia or if he exhibits a primary conduction disorder      resulting in bradycardia and chest pain.  EP eval for probable      pacemaker following cath as above.  He is hemodynamically stable.      His electrolytes are within normal  limits.  TSH and magnesium are      pending.  Of note, he is not on any AV nodal      blocking  agents. He reports no recent tick bites.  3. Hypertension. Stable. Hold ACE inhibitor and HCTZ for now.  4. Hypothyroidism.  Check TFTs.      [name], ANP      Juanda Bond. Burt Knack, MD  Electronically Signed    CB/MEDQ  D:  07/04/2007  T:  07/04/2007  Job:  [id number]

## 2010-10-24 NOTE — Assessment & Plan Note (Signed)
[facility]                            CARDIOLOGY OFFICE NOTE   NAME:TUTTLEChazz, Fok                        MRN:          [id number]  DATE:06/30/2008                            DOB:          October 07, 1921    PRIMARY CARE PHYSICIAN:  Chipper Herb, MD   REASON FOR PRESENTATION:  Evaluate the patient with coronary artery  disease and complete heart block.   HISTORY OF PRESENT ILLNESS:  The patient returns for 54-month followup of  the above.  He has done well since I last saw him.  He has had no new  cardiovascular complaints.  He gets around doing a little activity and  occasionally walks for exercise.  With this, he denies any chest  discomfort, neck, or arm discomfort.  He has had no palpitation,  presyncope, or syncope.  He has had no PND or orthopnea.  He has not had  his pacemaker checked in a while and is due for this.   PAST MEDICAL HISTORY:  1. Coronary artery disease (non-Q-wave myocardial infarction in      January 2009.  He had a chronically occluded right coronary artery.      He had an LAD with 90% stenosis at the take off of the first      diagonal.  He had a drug-eluting stent placed by Dr. Burt Knack).  2. Complete heart block (Zephyr dual chamber pacemaker per Dr. Caryl Comes).  3. Dyslipidemia.  4. Hypothyroidism.  5. Prostate cancer.  6. Macular degeneration.  7. Blindness in the right eye related to trauma in the [employer].   ALLERGIES:  None.   MEDICATIONS:  1. Zinc.  2. Fish oil.  3. Potassium.  4. Fosinopril HCT 10/12.5 daily.  5. Lipitor 20 mg daily.  6. Plavix 75 mg daily.  7. Metoprolol 50 mg daily.  8. Multivitamin.  9. Aspirin 325 mg daily.  10.Levothyroxine 150 mcg.   REVIEW OF SYSTEMS:  As stated in the HPI and otherwise negative for  other systems.   PHYSICAL EXAMINATION:  GENERAL:  The patient is in no distress.  VITAL SIGNS:  Blood pressure 144/72, heart rate 60 and regular, weight  238 pounds, and body mass index  30.  HEENT:  Eyelids remarkable for some damage to the right eyelid with  injected conjunctiva, left eye was unremarkable, the left pupils round  and reactive.  Oral mucosa unremarkable.  NECK:  No jugular venous distention at 45 degrees.  Carotid upstroke  brisk and symmetric.  No bruits.  No thyromegaly.  LYMPHATICS:  No adenopathy.  LUNGS:  Clear to auscultation bilaterally.  BACK:  No costovertebral angle tenderness.  CHEST:  Well-healed pacemaker pocket.  HEART:  PMI not displaced or sustained.  S1 and S2 within normal limits.  No S3, no S4.  No clicks, no rubs, no murmurs.  ABDOMEN:  Mildly obese, positive bowel sounds, normal in frequency and  pitch.  No bruits, no rebound, no guarding.  No midline pulsatile mass.  No organomegaly.  SKIN:  No rashes, no nodules.  EXTREMITIES:  Pulses 2+, mild bilateral lower  extremity edema.  NEUROLOGIC:  Grossly intact except for his cranial nerves.   EKG, atrioventricular pacing.   ASSESSMENT AND PLAN:  1. Coronary artery disease.  The patient is having no new symptoms.      No further cardiovascular testing is suggested.  He will continue      with the risk reduction.  2. Dyslipidemia.  I have asked him to get a lipid profile as I do not      see a recent one in Dr. Tawanna Sat chart.  He is going to see them      today and we will discuss this.  3. Status post pacemaker.  We will get him scheduled to have this      followed up in our [facility].  4. Hypertension.  Blood pressure is at the upper limits of normal.  He      is advised that we want it to be in the 140/90 range at the peak.      Certainly, weight loss would help to bring him to that goal.  5. Risk reduction.  I have discussed with him being more active such      as a stationary bicycle.  6. Followup.  I will see him back in 1 year or sooner if needed.     Minus Breeding, MD, [facility]  Electronically Signed    JH/MedQ  DD: 06/30/2008  DT: 07/01/2008  Job #: [id number]    cc:   Chipper Herb, M.D.

## 2010-10-24 NOTE — Discharge Summary (Signed)
NAMEZACHERY, [name]                 ACCOUNT NO.:  [id number]   MEDICAL RECORD NO.:  [id number]          PATIENT TYPE:  INP   LOCATION:  2920                         FACILITY:  [facility]   PHYSICIAN:  Sueanne Margarita, PA   DATE OF BIRTH:  [birth year]   DATE OF ADMISSION:  07/04/2007  DATE OF DISCHARGE:  07/09/2007                               DISCHARGE SUMMARY   ALLERGIES:  NO KNOWN DRUG ALLERGIES.   DICTATION AND EXAM TIME:  Greater than 35 minutes.   FINAL DIAGNOSES:  1. Admitted with unstable angina/bradycardia.  2. Non-ST segment elevation myocardial infarction this admission with      troponin I study zenith of 2.66.  3. Discharging day #5 status post percutaneous coronary      intervention/drug eluting stent to proximal left anterior      descending.      a.     Promus drug eluting stent.      b.     Enrolled in the ADAPT-DES study.  4. Complete heart block/Mobitz II heart block/profound first-degree AV      block.  5. Discharging day one status post implant of St. Jude ZEPHYR dual-      chamber pacemaker Dr. Virl Axe.   SECONDARY DIAGNOSES:  1. Hypertension.  2. Dyslipidemia.  3. Treated hypothyroidism.  4. History of prostate cancer.  5. Macular degeneration.  6. Status post right enucleation trauma, [employer] enlistment.   PROCEDURES:  1. July 04, 2007, proximal left anterior descending stenosis about      90%.  2. Chronic right coronary artery occlusion collateralized from the      left.  3. Successful percutaneous coronary intervention of the proximal left      anterior descending with a Promus drug-eluting stent, Dr. Sherren Mocha.  Patient was enrolled in the ADAPTA-DES research project.      Ejection fraction of 55% at cath.  4. July 07, 2007, ejection fraction 60%, unable to effectively      evaluate left ventricular regional wall motion.  Right ventricular      systolic function normal.  Unable to verify closely but mitral      inflow signal  difficult to interpret perhaps restrictive      physiology.  5. July 08, 2007, implant of a St. Jude ZEPHYR XL DR dual-chamber      pacemaker Dr. Virl Axe for complete heart block and secondary      AV block.  The patient has done well postprocedure.  Discharging      postprocedure day #1 after implant of pacemaker.      Electrocardiograms show that the PR interval has greatly improved      after implantation of pacemaker.   BRIEF HISTORY:  Mr. Vinas is an 77 year old male.  He has no prior  cardiac history.  He awoke at 3 o'clock in the morning of January  23,2009, with substernal chest pain.  It was refractory to home  treatment with Rolaids and baby aspirin.  Pain was 7/10.  In the  emergency room, he was found  to have bradycardia with elevated troponin  I studies a zenith of 2.66.  His admit rhythm was Mobitz I.  The patient  is on AV nodal blockers.  The patient will be set up for catheterization  to be followed possibly by pacemaker.   HOSPITAL COURSE:  The patient presented with unstable angina elevated  troponin I studies with NSTEMI.  He had catheterization which showed  chronic right coronary artery occlusion 100% and a high-grade stenosis  in the LAD at the takeoff of the first diagonal.  This was successfully  treated with Promus drug-eluting stent by Dr. Burt Knack, reducing a 90%  stenosis to zero.  The patient had done well after the catheterization.  He was seen in consultation for his medley of cardiac dysrhythmias which  included profound first-degree AV block, Mobitz II high-grade AV block  as well as intermittent heart block and also the patient had Wenckebach  Mobitz I.  He underwent implantation of the dual-chamber pacemaker  July 08, 2007.  PR interval was greatly improved as dictated above.  The patient is discharging postprocedure day #1.   DISCHARGE MEDICATIONS:  He goes home on the following medications.  1. Metoprolol succinate 50 mg daily.  This is  a new medication.  2. Enteric-coated aspirin 325 mg daily.  This is a new dose.  3. Plavix 75 mg daily.  A new medication.  4. Lipitor 20 mg daily at bedtime.  5. Levothyroxine 175 mcg daily.  6. Caltrate with vitamin D 600 mg daily.  7. Multivitamin daily.  8. Fish oil 1200 mg daily.  9. Fosinopril/hydrochlorothiazide 10/12.5 daily.  10.Potassium and zinc taken over-the-counter.   FOLLOW UP:  He has follow-up appointments with Dr. Percival Spanish of the  [facility] Wednesday, July 30, 2007, at  11:15.  He will see Dr. Caryl Comes, [facility] office  Tuesday, Oct 14, 2007, at 10:40 a.m. for reinterrogation of his  pacemaker.  He is asked to keep the incision dry for next seven days, to  sponge bathe until Tuesday, July 15, 2007.  Mobility of the left arm  has been discussed with the patient as has incision care.   LAB STUDIES THIS ADMISSION:  Complete blood count on July 08, 2007,  hemoglobin 11.9, hematocrit 34.4, white cells 7.4 and platelets 170.  Serum electrolytes on the day of discharge, July 09, 2007, sodium  136, potassium 4, chloride 103, carbonate 23, BUN 23, creatinine 1.51,  glucose 105.  Alkaline phosphatase this admission 41, SGOT is 28, SGPT  is 20.  Once again, I studies are 0.02, then 1.65, then 2.66.  TSH this  admission is  1.245, free T4 is 1.03, this is within normal limits.      Sueanne Margarita, PA     GM/MEDQ  D:  07/09/2007  T:  07/09/2007  Job:  [id number]   cc:   Minus Breeding, MD, [facility]  Deboraha Sprang, MD, [facility]  Chipper Herb, M.D.

## 2010-10-24 NOTE — Assessment & Plan Note (Signed)
[facility]                            CARDIOLOGY OFFICE NOTE   NAME:[name], [name]                        MRN:          [id number]  DATE:10/29/2007                            DOB:          07-03-1921    PRIMARY CARE PHYSICIAN:  Chipper Herb, M.D.   REASON FOR PRESENTATION:  Evaluate the patient with coronary disease and  complete heart block.   HISTORY OF PRESENT ILLNESS:  The patient presents for followup of the  above.  He is 77 years old.  He has done well since I last saw him.  He  gets around in his garden.  With this level of activity, he has had none  of the chest tightness that he had at the time of his non-Q-wave heart  attack in January.  He has had no chest pressure, neck or arm  discomfort.  He has had no palpitation, presyncope or syncope.  He  denies any PND or orthopnea.  He is limited by blindness.  He did see  Dr. Caryl Comes recently for followup of his pacemaker.   PAST MEDICAL HISTORY:  1. Coronary artery disease (non-Q-wave myocardial infarction January      2009.  He had a chronically occluded right coronary artery.  The      LAD had 90% stenosis at the takeoff of the first diagonal.  He had      a drug-eluting stent placed by Dr. Burt Knack.)  2. Complete heart block (Zephyr dual-chamber pacemaker per Dr. Caryl Comes).  3. Dyslipidemia.  4. Hypothyroidism.  5. Prostate cancer.  6. Macular generation.  7. 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   ALLERGIES:  NONE.   MEDICATIONS:  1. Zinc.  2. Fish oil.  3. Potassium.  4. Fosinopril/HCT 10/12.5 daily.  5. Lipitor 20 mg daily.  6. Levothyroxine 175 mcg daily.  7. Plavix 75 mg daily.  8. Metoprolol 15 mg daily.  9. Multivitamin.  10.Aspirin 325 mg daily.   REVIEW OF SYSTEMS:  As stated in the HPI and otherwise negative for all  other systems.   PHYSICAL EXAMINATION:  GENERAL:  The patient is in no distress.  VITAL SIGNS:  Blood pressure 140/68, heart rate  60, weight 238 pounds,  body mass index 30.  NECK:  No jugular venous distention at 45 degrees.  Carotid upstroke  brisk and symmetrical.  No bruits, no thyromegaly.  LUNGS:  Clear to auscultation bilaterally.  HEART:  PMI not displaced or sustained, S1-S2 within normal limits.  No  0000000, no clicks, rubs, murmurs.  CHEST:  Well-healed pacemaker pocket.  ABDOMEN:  Flat, positive bowel sounds normal in frequency and pitch, no  bruits, rebound or guarding, no midline pulsatile mass, no organomegaly.  SKIN:  No rash.  No nodules.  EXTREMITIES:  2+ pulses, mild bilateral lower extremity edema.  NEURO:  Except for his cranial nerves where I cannot examine his eyes,  otherwise he is intact.   DIAGNOSTICS:  EKG sinus rhythm, rate 64, demand ventricular pacemaker.   ASSESSMENT/PLAN:  1. Coronary artery disease, having no new symptoms.  He knows not to      let anybody discontinue the Plavix at least until January of next      year.  He will continue other medicines as listed and continue with      risk reduction.  2. Complete heart block.  He is status post pacemaker placement and      will see Dr. Caryl Comes in 9 months.  There was some far field sensing      and they may need to reprogram if this continues.  3. Dyslipidemia per Dr. Laurance Flatten.  The goal being LDL less than 100 and      HDL greater than 40.   FOLLOW UP:  I will see him back in January or sooner if needed.     Minus Breeding, MD, [facility]  Electronically Signed    JH/MedQ  DD: 10/29/2007  DT: 10/29/2007  Job #: [id number]

## 2010-10-24 NOTE — Op Note (Signed)
[name], [name]                 ACCOUNT NO.:  [id number]   MEDICAL RECORD NO.:  [id number]          PATIENT TYPE:  INP   LOCATION:  2920                         FACILITY:  [facility]   PHYSICIAN:  Deboraha Sprang, MD, FACCDATE OF BIRTH:  [birth year]   DATE OF PROCEDURE:  07/08/2007  DATE OF DISCHARGE:                               OPERATIVE REPORT   PREOPERATIVE DIAGNOSIS:  Intermittent complete heart block, second  degree AV block and significant first-degree AV block.   POSTOPERATIVE DIAGNOSIS:  Intermittent complete heart block, second  degree AV block and significant first-degree AV block.   PROCEDURE:  Dual-chamber pacemaker implantation.   Following obtaining informed consent, the patient was brought to the  electrophysiology laboratory and placed on the fluoroscopic table in  supine position.  After routine prep and drape of the left upper chest,  lidocaine was infiltrated in prepectoral subclavicular region.  Incision  was made and carried down to layer of the prepectoral fascia with  electrocautery and sharp dissection.  A pocket was formed similarly.  Hemostasis was obtained.   Thereafter attention was turned to gaining access to extrathoracic left  subclavian vein which was accomplished without difficulty and without  the aspiration of air or puncture of the artery.  Two separate  venipunctures were accomplished.  Guidewires were placed and retained  and 0-0 silk suture was placed in a figure-of-eight fashion and allowed  to hang loosely.   Sequentially 7-French sheaths were placed through which were passed a  St. Jude 1688T 58 cm fixation ventricular lead serial number [id number]  and a St. Jude 1688TC 52 cm fixation atrial lead serial number [id number].  Under fluoroscopic guidance these leads were manipulated to the right  ventricular septum and the right atrial appendage respectively where the  bipolar R wave was 13 with a pace impedance of 632 ohms, a threshold 0.7  volts at 0.5 milliseconds.  Current threshold 0.9 MA.  There is no  diaphragmatic pacing at 10 volts and current of injury was brisk.   The bipolar P-wave was 3.3 with a pace impedance of 546 and a threshold  0.7 volts at 0.5 milliseconds.  Current at threshold was 1.2 MA.  Again  there is no diaphragmatic pacing at 10 volts and the current of injury  was brisk.  With these acceptable parameters recorded, the leads were  secured to the prepectoral fascia.  I ended up having to retract the  ventricular lead about an inch or so after it was sewn in and to do that  I used a scalpel to free up the sutures.  I must have inadvertently cut  the insulation of the lead.  There was then blood in the insulation  itself.  The lead was withdrawn a little bit more.  The lead was  repaired with medical adhesive.  Parameters were reassessed and they  were stable and the leads were then attached to a St. Jude Zephyr 5826  pulse generator, serial number [id number]. Ventricular pacing and then P  synchronous pacing were identified.   The pocket was copiously irrigated with antibiotic containing  saline  solution.  Hemostasis was assured and the leads and pulse generator were  replaced in the pocket, secured to the prepectoral fascia.  The wound  was closed in three  layers in normal fashion.  The wound was washed, dried and a benzoin  Steri-Strip dressing was applied.  Needle counts, sponge counts and  instrument counts were correct at the end of the procedure according to  staff.  The patient tolerated the procedure without apparent  complication.      Deboraha Sprang, MD, [facility]  Electronically Signed     SCK/MEDQ  D:  07/08/2007  T:  07/08/2007  Job:  [id number]   cc:   Carlos Bond. Burt Knack, MD  [name], M.D.  [name] cl

## 2010-10-27 NOTE — Assessment & Plan Note (Signed)
[facility] OFFICE NOTE   NAME:TUTTLEAhmi, Nunnery                        MRN:          [id number]  DATE:07/18/2006                            DOB:          02-13-1922    [name] is a very nice 77 year old gentleman who is brought by his  daughter today at Dr. Tawanna Sat recommendation for colonoscopy.  The  reason for colonoscopy is heme-positive stool, which was found on a home  test  and administered by the [facility] in [place] on May 16, 2006.  The patient denies any GI symptoms.  His bowel habits have  been regular.  There is no family history of colon cancer.  He has on  occasion taken stool softeners.  He denies abdominal pain or weight  loss.  [name] is legally blind due to macular degeneration and  cataracts; therefore, he really does not know if he is passing blood,  but he does admit to having hemorrhoids in the past. He has a hx of  prostate carcinome and radiation to the prostate.   MEDICATIONS:  1. Zinc.  2. Fish oil.  3. Potassium.  4. fosinopril/HCTZ 10/12.5 one p.o. daily.  5. Lipitor 20 mg p.o. daily.  6..  Levothyroxine 175 mcg daily.  1. Aspirin 81 mg p.o. daily.   PAST MEDICAL HISTORY:  Significant for high blood pressure, high  cholesterol, thyroid problems.  He had eye surgery and right hand  surgery.  He had cancer of the prostate.   FAMILY HISTORY:  Positive for heart disease.   SOCIAL HISTORY:  Married with 3 children, lives with wife.  He is  retired from [employer].  He does not smoke.  He does not drink  alcohol.   REVIEW OF SYSTEMS:  He has been overweight.  He is legally blind.   PHYSICAL EXAMINATION:  Blood pressure 130/70, pulse 78, weight 242  pounds.  The patient was alert and orientated, but moved very slowly.  He was  able to position himself on an examining table.  LUNGS:  Clear to auscultation.  COR:  Quiet, S1 and S2.  ABDOMEN:   Protuberant, but soft with normoactive bowel sounds.  Liver  edge at the costal margin.  No scars.  Lower abdomen was normal, no  palpable mass.  RECTAL:  With normal rectal tone, no visible hemorrhoids. Stool was  strongly Hemoccult positive.  EXTREMITIES:  Showed 1+ edema peripherally.   IMPRESSION:  An 77 year old gentleman who has never had a colonoscopy,  now strongly Hemoccult positive from a home test as well as on my rectal  exam today.  Possibilities here include symptomatic hemorrhoids,  radiation proctitis,colon polyp or colon cancer, possibly upper  gastrointestinal source, although he has no upper gastrointestinal  symptoms.   PLAN:  CBC today, PT, PTT, schedule colonoscopy with routine colonoscopy  prep.  He will continue on his aspirin.     Lowella Bandy. Olevia Perches, MD  Electronically Signed    DMB/MedQ  DD: 07/18/2006  DT: 07/18/2006  Job #: [id number]   cc:   Chipper Herb,  M.D.  Dr. Rud[facility] in [place]

## 2010-11-09 ENCOUNTER — Encounter: Payer: Self-pay | Admitting: Internal Medicine

## 2010-11-09 DIAGNOSIS — I442 Atrioventricular block, complete: Secondary | ICD-10-CM

## 2011-02-08 ENCOUNTER — Encounter: Payer: Self-pay | Admitting: Internal Medicine

## 2011-02-08 DIAGNOSIS — I442 Atrioventricular block, complete: Secondary | ICD-10-CM

## 2011-03-02 LAB — COMPREHENSIVE METABOLIC PANEL
Albumin: 3.1 — ABNORMAL LOW
Alkaline Phosphatase: 50
BUN: 18
BUN: 20
Calcium: 9
Calcium: 9.7
Creatinine, Ser: 1.46
Creatinine, Ser: 1.47
Glucose, Bld: 117 — ABNORMAL HIGH
Glucose, Bld: 119 — ABNORMAL HIGH
Total Protein: 6.1
Total Protein: 6.9

## 2011-03-02 LAB — BASIC METABOLIC PANEL
BUN: 19
BUN: 23
CO2: 27
Chloride: 103
Chloride: 106
Creatinine, Ser: 1.37
Creatinine, Ser: 1.59 — ABNORMAL HIGH
GFR calc Af Amer: 54 — ABNORMAL LOW
GFR calc non Af Amer: 41 — ABNORMAL LOW
GFR calc non Af Amer: 42 — ABNORMAL LOW
GFR calc non Af Amer: 50 — ABNORMAL LOW
Glucose, Bld: 117 — ABNORMAL HIGH
Glucose, Bld: 120 — ABNORMAL HIGH
Potassium: 4
Potassium: 4.1
Potassium: 4.2
Sodium: 136
Sodium: 139

## 2011-03-02 LAB — CBC
HCT: 34.7 — ABNORMAL LOW
HCT: 35.2 — ABNORMAL LOW
HCT: 35.2 — ABNORMAL LOW
HCT: 37 — ABNORMAL LOW
Hemoglobin: 11.9 — ABNORMAL LOW
Hemoglobin: 12 — ABNORMAL LOW
Hemoglobin: 12.6 — ABNORMAL LOW
MCHC: 34.1
MCHC: 34.5
MCV: 89.1
MCV: 89.3
MCV: 89.5
Platelets: 169
Platelets: 170
Platelets: 175
RDW: 13.6
RDW: 13.6
RDW: 13.7
RDW: 14
WBC: 7.4

## 2011-03-02 LAB — APTT: aPTT: 67 — ABNORMAL HIGH

## 2011-03-02 LAB — MAGNESIUM: Magnesium: 2

## 2011-03-02 LAB — CARDIAC PANEL(CRET KIN+CKTOT+MB+TROPI)
CK, MB: 13.2 — ABNORMAL HIGH
Relative Index: 8.2 — ABNORMAL HIGH
Relative Index: INVALID
Total CK: 83
Troponin I: 1.65

## 2011-03-02 LAB — DIFFERENTIAL
Basophils Relative: 1
Lymphs Abs: 1.3
Monocytes Relative: 10
Neutro Abs: 4.3
Neutrophils Relative %: 66

## 2011-03-02 LAB — POCT CARDIAC MARKERS
CKMB, poc: 2.2
Myoglobin, poc: 130
Myoglobin, poc: 135

## 2011-03-02 LAB — LIPID PANEL
Cholesterol: 150
HDL: 30 — ABNORMAL LOW
LDL Cholesterol: UNDETERMINED
Total CHOL/HDL Ratio: 5
Triglycerides: 427 — ABNORMAL HIGH
VLDL: UNDETERMINED

## 2011-03-02 LAB — PROTIME-INR: Prothrombin Time: 16.7 — ABNORMAL HIGH

## 2011-03-02 LAB — TSH: TSH: 1.245

## 2011-05-10 ENCOUNTER — Encounter: Payer: Self-pay | Admitting: Internal Medicine

## 2011-05-10 DIAGNOSIS — I442 Atrioventricular block, complete: Secondary | ICD-10-CM

## 2011-07-19 DIAGNOSIS — Z9001 Acquired absence of eye: Secondary | ICD-10-CM | POA: Diagnosis not present

## 2011-07-19 DIAGNOSIS — H05339 Deformity of unspecified orbit due to trauma or surgery: Secondary | ICD-10-CM | POA: Diagnosis not present

## 2011-08-09 ENCOUNTER — Encounter: Payer: Self-pay | Admitting: Internal Medicine

## 2011-08-09 DIAGNOSIS — I442 Atrioventricular block, complete: Secondary | ICD-10-CM | POA: Diagnosis not present

## 2011-08-09 DIAGNOSIS — I495 Sick sinus syndrome: Secondary | ICD-10-CM | POA: Diagnosis not present

## 2011-08-14 DIAGNOSIS — N189 Chronic kidney disease, unspecified: Secondary | ICD-10-CM | POA: Diagnosis not present

## 2011-08-14 DIAGNOSIS — E119 Type 2 diabetes mellitus without complications: Secondary | ICD-10-CM | POA: Diagnosis not present

## 2011-08-14 DIAGNOSIS — E785 Hyperlipidemia, unspecified: Secondary | ICD-10-CM | POA: Diagnosis not present

## 2011-08-14 DIAGNOSIS — I2510 Atherosclerotic heart disease of native coronary artery without angina pectoris: Secondary | ICD-10-CM | POA: Diagnosis not present

## 2011-09-18 ENCOUNTER — Encounter: Payer: Self-pay | Admitting: Internal Medicine

## 2011-09-18 ENCOUNTER — Ambulatory Visit (INDEPENDENT_AMBULATORY_CARE_PROVIDER_SITE_OTHER): Payer: Medicare Other | Admitting: Internal Medicine

## 2011-09-18 VITALS — BP 140/64 | HR 62 | Ht 73.0 in | Wt 237.0 lb

## 2011-09-18 DIAGNOSIS — R0989 Other specified symptoms and signs involving the circulatory and respiratory systems: Secondary | ICD-10-CM

## 2011-09-18 DIAGNOSIS — G589 Mononeuropathy, unspecified: Secondary | ICD-10-CM

## 2011-09-18 DIAGNOSIS — I4891 Unspecified atrial fibrillation: Secondary | ICD-10-CM

## 2011-09-18 DIAGNOSIS — I442 Atrioventricular block, complete: Secondary | ICD-10-CM | POA: Diagnosis not present

## 2011-09-18 DIAGNOSIS — G629 Polyneuropathy, unspecified: Secondary | ICD-10-CM

## 2011-09-18 DIAGNOSIS — Z950 Presence of cardiac pacemaker: Secondary | ICD-10-CM | POA: Diagnosis not present

## 2011-09-18 DIAGNOSIS — I2510 Atherosclerotic heart disease of native coronary artery without angina pectoris: Secondary | ICD-10-CM

## 2011-09-18 LAB — PACEMAKER DEVICE OBSERVATION
AL THRESHOLD: 0.75 V
ATRIAL PACING PM: 40
BAMS-0003: 70 {beats}/min
DEVICE MODEL PM: 2010227
RV LEAD IMPEDENCE PM: 418 Ohm
RV LEAD THRESHOLD: 0.75 V

## 2011-09-18 NOTE — Patient Instructions (Addendum)
Your physician has requested that you have a lexiscan myoview. For further information please visit [email or web address]. Please follow instruction sheet, as given.  You are being referred to [facility]- Dr. Mina Marble: dx neuropathy/ balance issues  Your physician wants you to follow-up in: 1 year with Dr. Caryl Comes. You will receive a reminder letter in the mail two months in advance. If you don't receive a letter, please call our office to schedule the follow-up appointment.  Your physician recommends that you continue on your current medications as directed. Please refer to the Current Medication list given to you today.

## 2011-09-18 NOTE — Assessment & Plan Note (Signed)
The patient's device was interrogated.  The information was reviewed. No changes were made in the programming.   ona

## 2011-09-18 NOTE — Assessment & Plan Note (Signed)
No significant intercurrent atrial fibrillation

## 2011-09-18 NOTE — Progress Notes (Signed)
HPI  [name] is a 78 y.o. male  seen in  followup for a pacemaker implanted for CHB jan 2009.  He aslo has CAD with a chronically occluded right coronary artery. He had an LAD with 90% stenosis at the take off of the first diagonal.  He had a drug-eluting stent placed by Dr. Burt Knack 2009   Myoview scan done 2011 was negative for ischemia;     His daughter has noted a significant change in his exercise tolerance with shortness of breath even with ambulating in the house.  The issues of balance and tingling in his feet. He denies chest discomfort; has been no orthopnea or peripheral edema.  There has also been significant fatigue and sitting around. Concurrent with this has been a rapid deterioration in the status of his wife who has chronic and debilitating rheumatoid arthritis; she is now bedbound. Parenthetically, the patient denies severe depression, noting that now that his garden he started feeling some better. He is however agreeable with the idea  that depression may be part of what's going on  Past Medical History  Diagnosis Date  . Cancer     prostate ca with radiation in 2005  . Hypertension   . Cellulitis of face feb 2011    prostetic rt eye, cellulitis of rt side of face  . CAD (coronary artery disease)     (non Q wave MI in Jan 2009. He had chronically ocluded right coronary artery. He had a LAD w/90% stenosis at the take off of the first diagona. He had a drug-eluding stent palced by Dr Burt Knack)  . Complete heart block     Zephyr dual chamber pacemaker per Dr Caryl Comes  . Dyslipidemia   . Hypothyroid   . Macular degeneration   . Blindness of right eye     Related to trauma in [employer]    No past surgical history on file.  Current Outpatient Prescriptions  Medication Sig Dispense Refill  . aspirin 81 MG tablet Take 81 mg by mouth daily.        Marland Kitchen atorvastatin (LIPITOR) 20 MG tablet Take 20 mg by mouth at bedtime.        . Chelated Zinc 50 MG TABS Take by mouth every  other day.        . clopidogrel (PLAVIX) 75 MG tablet Take 75 mg by mouth daily.        Marland Kitchen levothyroxine (SYNTHROID, LEVOTHROID) 175 MCG tablet Take 175 mcg by mouth daily.        . metoprolol (TOPROL-XL) 50 MG 24 hr tablet Take 50 mg by mouth daily.        . Multiple Vitamins-Minerals (MULTIVITAMIN,TX-MINERALS) tablet Take 1 tablet by mouth daily.        . nitroGLYCERIN (NITROSTAT) 0.4 MG SL tablet Place 0.4 mg under the tongue as needed.        Marland Kitchen omega-3 acid ethyl esters (LOVAZA) 1 G capsule Take 2 g by mouth 2 (two) times daily.          No Known Allergies  Review of Systems negative except from HPI and PMH  Physical Exam There were no vitals taken for this visit. Well developed and well nourished in no acute distress HENT normal E scleral and icterus clear Neck Supple JVP 8-10 ; carotids brisk and full Clear to ausculation Regular rate and rhythm, no murmurs gallops or rub Soft with active bowel sounds No clubbing cyanosis none Edema Alert and oriented, walks slowly  with a wide-based gait Skin Warm and Dry   Assessment and  Plan

## 2011-09-18 NOTE — Assessment & Plan Note (Signed)
According to his family there has been a major change in his exercise tolerance. There is a great deal of stress related to his wife not sure how all these things playing into his poor functional status.  We'll undertake a Myoview scan. If it is high risk we will proceed. I've also asked his family to consider the role of depression and consideration for referral for further help with that

## 2011-09-18 NOTE — Assessment & Plan Note (Signed)
As above.

## 2011-09-25 ENCOUNTER — Ambulatory Visit (HOSPITAL_COMMUNITY): Payer: Medicare Other | Attending: Internal Medicine | Admitting: Radiology

## 2011-09-25 VITALS — BP 148/72 | Ht 74.0 in | Wt 237.0 lb

## 2011-09-25 DIAGNOSIS — E663 Overweight: Secondary | ICD-10-CM | POA: Diagnosis not present

## 2011-09-25 DIAGNOSIS — I447 Left bundle-branch block, unspecified: Secondary | ICD-10-CM

## 2011-09-25 DIAGNOSIS — R5383 Other fatigue: Secondary | ICD-10-CM | POA: Insufficient documentation

## 2011-09-25 DIAGNOSIS — I2510 Atherosclerotic heart disease of native coronary artery without angina pectoris: Secondary | ICD-10-CM

## 2011-09-25 DIAGNOSIS — E785 Hyperlipidemia, unspecified: Secondary | ICD-10-CM | POA: Insufficient documentation

## 2011-09-25 DIAGNOSIS — I442 Atrioventricular block, complete: Secondary | ICD-10-CM

## 2011-09-25 DIAGNOSIS — R5381 Other malaise: Secondary | ICD-10-CM | POA: Diagnosis not present

## 2011-09-25 DIAGNOSIS — I252 Old myocardial infarction: Secondary | ICD-10-CM | POA: Diagnosis not present

## 2011-09-25 DIAGNOSIS — I10 Essential (primary) hypertension: Secondary | ICD-10-CM | POA: Insufficient documentation

## 2011-09-25 DIAGNOSIS — R0609 Other forms of dyspnea: Secondary | ICD-10-CM | POA: Diagnosis not present

## 2011-09-25 DIAGNOSIS — Z87891 Personal history of nicotine dependence: Secondary | ICD-10-CM | POA: Diagnosis not present

## 2011-09-25 DIAGNOSIS — R0602 Shortness of breath: Secondary | ICD-10-CM | POA: Diagnosis not present

## 2011-09-25 DIAGNOSIS — I4891 Unspecified atrial fibrillation: Secondary | ICD-10-CM

## 2011-09-25 DIAGNOSIS — R0989 Other specified symptoms and signs involving the circulatory and respiratory systems: Secondary | ICD-10-CM | POA: Insufficient documentation

## 2011-09-25 DIAGNOSIS — Z8249 Family history of ischemic heart disease and other diseases of the circulatory system: Secondary | ICD-10-CM | POA: Diagnosis not present

## 2011-09-25 MED ORDER — TECHNETIUM TC 99M TETROFOSMIN IV KIT
33.0000 | PACK | Freq: Once | INTRAVENOUS | Status: AC | PRN
  Administered 2011-09-25: 33 via INTRAVENOUS

## 2011-09-25 MED ORDER — ADENOSINE (DIAGNOSTIC) 3 MG/ML IV SOLN
0.5600 mg/kg | Freq: Once | INTRAVENOUS | Status: AC
  Administered 2011-09-25: 60.3 mg via INTRAVENOUS

## 2011-09-25 MED ORDER — TECHNETIUM TC 99M TETROFOSMIN IV KIT
10.9000 | PACK | Freq: Once | INTRAVENOUS | Status: AC | PRN
  Administered 2011-09-25: 10.9 via INTRAVENOUS

## 2011-09-25 NOTE — Progress Notes (Signed)
[place] [address] [phone]  Cardiology Nuclear Med Study  [name] is a 78 y.o. male     MRN : [id number]     DOB: 1922/03/25  Procedure Date: 09/25/2011  Nuclear Med Background Indication for Stress Test:  Evaluation for Ischemia and Stent Patency History:  '09 MI>Stent-LAD, RCA occluded with collaterals, EF=55%; '09 PTVP; '11 [id number], EF=63%; 2/12 Echo:EF=55%; h/o afib. Cardiac Risk Factors: Family History - CAD, History of Smoking, Hypertension, Lipids and Overweight  Symptoms:  DOE, Fatigue and Fatigue with Exertion   Nuclear Pre-Procedure Caffeine/Decaff Intake:  None NPO After: 7:00pm   Lungs:  clear O2 Sat: 93% on room air. IV 0.9% NS with Angio Cath:  20g  IV Site: R Antecubital  IV Started by:  Eliezer Lofts, EMT-P  Chest Size (in):  44 Cup Size: n/a  Height: 6\' 2"  (1.88 m)  Weight:  237 lb (107.502 kg)  BMI:  Body mass index is 30.43 kg/(m^2). Tech Comments:  Meds were taken as directed at 9am, per patient.    Nuclear Med Study 1 or 2 day study: 1 day  Stress Test Type:  Adenosine  Reading MD: Kirk Ruths, MD  Order Authorizing Provider:  Jolyn Nap, MD  Resting Radionuclide: Technetium 96m Tetrofosmin  Resting Radionuclide Dose: 10.9 mCi   Stress Radionuclide:  Technetium 93m Tetrofosmin  Stress Radionuclide Dose: 32.9 mCi           Stress Protocol Rest HR: 60 Stress HR: 82  Rest BP: 148/72 Stress BP: 144/60  Exercise Time (min): n/a METS: n/a   Predicted Max HR: 133 bpm % Max HR: 61.65 bpm Rate Pressure Product: 11808   Dose of Adenosine (mg):  60.0 Dose of Lexiscan: n/a mg  Dose of Atropine (mg): n/a Dose of Dobutamine: n/a mcg/kg/min (at max HR)  Stress Test Technologist: Letta Moynahan, CMA-N  Nuclear Technologist:  Charlton Amor, CNMT     Rest Procedure:  Myocardial perfusion imaging was performed at rest 45 minutes following the intravenous administration of  Technetium 40m Tetrofosmin.  Rest ECG: AV paced with LBBB.  Stress Procedure:  The patient received IV adenosine at 140 mcg/kg/min for 4 minutes.  There were no significant changes with infusion.  He did c/o chest tightness with infusion.  Technetium 89m Tetrofosmin was injected at the 2 minute mark and quantitative spect images were obtained after a 45 minute delay.  Stress ECG: Uninterpretable due to baseline ventricular pacing  QPS Raw Data Images:  Acquisition technically good; normal left ventricular size. Stress Images:  There is decreased uptake in the apex. Rest Images:  There is decreased uptake in the apex. Subtraction (SDS):  No evidence of ischemia. Transient Ischemic Dilatation (Normal <1.22):  0.97 Lung/Heart Ratio (Normal <0.45):  0.34  Quantitative Gated Spect Images QGS EDV:  97 ml QGS ESV:  38 ml  Impression Exercise Capacity:  Adenosine study with no exercise. BP Response:  Normal blood pressure response. Clinical Symptoms:  There is chest tightness. ECG Impression:  EKG uninterpretable due to pacing. Comparison with Prior Nuclear Study: No images to compare  Overall Impression:  Normal stress nuclear study with a small, mild fixed apical defect consistent with thinning; no ischemia.  LV Ejection Fraction: 61%.  LV Wall Motion:  NL LV Function; NL Wall Motion   Kirk Ruths

## 2011-10-16 DIAGNOSIS — L609 Nail disorder, unspecified: Secondary | ICD-10-CM | POA: Diagnosis not present

## 2011-10-16 DIAGNOSIS — L851 Acquired keratosis [keratoderma] palmaris et plantaris: Secondary | ICD-10-CM | POA: Diagnosis not present

## 2011-10-16 DIAGNOSIS — I70209 Unspecified atherosclerosis of native arteries of extremities, unspecified extremity: Secondary | ICD-10-CM | POA: Diagnosis not present

## 2011-10-23 ENCOUNTER — Telehealth: Payer: Self-pay | Admitting: Internal Medicine

## 2011-10-23 NOTE — Telephone Encounter (Signed)
jesica with Dr Duke Salvia @ [facility] calling re status of  surgical clearence

## 2011-10-24 NOTE — Telephone Encounter (Signed)
I left a message on Dr. Glennis Brink surgery scheduler's voice mail that I have not received any paperwork or notice that the patient needs surgical clearance. I have left a message asking if there is paperwork they can fax to [place], that they do so.

## 2011-10-24 NOTE — Telephone Encounter (Signed)
I spoke with Marni Griffon at Dr. Glennis Brink office. She faxed the surgical clearance paperwork on the patient. I reviewed this. In looking at the paperwork, I do remember seeing this. The patient was seen in our office on 09/18/11 and at that time, it was my understanding from Dr. Caryl Comes, that the patient did not want to pursue eye surgery. I have called Keeta back and left her a message in this regards. I have asked that she call me back and let me know has she spoken with the patient recently and he re-considered. If so, I will review again with Dr. Caryl Comes.

## 2011-11-06 NOTE — Telephone Encounter (Signed)
Faxed paperwork back to Dr. Glennis Brink office on 11/01/11.

## 2011-11-08 ENCOUNTER — Encounter: Payer: Self-pay | Admitting: Internal Medicine

## 2011-11-08 DIAGNOSIS — I495 Sick sinus syndrome: Secondary | ICD-10-CM | POA: Diagnosis not present

## 2011-11-08 DIAGNOSIS — I442 Atrioventricular block, complete: Secondary | ICD-10-CM | POA: Diagnosis not present

## 2011-11-19 ENCOUNTER — Ambulatory Visit: Payer: Medicare Other | Admitting: Neurology

## 2011-12-07 DIAGNOSIS — Z125 Encounter for screening for malignant neoplasm of prostate: Secondary | ICD-10-CM | POA: Diagnosis not present

## 2011-12-07 DIAGNOSIS — I10 Essential (primary) hypertension: Secondary | ICD-10-CM | POA: Diagnosis not present

## 2011-12-07 DIAGNOSIS — R5381 Other malaise: Secondary | ICD-10-CM | POA: Diagnosis not present

## 2011-12-07 DIAGNOSIS — R5383 Other fatigue: Secondary | ICD-10-CM | POA: Diagnosis not present

## 2011-12-07 DIAGNOSIS — IMO0001 Reserved for inherently not codable concepts without codable children: Secondary | ICD-10-CM | POA: Diagnosis not present

## 2011-12-07 DIAGNOSIS — E785 Hyperlipidemia, unspecified: Secondary | ICD-10-CM | POA: Diagnosis not present

## 2012-02-07 DIAGNOSIS — I442 Atrioventricular block, complete: Secondary | ICD-10-CM | POA: Diagnosis not present

## 2012-02-07 DIAGNOSIS — I495 Sick sinus syndrome: Secondary | ICD-10-CM | POA: Diagnosis not present

## 2012-03-07 DIAGNOSIS — N189 Chronic kidney disease, unspecified: Secondary | ICD-10-CM | POA: Diagnosis not present

## 2012-03-07 DIAGNOSIS — E785 Hyperlipidemia, unspecified: Secondary | ICD-10-CM | POA: Diagnosis not present

## 2012-03-07 DIAGNOSIS — E039 Hypothyroidism, unspecified: Secondary | ICD-10-CM | POA: Diagnosis not present

## 2012-03-07 DIAGNOSIS — I10 Essential (primary) hypertension: Secondary | ICD-10-CM | POA: Diagnosis not present

## 2012-03-07 DIAGNOSIS — IMO0001 Reserved for inherently not codable concepts without codable children: Secondary | ICD-10-CM | POA: Diagnosis not present

## 2012-05-09 DIAGNOSIS — I442 Atrioventricular block, complete: Secondary | ICD-10-CM | POA: Diagnosis not present

## 2012-05-09 DIAGNOSIS — I495 Sick sinus syndrome: Secondary | ICD-10-CM | POA: Diagnosis not present

## 2012-05-20 DIAGNOSIS — Z8546 Personal history of malignant neoplasm of prostate: Secondary | ICD-10-CM | POA: Diagnosis not present

## 2012-05-20 DIAGNOSIS — R3915 Urgency of urination: Secondary | ICD-10-CM | POA: Diagnosis not present

## 2012-05-20 DIAGNOSIS — R351 Nocturia: Secondary | ICD-10-CM | POA: Diagnosis not present

## 2012-06-23 DIAGNOSIS — I2510 Atherosclerotic heart disease of native coronary artery without angina pectoris: Secondary | ICD-10-CM | POA: Diagnosis not present

## 2012-06-23 DIAGNOSIS — E119 Type 2 diabetes mellitus without complications: Secondary | ICD-10-CM | POA: Diagnosis not present

## 2012-06-23 DIAGNOSIS — E039 Hypothyroidism, unspecified: Secondary | ICD-10-CM | POA: Diagnosis not present

## 2012-06-23 DIAGNOSIS — E785 Hyperlipidemia, unspecified: Secondary | ICD-10-CM | POA: Diagnosis not present

## 2012-06-23 DIAGNOSIS — IMO0001 Reserved for inherently not codable concepts without codable children: Secondary | ICD-10-CM | POA: Diagnosis not present

## 2012-08-08 ENCOUNTER — Encounter: Payer: Self-pay | Admitting: Internal Medicine

## 2012-08-08 DIAGNOSIS — I495 Sick sinus syndrome: Secondary | ICD-10-CM | POA: Diagnosis not present

## 2012-08-08 DIAGNOSIS — I442 Atrioventricular block, complete: Secondary | ICD-10-CM | POA: Diagnosis not present

## 2012-08-27 ENCOUNTER — Other Ambulatory Visit: Payer: Self-pay

## 2012-08-28 ENCOUNTER — Encounter (HOSPITAL_COMMUNITY): Payer: Self-pay | Admitting: Emergency Medicine

## 2012-08-28 ENCOUNTER — Emergency Department (HOSPITAL_COMMUNITY): Payer: Medicare Other

## 2012-08-28 ENCOUNTER — Emergency Department (HOSPITAL_COMMUNITY)
Admission: EM | Admit: 2012-08-28 | Discharge: 2012-08-28 | Disposition: A | Discharge status: Home / Self Care | Payer: Medicare Other | Attending: Emergency Medicine | Admitting: Emergency Medicine

## 2012-08-28 DIAGNOSIS — E119 Type 2 diabetes mellitus without complications: Secondary | ICD-10-CM | POA: Diagnosis not present

## 2012-08-28 DIAGNOSIS — E785 Hyperlipidemia, unspecified: Secondary | ICD-10-CM | POA: Insufficient documentation

## 2012-08-28 DIAGNOSIS — I2510 Atherosclerotic heart disease of native coronary artery without angina pectoris: Secondary | ICD-10-CM | POA: Diagnosis not present

## 2012-08-28 DIAGNOSIS — H548 Legal blindness, as defined in USA: Secondary | ICD-10-CM | POA: Insufficient documentation

## 2012-08-28 DIAGNOSIS — R209 Unspecified disturbances of skin sensation: Secondary | ICD-10-CM | POA: Diagnosis not present

## 2012-08-28 DIAGNOSIS — E039 Hypothyroidism, unspecified: Secondary | ICD-10-CM | POA: Diagnosis not present

## 2012-08-28 DIAGNOSIS — I459 Conduction disorder, unspecified: Secondary | ICD-10-CM | POA: Diagnosis not present

## 2012-08-28 DIAGNOSIS — Z8546 Personal history of malignant neoplasm of prostate: Secondary | ICD-10-CM | POA: Diagnosis not present

## 2012-08-28 DIAGNOSIS — R262 Difficulty in walking, not elsewhere classified: Secondary | ICD-10-CM | POA: Insufficient documentation

## 2012-08-28 DIAGNOSIS — Z87891 Personal history of nicotine dependence: Secondary | ICD-10-CM | POA: Insufficient documentation

## 2012-08-28 DIAGNOSIS — I10 Essential (primary) hypertension: Secondary | ICD-10-CM | POA: Insufficient documentation

## 2012-08-28 DIAGNOSIS — Z8669 Personal history of other diseases of the nervous system and sense organs: Secondary | ICD-10-CM | POA: Insufficient documentation

## 2012-08-28 DIAGNOSIS — Z79899 Other long term (current) drug therapy: Secondary | ICD-10-CM | POA: Insufficient documentation

## 2012-08-28 DIAGNOSIS — Z7902 Long term (current) use of antithrombotics/antiplatelets: Secondary | ICD-10-CM | POA: Insufficient documentation

## 2012-08-28 DIAGNOSIS — Z87828 Personal history of other (healed) physical injury and trauma: Secondary | ICD-10-CM | POA: Diagnosis not present

## 2012-08-28 DIAGNOSIS — Z7982 Long term (current) use of aspirin: Secondary | ICD-10-CM | POA: Diagnosis not present

## 2012-08-28 DIAGNOSIS — R269 Unspecified abnormalities of gait and mobility: Secondary | ICD-10-CM | POA: Diagnosis not present

## 2012-08-28 DIAGNOSIS — Z872 Personal history of diseases of the skin and subcutaneous tissue: Secondary | ICD-10-CM | POA: Diagnosis not present

## 2012-08-28 HISTORY — DX: Type 2 diabetes mellitus without complications: E11.9

## 2012-08-28 LAB — COMPREHENSIVE METABOLIC PANEL
ALT: 51 U/L (ref 0–53)
Albumin: 3.6 g/dL (ref 3.5–5.2)
Alkaline Phosphatase: 51 U/L (ref 39–117)
BUN: 28 mg/dL — ABNORMAL HIGH (ref 6–23)
Calcium: 9.3 mg/dL (ref 8.4–10.5)
GFR calc Af Amer: 47 mL/min — ABNORMAL LOW (ref >90)
Glucose, Bld: 92 mg/dL (ref 70–99)
Potassium: 4.3 mEq/L (ref 3.5–5.1)
Sodium: 139 mEq/L (ref 135–145)
Total Protein: 7.3 g/dL (ref 6.0–8.3)

## 2012-08-28 LAB — DIFFERENTIAL
Basophils Relative: 1 % (ref 0–1)
Eosinophils Absolute: 0.2 10*3/uL (ref 0.0–0.7)
Eosinophils Relative: 5 % (ref 0–5)
Neutrophils Relative %: 54 % (ref 43–77)

## 2012-08-28 LAB — PROTIME-INR
INR: 0.97 (ref 0.00–1.49)
Prothrombin Time: 12.8 seconds (ref 11.6–15.2)

## 2012-08-28 LAB — CBC
MCH: 30.2 pg (ref 26.0–34.0)
MCHC: 33.5 g/dL (ref 30.0–36.0)
MCV: 90.2 fL (ref 78.0–100.0)
Platelets: 193 10*3/uL (ref 150–400)
RDW: 13.3 % (ref 11.5–15.5)

## 2012-08-28 LAB — URINALYSIS, ROUTINE W REFLEX MICROSCOPIC
Bilirubin Urine: NEGATIVE
Nitrite: NEGATIVE
Specific Gravity, Urine: 1.02 (ref 1.005–1.030)
Urobilinogen, UA: 0.2 mg/dL (ref 0.0–1.0)
pH: 5 (ref 5.0–8.0)

## 2012-08-28 LAB — TROPONIN I: Troponin I: <0.3 ng/mL (ref <0.30)

## 2012-08-28 LAB — POCT I-STAT TROPONIN I: Troponin i, poc: 0 ng/mL (ref 0.00–0.08)

## 2012-08-28 MED ORDER — ESOMEPRAZOLE MAGNESIUM 40 MG PO CPDR: 40.0000 mg | DELAYED_RELEASE_CAPSULE | Freq: Every day | ORAL | Status: DC

## 2012-08-28 NOTE — ED Notes (Signed)
Patient transported to CT 

## 2012-08-28 NOTE — ED Notes (Signed)
Per patient, has been feeling "woozy" and having problems ambulating for about 3 weeks-was placed on new cholesterol meds, thought it was causing symptoms-PCP wants ED to eval

## 2012-08-28 NOTE — ED Provider Notes (Signed)
History    CSN: [id number] Arrival date & time 08/28/12  1302 First MD Initiated Contact with Patient 08/28/12 1512   Pt has a pacemaker  Chief Complaint  Patient presents with  . Difficulty Walking    HPI Comments: Pt feels like his balance is off.  Over the last couple of weeks he has not been walking like he usually does.  NO falls.  Pt is legally blind so he has not noticed any visual symptoms.  No trouble with speech.  No focal numbness or weakness.  He called his doctor today because he thought it could be related to a new medications.  He was instructed to come to the ED.  Patient is a 79 y.o. male presenting with neurologic complaint. The history is provided by the patient.  Neurologic Problem The primary symptoms include loss of sensation. Primary symptoms do not include focal weakness. The symptoms began more than 1 week ago. The symptoms are unchanged.  Additional symptoms include loss of balance. Additional symptoms do not include neck stiffness, photophobia or vertigo. Medical issues do not include seizures or cerebral vascular accident.    Past Medical History  Diagnosis Date  . Cancer     prostate ca with radiation in 2005  . Hypertension   . Cellulitis of face feb 2011    prostetic rt eye, cellulitis of rt side of face  . CAD (coronary artery disease)     (non Q wave MI in Jan 2009. He had chronically ocluded right coronary artery. He had a LAD w/90% stenosis at the take off of the first diagona. He had a drug-eluding stent palced by Dr Burt Knack)  . Complete heart block     Zephyr dual chamber pacemaker per Dr Caryl Comes  . Dyslipidemia   . Hypothyroid   . Macular degeneration   . Blindness of right eye     Related to trauma in [place]  . Diabetes mellitus without complication     Past Surgical History  Procedure Laterality Date  . Eye removed Right 1997    No family history on file.  History  Substance Use Topics  . Smoking status: Former Smoker -- 2.00 packs/day  for 65 years    Types: Cigarettes    Quit date: 09/11/2000  . Smokeless tobacco: Never Used  . Alcohol Use: No      Review of Systems  HENT: Negative for neck stiffness.   Eyes: Negative for photophobia.  Neurological: Positive for loss of balance. Negative for vertigo and focal weakness.  All other systems reviewed and are negative.    Allergies  Review of patient's allergies indicates no known allergies.  Home Medications   Current Outpatient Rx  Name  Route  Sig  Dispense  Refill  . aspirin 81 MG tablet   Oral   Take 81 mg by mouth daily.           Marland Kitchen atorvastatin (LIPITOR) 20 MG tablet   Oral   Take 20 mg by mouth at bedtime.          . carboxymethylcellulose (REFRESH PLUS) 0.5 % SOLN      1 drop 3 (three) times daily as needed.         . Chelated Potassium 99 MG TABS   Oral   Take 1 tablet by mouth every other day.         . Chelated Zinc 50 MG TABS   Oral   Take by mouth every other day.           Marland Kitchen  clopidogrel (PLAVIX) 75 MG tablet   Oral   Take 75 mg by mouth daily.           . fenofibrate 54 MG tablet   Oral   Take 54 mg by mouth daily.         Marland Kitchen levothyroxine (SYNTHROID, LEVOTHROID) 137 MCG tablet   Oral   Take 137 mcg by mouth daily.         . metoprolol (TOPROL-XL) 50 MG 24 hr tablet   Oral   Take 50 mg by mouth daily.           . Multiple Vitamins-Minerals (MULTIVITAMIN,TX-MINERALS) tablet   Oral   Take 1 tablet by mouth daily.           Marland Kitchen omega-3 acid ethyl esters (LOVAZA) 1 G capsule   Oral   Take 2 g by mouth 2 (two) times daily.           Marland Kitchen esomeprazole (NEXIUM) 40 MG capsule   Oral   Take 1 capsule (40 mg total) by mouth daily.   30 capsule   0   . nitroGLYCERIN (NITROSTAT) 0.4 MG SL tablet   Sublingual   Place 0.4 mg under the tongue as needed.             BP 154/59  Pulse 61  Temp(Src) 97.9 F (36.6 C) (Oral)  Resp 17  Ht 6\' 2"  (1.88 m)  Wt 235 lb (106.595 kg)  BMI 30.16 kg/m2  SpO2  94%  Physical Exam  Nursing note and vitals reviewed. Constitutional: He is oriented to person, place, and time. He appears well-developed and well-nourished. No distress.  HENT:  Head: Normocephalic.  Right Ear: External ear normal.  Left Ear: External ear normal.  Mouth/Throat: Oropharynx is clear and moist.  S/p traumatic injury right eye   Eyes: Conjunctivae are normal. Right eye exhibits no discharge. Left eye exhibits no discharge. No scleral icterus.  As above, decreased visual acuity  Neck: Neck supple. No tracheal deviation present.  Cardiovascular: Normal rate, regular rhythm and intact distal pulses.   Pulmonary/Chest: Effort normal and breath sounds normal. No stridor. No respiratory distress. He has no wheezes. He has no rales.  Abdominal: Soft. Bowel sounds are normal. He exhibits no distension. There is no tenderness. There is no rebound and no guarding.  Musculoskeletal: He exhibits no edema and no tenderness.  Neurological: He is alert and oriented to person, place, and time. He has normal strength. No sensory deficit. Cranial nerve deficit:  no gross defecits noted. He exhibits normal muscle tone. He displays no seizure activity. Coordination normal.  No pronator drift bilateral upper extrem, able to hold both legs off bed for 5 seconds, sensation intact in all extremities, no visual field cuts, no left or right sided neglect  Skin: Skin is warm and dry. No rash noted.  Psychiatric: He has a normal mood and affect.    ED Course  Procedures (including critical care time)  Labs Reviewed  COMPREHENSIVE METABOLIC PANEL - Abnormal; Notable for the following:    BUN 28 (*)    Creatinine, Ser 1.47 (*)    AST 49 (*)    GFR calc non Af Amer 41 (*)    GFR calc Af Amer 47 (*)    All other components within normal limits  URINALYSIS, ROUTINE W REFLEX MICROSCOPIC - Abnormal; Notable for the following:    APPearance CLOUDY (*)    All other components within normal limits  PROTIME-INR  APTT  CBC  DIFFERENTIAL  TROPONIN I  POCT I-STAT TROPONIN I   Ct Head Wo Contrast  08/28/2012  *RADIOLOGY REPORT*  Clinical Data: Gait disturbance.  CT HEAD WITHOUT CONTRAST  Technique:  Contiguous axial images were obtained from the base of the skull through the vertex without contrast.  Comparison: 07/24/2010  Findings: Moderate atrophy.  Chronic microvascular ischemic changes in the white matter.  Negative for acute infarct.  Negative for hemorrhage or mass.  Calvarium intact.  Right eye prosthesis is present.  IMPRESSION: Atrophy and chronic microvascular ischemia.  No acute abnormality.   Original Report Authenticated By: Ramez Best, M.D.      1. Difficulty walking       MDM  Discussed findings with the patient and his family. He does not appear to be any obvious answer regarding his balance problems. Patient is able to walk and stand on his feet however he does feel like his balance is not as good as it usually is. It is possible the patient has had a small stroke that is not evident on the CT scan. He has a pacemaker and cannot get an MRI.  Considering these symptoms have been ongoing for the last few weeks I do not feel that there is any indication to have him admitted to the hospital.  I do recommend outpatient followup with a neurologist. Patient and family agree.  She essentially has mentioned that he has trouble with burping. The family asked if I could prescribe him an antacid medication. This has been a chronic problem for him and he is not having any trouble with chest pain or shortness of breath per       Kathalene Frames, MD 08/28/12 1745

## 2012-09-09 ENCOUNTER — Other Ambulatory Visit: Payer: Self-pay | Admitting: Neurology

## 2012-09-09 DIAGNOSIS — I10 Essential (primary) hypertension: Secondary | ICD-10-CM | POA: Diagnosis not present

## 2012-09-09 DIAGNOSIS — E785 Hyperlipidemia, unspecified: Secondary | ICD-10-CM | POA: Diagnosis not present

## 2012-09-09 DIAGNOSIS — R269 Unspecified abnormalities of gait and mobility: Secondary | ICD-10-CM | POA: Diagnosis not present

## 2012-09-09 DIAGNOSIS — I6350 Cerebral infarction due to unspecified occlusion or stenosis of unspecified cerebral artery: Secondary | ICD-10-CM

## 2012-09-17 ENCOUNTER — Ambulatory Visit (HOSPITAL_COMMUNITY)
Admission: RE | Admit: 2012-09-17 | Discharge: 2012-09-17 | Disposition: A | Discharge status: Home / Self Care | Payer: Medicare Other | Source: Ambulatory Visit | Attending: Neurology | Admitting: Neurology

## 2012-09-17 DIAGNOSIS — I2510 Atherosclerotic heart disease of native coronary artery without angina pectoris: Secondary | ICD-10-CM | POA: Diagnosis not present

## 2012-09-17 DIAGNOSIS — I6529 Occlusion and stenosis of unspecified carotid artery: Secondary | ICD-10-CM | POA: Insufficient documentation

## 2012-09-17 DIAGNOSIS — E119 Type 2 diabetes mellitus without complications: Secondary | ICD-10-CM | POA: Insufficient documentation

## 2012-09-17 DIAGNOSIS — Z87891 Personal history of nicotine dependence: Secondary | ICD-10-CM | POA: Insufficient documentation

## 2012-09-17 DIAGNOSIS — I6350 Cerebral infarction due to unspecified occlusion or stenosis of unspecified cerebral artery: Secondary | ICD-10-CM

## 2012-09-17 DIAGNOSIS — I10 Essential (primary) hypertension: Secondary | ICD-10-CM | POA: Diagnosis not present

## 2012-09-17 DIAGNOSIS — E785 Hyperlipidemia, unspecified: Secondary | ICD-10-CM

## 2012-09-17 DIAGNOSIS — I658 Occlusion and stenosis of other precerebral arteries: Secondary | ICD-10-CM | POA: Insufficient documentation

## 2012-09-22 ENCOUNTER — Other Ambulatory Visit: Payer: Self-pay | Admitting: *Deleted

## 2012-09-22 NOTE — Telephone Encounter (Signed)
I don't see in chart that patient is on Nexium

## 2012-09-22 NOTE — Telephone Encounter (Signed)
Please print all RX'S. They are mail order. I will send chart back for review. Patient needs clobetasol written as well. Last visit shows cream but express script sent over for soln. Thanks.

## 2012-09-23 ENCOUNTER — Other Ambulatory Visit: Payer: Self-pay | Admitting: Family Medicine

## 2012-09-23 MED ORDER — ESOMEPRAZOLE MAGNESIUM 40 MG PO CPDR: 40.0000 mg | DELAYED_RELEASE_CAPSULE | Freq: Every day | ORAL | Status: DC

## 2012-09-23 MED ORDER — CLOBETASOL PROPIONATE 0.05 % EX OINT: TOPICAL_OINTMENT | Freq: Two times a day (BID) | CUTANEOUS | Status: DC

## 2012-09-23 MED ORDER — FENOFIBRATE 54 MG PO TABS: 54.0000 mg | ORAL_TABLET | Freq: Every day | ORAL | Status: DC

## 2012-09-24 ENCOUNTER — Other Ambulatory Visit: Payer: Self-pay | Admitting: Family Medicine

## 2012-09-24 ENCOUNTER — Other Ambulatory Visit: Payer: Self-pay

## 2012-09-24 ENCOUNTER — Telehealth: Payer: Self-pay | Admitting: *Deleted

## 2012-09-24 NOTE — Telephone Encounter (Signed)
Mail order  Do not see directions in chart for this med  Need to be filled in   Print and have nurse call patient to pick up

## 2012-09-24 NOTE — Telephone Encounter (Signed)
Caretaker called and said she picked up some mail order scripts, but she was missing 2! She still needs clobetisol solution and freestyle lite test strips. On epic, i see where the clobetisol was filled, but where is it?

## 2012-09-24 NOTE — Telephone Encounter (Signed)
Rxs left up front for Clobetasol and freestyle test strips. Pt notified

## 2012-09-25 ENCOUNTER — Other Ambulatory Visit: Payer: Self-pay

## 2012-09-25 MED ORDER — CLOBETASOL PROPIONATE 0.05 % EX OINT: TOPICAL_OINTMENT | Freq: Two times a day (BID) | CUTANEOUS | Status: AC

## 2012-09-25 NOTE — Telephone Encounter (Signed)
Rx for the lotion was handwritten today.

## 2012-09-30 ENCOUNTER — Ambulatory Visit (INDEPENDENT_AMBULATORY_CARE_PROVIDER_SITE_OTHER): Payer: Medicare Other | Admitting: Family Medicine

## 2012-09-30 ENCOUNTER — Ambulatory Visit (INDEPENDENT_AMBULATORY_CARE_PROVIDER_SITE_OTHER): Payer: Medicare Other

## 2012-09-30 ENCOUNTER — Encounter: Payer: Self-pay | Admitting: Family Medicine

## 2012-09-30 VITALS — BP 159/82 | HR 61 | Temp 97.7°F | Ht 71.5 in | Wt 240.8 lb

## 2012-09-30 DIAGNOSIS — R270 Ataxia, unspecified: Secondary | ICD-10-CM

## 2012-09-30 DIAGNOSIS — R279 Unspecified lack of coordination: Secondary | ICD-10-CM

## 2012-09-30 DIAGNOSIS — I25810 Atherosclerosis of coronary artery bypass graft(s) without angina pectoris: Secondary | ICD-10-CM | POA: Diagnosis not present

## 2012-09-30 DIAGNOSIS — N184 Chronic kidney disease, stage 4 (severe): Secondary | ICD-10-CM | POA: Diagnosis not present

## 2012-09-30 DIAGNOSIS — E119 Type 2 diabetes mellitus without complications: Secondary | ICD-10-CM | POA: Diagnosis not present

## 2012-09-30 DIAGNOSIS — E785 Hyperlipidemia, unspecified: Secondary | ICD-10-CM | POA: Diagnosis not present

## 2012-09-30 DIAGNOSIS — Z950 Presence of cardiac pacemaker: Secondary | ICD-10-CM | POA: Diagnosis not present

## 2012-09-30 DIAGNOSIS — E039 Hypothyroidism, unspecified: Secondary | ICD-10-CM | POA: Diagnosis not present

## 2012-09-30 DIAGNOSIS — E1129 Type 2 diabetes mellitus with other diabetic kidney complication: Secondary | ICD-10-CM | POA: Insufficient documentation

## 2012-09-30 LAB — POCT CBC
Granulocyte percent: 58 %G (ref 37–80)
HCT, POC: 40.9 % — AB (ref 43.5–53.7)
Hemoglobin: 13.7 g/dL — AB (ref 14.1–18.1)
Lymph, poc: 1.8 (ref 0.6–3.4)
MCH, POC: 30 pg (ref 27–31.2)
MCHC: 33.4 g/dL (ref 31.8–35.4)
MCV: 89.8 fL (ref 80–97)
MPV: 8.5 fL (ref 0–99.8)
POC Granulocyte: 3 (ref 2–6.9)
POC LYMPH PERCENT: 34.6 %L (ref 10–50)
Platelet Count, POC: 206 10*3/uL (ref 142–424)
RBC: 4.6 M/uL — AB (ref 4.69–6.13)
RDW, POC: 13.1 %
WBC: 5.2 10*3/uL (ref 4.6–10.2)

## 2012-09-30 LAB — COMPLETE METABOLIC PANEL WITH GFR
ALT: 39 U/L (ref 0–53)
AST: 40 U/L — ABNORMAL HIGH (ref 0–37)
Albumin: 4.5 g/dL (ref 3.5–5.2)
Alkaline Phosphatase: 44 U/L (ref 39–117)
BUN: 31 mg/dL — ABNORMAL HIGH (ref 6–23)
CO2: 29 mEq/L (ref 19–32)
Calcium: 10 mg/dL (ref 8.4–10.5)
Chloride: 101 mEq/L (ref 96–112)
Creat: 1.63 mg/dL — ABNORMAL HIGH (ref 0.50–1.35)
GFR, Est African American: 43 mL/min — ABNORMAL LOW
GFR, Est Non African American: 37 mL/min — ABNORMAL LOW
Glucose, Bld: 112 mg/dL — ABNORMAL HIGH (ref 70–99)
Potassium: 5 mEq/L (ref 3.5–5.3)
Sodium: 138 mEq/L (ref 135–145)
Total Bilirubin: 0.6 mg/dL (ref 0.3–1.2)
Total Protein: 7.3 g/dL (ref 6.0–8.3)

## 2012-09-30 LAB — TSH: TSH: 9.884 u[IU]/mL — ABNORMAL HIGH (ref 0.350–4.500)

## 2012-09-30 LAB — POCT GLYCOSYLATED HEMOGLOBIN (HGB A1C): Hemoglobin A1C: 6.5

## 2012-09-30 LAB — FOLATE: Folate: >20 ng/mL

## 2012-09-30 LAB — VITAMIN B12: Vitamin B-12: 513 pg/mL (ref 211–911)

## 2012-09-30 NOTE — Progress Notes (Signed)
Patient ID: [name], male   DOB: 1921-07-13, 79 y.o.   MRN: [id number] SUBJECTIVE: HPI: Patient is here for follow up of Diabetes Mellitus.Symptoms of DM:has had no Nocturia ,deniesUrinary Frequency ,denies Blurred vision ,deniesDizziness,denies.Dysuria,deniesparesthesias, deniesextremity pain or ulcers.Marland Kitchendenieschest pain. .has hadan annual eye exam. do check the feet. doescheck CBGs. Average CBG:102_______.Marland Kitchen deniesto episodes of hypoglycemia. doeshave an emergency hypoglycemic plan. admits toCompliance with medications. deniesProblems with medications. Recently has had acute difficulty walking and was evaluated in the ED. CT of brain did not reveal a CVA. Saw a neurologist for follow up. Dr Arsenio Katz thought he may have had a CVA but no neuroimaging evidence. He has difficulty walking without losing balance. Ambulates with a cane.  PMH/PSH: reviewed/updated in Epic  SH/FH: reviewed/updated in Epic  Allergies: reviewed/updated in Epic  Medications: reviewed/updated in Epic  Immunizations: reviewed/updated in Epic  ROS: As above in the HPI. All other systems are stable or negative.  OBJECTIVE: APPEARANCE:  Patient in no acute distress.The patient appeared well nourished and normally developed. Acyanotic. Waist:Obese VITAL SIGNS:BP 159/82  Pulse 61  Temp(Src) 97.7 F (36.5 C) (Oral)  Ht 5' 11.5" (1.816 m)  Wt 240 lb 12.8 oz (109.226 kg)  BMI 33.12 kg/m2   SKIN: warm and  Dry without overt rashes, tattoos and scars  HEAD and Neck: without JVD, Head and scalp: normal Eyes:No scleral icterus.  eye:  absent right eye Ears: Auricle normal, canal normal, Tympanic membranes normal, insufflation normal. Nose: normal Throat: normal Neck & thyroid: normal  CHEST & LUNGS: Chest wall: normal Lungs: Clear  CVS: Reveals the PMI to be normally located. Regular rhythm, First and Second Heart sounds are normal,  absence of murmurs, rubs or gallops. Peripheral  vasculature: Radial pulses: normal Dorsal pedis pulses: normal  ABDOMEN:  Appearance: normal Benign,, no organomegaly, no masses, no Abdominal Aortic enlargement. No Guarding , no rebound. No Bruits. Bowel sounds: normal  RECTAL: N/A GU: N/A  EXTREMETIES:  Trace edema.   MUSCULOSKELETAL:  Spine: normal Joints: intact  NEUROLOGIC: oriented to time,place and person; Strength is mildly reduced in both lower extremities.. Gait unsteady and a bit ataxic.  ASSESSMENT: Ataxia - Plan: DG Lumbar Spine 2-3 Views, POCT CBC, Vitamin B12, Folate  DYSLIPIDEMIA - Plan: COMPLETE METABOLIC PANEL WITH GFR, NMR Lipoprofile with Lipids  PACEMAKER, PERMANENT  Chronic kidney disease (CKD), stage IV (severe) - Plan: COMPLETE METABOLIC PANEL WITH GFR  CAD (coronary artery disease) of artery bypass graft - Plan: COMPLETE METABOLIC PANEL WITH GFR  Unspecified hypothyroidism - Plan: TSH  DM (diabetes mellitus) - Plan: POCT glycosylated hemoglobin (Hb A1C)    PLAN: Rolling walker prescribed on Rx pad.  Orders Placed This Encounter  Procedures  . DG Lumbar Spine 2-3 Views    Standing Status: Future     Number of Occurrences: 1     Standing Expiration Date: 11/30/2013    Order Specific Question:  Reason for Exam (SYMPTOM  OR DIAGNOSIS REQUIRED)    Answer:  ataxia    Order Specific Question:  Preferred imaging location?    Answer:  Internal  . COMPLETE METABOLIC PANEL WITH GFR  . Vitamin B12  . Folate  . NMR Lipoprofile with Lipids  . TSH  . POCT CBC  . POCT glycosylated hemoglobin (Hb A1C)   Results for orders placed in visit on 09/30/12 (from the past 24 hour(s))  POCT CBC     Status: Abnormal   Collection Time    09/30/12 12:22 PM  Result Value Range   WBC 5.2  4.6 - 10.2 K/uL   Lymph, poc 1.8  0.6 - 3.4   POC LYMPH PERCENT 34.6  10 - 50 %L   POC Granulocyte 3.0  2 - 6.9   Granulocyte percent 58.0  37 - 80 %G   RBC 4.6 (*) 4.69 - 6.13 M/uL   Hemoglobin 13.7 (*) 14.1 -  18.1 g/dL   HCT, POC 40.9 (*) 43.5 - 53.7 %   MCV 89.8  80 - 97 fL   MCH, POC 30.0  27 - 31.2 pg   MCHC 33.4  31.8 - 35.4 g/dL   RDW, POC 13.1     Platelet Count, POC 206.0  142 - 424 K/uL   MPV 8.5  0 - 99.8 fL  POCT GLYCOSYLATED HEMOGLOBIN (HGB A1C)     Status: None   Collection Time    09/30/12 12:24 PM      Result Value Range   Hemoglobin A1C 6.5     No orders of the defined types were placed in this encounter.   [facility] reading (PRIMARY) by  Dr. Yaakov Guthrie: Preliminary:old compression and degenerative changes involving L5-S1. With loss of the disk spaces L4 to S1. disucssed that it is possible that this is due to spinal stenosis. Will schedule a Ct of the Lumbar Spine. Patient has a pacemaker and therefore MRI is contraindicated.                         RTc in  3 months  Chiamaka Latka P. Jacelyn Grip, M.D.

## 2012-10-01 LAB — NMR LIPOPROFILE WITH LIPIDS
Cholesterol, Total: 188 mg/dL (ref <200)
HDL Particle Number: 31.8 umol/L (ref >=30.5)
HDL Size: 8.3 nm — ABNORMAL LOW (ref >=9.2)
HDL-C: 40 mg/dL (ref >=40)
LDL (calc): 90 mg/dL (ref <100)
LDL Particle Number: 1701 nmol/L — ABNORMAL HIGH (ref <1000)
LDL Size: 19.8 nm — ABNORMAL LOW (ref >20.5)
LP-IR Score: 79 — ABNORMAL HIGH (ref <=45)
Large HDL-P: <1.3 umol/L — ABNORMAL LOW (ref >=4.8)
Large VLDL-P: 6.6 nmol/L — ABNORMAL HIGH (ref <=2.7)
Small LDL Particle Number: 1335 nmol/L — ABNORMAL HIGH (ref <=527)
Triglycerides: 292 mg/dL — ABNORMAL HIGH (ref <150)
VLDL Size: 48.6 nm — ABNORMAL HIGH (ref <=46.6)

## 2012-10-01 NOTE — Progress Notes (Signed)
Quick Note:  Labs abnormal. Needs CT Lumbar spine. Incidental Very small aneurysm. The CT was ordered. Need to make sure appointment made. Thanks.  ______

## 2012-10-02 ENCOUNTER — Telehealth: Payer: Self-pay

## 2012-10-02 NOTE — Telephone Encounter (Signed)
[name], Was [name] CT exam sch   thanks

## 2012-10-03 ENCOUNTER — Ambulatory Visit (HOSPITAL_COMMUNITY)
Admission: RE | Admit: 2012-10-03 | Discharge: 2012-10-03 | Disposition: A | Discharge status: Home / Self Care | Payer: Medicare Other | Source: Ambulatory Visit | Attending: Family Medicine | Admitting: Family Medicine

## 2012-10-03 DIAGNOSIS — M5137 Other intervertebral disc degeneration, lumbosacral region: Secondary | ICD-10-CM | POA: Insufficient documentation

## 2012-10-03 DIAGNOSIS — M51379 Other intervertebral disc degeneration, lumbosacral region without mention of lumbar back pain or lower extremity pain: Secondary | ICD-10-CM | POA: Insufficient documentation

## 2012-10-03 DIAGNOSIS — M47817 Spondylosis without myelopathy or radiculopathy, lumbosacral region: Secondary | ICD-10-CM | POA: Diagnosis not present

## 2012-10-03 DIAGNOSIS — R279 Unspecified lack of coordination: Secondary | ICD-10-CM | POA: Insufficient documentation

## 2012-10-03 DIAGNOSIS — M5380 Other specified dorsopathies, site unspecified: Secondary | ICD-10-CM | POA: Diagnosis not present

## 2012-10-03 DIAGNOSIS — R270 Ataxia, unspecified: Secondary | ICD-10-CM

## 2012-10-03 DIAGNOSIS — R262 Difficulty in walking, not elsewhere classified: Secondary | ICD-10-CM | POA: Diagnosis not present

## 2012-10-03 NOTE — Telephone Encounter (Signed)
Patient scheduled to have CT today

## 2012-10-05 ENCOUNTER — Other Ambulatory Visit: Payer: Self-pay | Admitting: Family Medicine

## 2012-10-05 DIAGNOSIS — E039 Hypothyroidism, unspecified: Secondary | ICD-10-CM

## 2012-10-05 DIAGNOSIS — R270 Ataxia, unspecified: Secondary | ICD-10-CM

## 2012-10-05 MED ORDER — LEVOTHYROXINE SODIUM 150 MCG PO TABS: 150.0000 ug | ORAL_TABLET | Freq: Every day | ORAL | Status: DC

## 2012-10-06 ENCOUNTER — Other Ambulatory Visit: Payer: Self-pay

## 2012-10-06 DIAGNOSIS — E039 Hypothyroidism, unspecified: Secondary | ICD-10-CM

## 2012-10-06 MED ORDER — LEVOTHYROXINE SODIUM 150 MCG PO TABS: 150.0000 ug | ORAL_TABLET | Freq: Every day | ORAL | Status: DC

## 2012-10-06 NOTE — Telephone Encounter (Signed)
Pt awarae to pick up rx zt front desk

## 2012-10-06 NOTE — Telephone Encounter (Signed)
Pt aware of referral and will call on Wednesday for status  rx for thryoid pt will pick up  Synthroid 150 mcg for mail order

## 2012-10-09 DIAGNOSIS — M47817 Spondylosis without myelopathy or radiculopathy, lumbosacral region: Secondary | ICD-10-CM | POA: Diagnosis not present

## 2012-10-14 DIAGNOSIS — L851 Acquired keratosis [keratoderma] palmaris et plantaris: Secondary | ICD-10-CM | POA: Diagnosis not present

## 2012-10-14 DIAGNOSIS — B351 Tinea unguium: Secondary | ICD-10-CM | POA: Diagnosis not present

## 2012-10-14 DIAGNOSIS — I70209 Unspecified atherosclerosis of native arteries of extremities, unspecified extremity: Secondary | ICD-10-CM | POA: Diagnosis not present

## 2012-10-30 ENCOUNTER — Encounter: Payer: Self-pay | Admitting: Internal Medicine

## 2012-10-30 ENCOUNTER — Ambulatory Visit (INDEPENDENT_AMBULATORY_CARE_PROVIDER_SITE_OTHER): Payer: Medicare Other | Admitting: Internal Medicine

## 2012-10-30 ENCOUNTER — Telehealth: Payer: Self-pay | Admitting: Internal Medicine

## 2012-10-30 VITALS — BP 138/72 | HR 61 | Ht 71.5 in | Wt 240.0 lb

## 2012-10-30 DIAGNOSIS — Z950 Presence of cardiac pacemaker: Secondary | ICD-10-CM

## 2012-10-30 DIAGNOSIS — I2510 Atherosclerotic heart disease of native coronary artery without angina pectoris: Secondary | ICD-10-CM

## 2012-10-30 DIAGNOSIS — R42 Dizziness and giddiness: Secondary | ICD-10-CM | POA: Diagnosis not present

## 2012-10-30 DIAGNOSIS — I4891 Unspecified atrial fibrillation: Secondary | ICD-10-CM

## 2012-10-30 DIAGNOSIS — I442 Atrioventricular block, complete: Secondary | ICD-10-CM

## 2012-10-30 LAB — PACEMAKER DEVICE OBSERVATION
AL AMPLITUDE: 2.6 mv
AL IMPEDENCE PM: 392 Ohm
BATTERY VOLTAGE: 2.78 V
RV LEAD AMPLITUDE: 6.7 mv

## 2012-10-30 NOTE — Assessment & Plan Note (Signed)
He has episodes of atrial fibrillation. With the aforementioned event it is concerning that he should be on anticoagulation  And probably not withstanding the his balance issues he should probably be on anticoagulants

## 2012-10-30 NOTE — Assessment & Plan Note (Signed)
Stable post pacing 

## 2012-10-30 NOTE — Patient Instructions (Signed)
Your physician wants you to follow-up in: 1 year with Dr. Caryl Comes. You will receive a reminder letter in the mail two months in advance. If you don't receive a letter, please call our office to schedule the follow-up appointment.  Call our office when you get home to confirm your medications.[phone] leave a message for Dr. Caryl Comes.

## 2012-10-30 NOTE — Telephone Encounter (Signed)
Routing...

## 2012-10-30 NOTE — Telephone Encounter (Signed)
New Prob     Wanted notify Dr. Caryl Comes that Dr. Jacelyn Grip in [place] wrote the script for PLAVIX. Would like to speak to nurse.

## 2012-10-30 NOTE — Assessment & Plan Note (Signed)
Without angina. We'll continue his current medications

## 2012-10-30 NOTE — Assessment & Plan Note (Signed)
The patient's device was interrogated.  The information was reviewed. No changes were made in the programming.    

## 2012-10-30 NOTE — Progress Notes (Signed)
Patient Care Team: Chipper Herb, MD as PCP - General   HPI  [name] is a 79 y.o. male seen in followup for a pacemaker implanted for CHB  2009.   He also has CAD with a chronically occluded right coronary artery. He had an LAD with 90% stenosis at the take off of the first diagonal. He had a drug-eluting stent placed by Dr. Burt Knack 2009  Myoview scan done 2011 was negative for ischemia;    his big complaint is dizziness upon standing. He has seen a neurologist undertook CT scanning of his head and back and carotid Dopplers all of which were negative. This is a reproducible wooziness as it were; there is no sensation of motion. He has no symptoms lying or sitting. His gait is wide-based and his balance poor   Past Medical History  Diagnosis Date  . Cancer     prostate ca with radiation in 2005  . Hypertension   . Cellulitis of face feb 2011    prostetic rt eye, cellulitis of rt side of face  . CAD (coronary artery disease)     (non Q wave MI in Jan 2009. He had chronically ocluded right coronary artery. He had a LAD w/90% stenosis at the take off of the first diagona. He had a drug-eluding stent palced by Dr Burt Knack)  . Complete heart block     Zephyr dual chamber pacemaker per Dr Caryl Comes  . Dyslipidemia   . Hypothyroid   . Macular degeneration   . Blindness of right eye     Related to trauma in [place]  . Diabetes mellitus without complication     Past Surgical History  Procedure Laterality Date  . Eye removed Right 1997    Current Outpatient Prescriptions  Medication Sig Dispense Refill  . aspirin 81 MG tablet Take 81 mg by mouth daily.        Marland Kitchen atorvastatin (LIPITOR) 20 MG tablet Take 20 mg by mouth at bedtime.       . carboxymethylcellulose (REFRESH PLUS) 0.5 % SOLN 1 drop 3 (three) times daily as needed.      . Chelated Potassium 99 MG TABS Take 1 tablet by mouth every other day.      . Chelated Zinc 50 MG TABS Take by mouth every other day.        . clobetasol  (TEMOVATE) 0.05 % external solution Apply 1 application topically 2 (two) times daily.      . clobetasol cream (TEMOVATE) [id number] % Apply 1 application topically 2 (two) times daily as needed.      . clopidogrel (PLAVIX) 75 MG tablet Take 75 mg by mouth daily.        Marland Kitchen esomeprazole (NEXIUM) 40 MG capsule Take 1 capsule (40 mg total) by mouth daily.  90 capsule  0  . fenofibrate 54 MG tablet Take 1 tablet (54 mg total) by mouth daily.  90 tablet  0  . levothyroxine (SYNTHROID, LEVOTHROID) 150 MCG tablet Take 1 tablet (150 mcg total) by mouth daily before breakfast.  90 tablet  3  . metoprolol (TOPROL-XL) 50 MG 24 hr tablet Take 50 mg by mouth daily.        . Multiple Vitamins-Minerals (MULTIVITAMIN,TX-MINERALS) tablet Take 1 tablet by mouth daily.        . nitroGLYCERIN (NITROSTAT) 0.4 MG SL tablet Place 0.4 mg under the tongue as needed.        Marland Kitchen omega-3 acid ethyl esters (LOVAZA) 1  G capsule Take 2 g by mouth 2 (two) times daily.         No current facility-administered medications for this visit.    No Known Allergies  Review of Systems negative except from HPI and PMH  Physical Exam BP 138/72  Pulse 61  Ht 5' 11.5" (1.816 m)  Wt 240 lb (108.863 kg)  BMI 33.01 kg/m2  SpO2 95% Well developed and well nourished in no acute distress HENT normal E scleral and icterus clear Neck Supple JVP flat; carotids brisk and full Clear to ausculation regular rate and rhythm  +s4 Soft with active bowel sounds No clubbing cyanosis Trace Edema Alert and oriented, wide-based and unstable gaitSkin Warm and Dry    Assessment and  Plan

## 2012-10-30 NOTE — Telephone Encounter (Signed)
Spoke with [name], he wanted to let dr Caryl Comes know dr Jacelyn Grip writes the script for plavix. The [name] is uncertain why he is taking it and is anxious about stopping it if he could. Will discuss with dr Caryl Comes.

## 2012-10-30 NOTE — Assessment & Plan Note (Signed)
He has had a marked worsening in his orthostatic dizziness. There is no evidence of fall in blood pressure. Given the acuity I wonder whether there hasn't been intercurrent neurological event. Apparently others are thought that possible as well. In this regard he has had atrial fibrillation. He is not on anticoagulant therapy. He appears to still be in double platelet therapy although the notes that were reviewed from 2009 admission that they could be stopped about 2010. He will let me know who his current cardiologist is and we will have been reviewed this issue

## 2012-10-31 NOTE — Telephone Encounter (Signed)
Follow-up:    Patient would like to know if he needs to stop taking his plavix.  Please call back.

## 2012-10-31 NOTE — Telephone Encounter (Signed)
Spoke with [name], according to dr Jodi Mourning office note the neurologist the [name] saw in April thought he may have had a stroke. Called and spoke with dr Arsenio Katz office, they are going to check to make sure from a stroke prospective he does not need the plavix and let me know. Then I will be able to discuss with dr Caryl Comes. [name] agreed with this plan.

## 2012-11-04 NOTE — Telephone Encounter (Signed)
New problem   Melissa need you to give her a call.

## 2012-11-04 NOTE — Telephone Encounter (Signed)
Thanks for your effort in looking into this

## 2012-11-04 NOTE — Telephone Encounter (Signed)
Spoke with [place], per dr Arsenio Katz the pt will need to cont plavix and asa because of his recent stroke. Pt reports he stopped the plavix three days ago and has had no change in symptoms. Encouraged pt to restart and discuss at his follow up appt in June with dr Arsenio Katz. Pt voiced understanding.

## 2012-11-04 NOTE — Telephone Encounter (Signed)
F/u    Pt calling & still wants to know if he's to stop plavix or continue taking it

## 2012-11-11 ENCOUNTER — Encounter: Payer: Self-pay | Admitting: Internal Medicine

## 2012-11-11 DIAGNOSIS — R42 Dizziness and giddiness: Secondary | ICD-10-CM | POA: Diagnosis not present

## 2012-11-11 DIAGNOSIS — I6990 Unspecified sequelae of unspecified cerebrovascular disease: Secondary | ICD-10-CM | POA: Diagnosis not present

## 2012-11-11 DIAGNOSIS — R269 Unspecified abnormalities of gait and mobility: Secondary | ICD-10-CM | POA: Diagnosis not present

## 2012-11-11 DIAGNOSIS — G609 Hereditary and idiopathic neuropathy, unspecified: Secondary | ICD-10-CM | POA: Diagnosis not present

## 2012-12-23 ENCOUNTER — Other Ambulatory Visit: Payer: Self-pay | Admitting: Family Medicine

## 2012-12-25 ENCOUNTER — Other Ambulatory Visit: Payer: Self-pay | Admitting: Family Medicine

## 2013-01-06 DIAGNOSIS — L851 Acquired keratosis [keratoderma] palmaris et plantaris: Secondary | ICD-10-CM | POA: Diagnosis not present

## 2013-01-06 DIAGNOSIS — I70209 Unspecified atherosclerosis of native arteries of extremities, unspecified extremity: Secondary | ICD-10-CM | POA: Diagnosis not present

## 2013-01-06 DIAGNOSIS — B351 Tinea unguium: Secondary | ICD-10-CM | POA: Diagnosis not present

## 2013-02-05 ENCOUNTER — Encounter: Payer: Self-pay | Admitting: Internal Medicine

## 2013-02-05 DIAGNOSIS — I442 Atrioventricular block, complete: Secondary | ICD-10-CM

## 2013-02-05 DIAGNOSIS — Z950 Presence of cardiac pacemaker: Secondary | ICD-10-CM | POA: Diagnosis not present

## 2013-02-10 ENCOUNTER — Telehealth: Payer: Self-pay | Admitting: Family Medicine

## 2013-02-13 NOTE — Telephone Encounter (Signed)
Barnett Applebaum, he says he needs all meds? Is this list correct?, also he says he needs xanax, this is not on med list. Pls. Let me know?

## 2013-02-16 ENCOUNTER — Other Ambulatory Visit: Payer: Self-pay | Admitting: Family Medicine

## 2013-02-19 ENCOUNTER — Other Ambulatory Visit: Payer: Self-pay | Admitting: Family Medicine

## 2013-02-19 DIAGNOSIS — E039 Hypothyroidism, unspecified: Secondary | ICD-10-CM

## 2013-02-19 MED ORDER — LEVOTHYROXINE SODIUM 150 MCG PO TABS: 150.0000 ug | ORAL_TABLET | Freq: Every day | ORAL | Status: DC

## 2013-02-19 MED ORDER — OMEGA-3-ACID ETHYL ESTERS 1 G PO CAPS: 2.0000 g | ORAL_CAPSULE | Freq: Two times a day (BID) | ORAL | Status: DC

## 2013-02-19 MED ORDER — ESOMEPRAZOLE MAGNESIUM 40 MG PO CPDR: DELAYED_RELEASE_CAPSULE | ORAL | Status: DC

## 2013-02-19 MED ORDER — CLOPIDOGREL BISULFATE 75 MG PO TABS: 75.0000 mg | ORAL_TABLET | Freq: Every day | ORAL | Status: DC

## 2013-02-19 MED ORDER — ALPRAZOLAM 0.5 MG PO TBDP: 0.5000 mg | ORAL_TABLET | Freq: Two times a day (BID) | ORAL | Status: DC | PRN

## 2013-02-19 MED ORDER — METOPROLOL SUCCINATE ER 50 MG PO TB24: 50.0000 mg | ORAL_TABLET | Freq: Every day | ORAL | Status: DC

## 2013-02-19 MED ORDER — ATORVASTATIN CALCIUM 20 MG PO TABS: 20.0000 mg | ORAL_TABLET | Freq: Every day | ORAL | Status: DC

## 2013-02-19 MED ORDER — NITROGLYCERIN 0.4 MG SL SUBL: 0.4000 mg | SUBLINGUAL_TABLET | SUBLINGUAL | Status: DC | PRN

## 2013-02-19 MED ORDER — FENOFIBRATE 54 MG PO TABS: ORAL_TABLET | ORAL | Status: DC

## 2013-02-19 NOTE — Telephone Encounter (Signed)
Prescription renewed in EPIC. Sent to mail order. Xanax # 20

## 2013-02-20 NOTE — Telephone Encounter (Signed)
Pt aware rx ready for pick up. 

## 2013-03-05 ENCOUNTER — Encounter: Payer: Self-pay | Admitting: Family Medicine

## 2013-03-05 ENCOUNTER — Ambulatory Visit (INDEPENDENT_AMBULATORY_CARE_PROVIDER_SITE_OTHER): Payer: Medicare Other | Admitting: Family Medicine

## 2013-03-05 VITALS — BP 141/68 | HR 72 | Temp 97.8°F | Wt 235.0 lb

## 2013-03-05 DIAGNOSIS — Z79899 Other long term (current) drug therapy: Secondary | ICD-10-CM | POA: Diagnosis not present

## 2013-03-05 DIAGNOSIS — F411 Generalized anxiety disorder: Secondary | ICD-10-CM | POA: Diagnosis not present

## 2013-03-05 DIAGNOSIS — I4891 Unspecified atrial fibrillation: Secondary | ICD-10-CM

## 2013-03-05 DIAGNOSIS — I442 Atrioventricular block, complete: Secondary | ICD-10-CM | POA: Diagnosis not present

## 2013-03-05 DIAGNOSIS — I25810 Atherosclerosis of coronary artery bypass graft(s) without angina pectoris: Secondary | ICD-10-CM | POA: Diagnosis not present

## 2013-03-05 DIAGNOSIS — E785 Hyperlipidemia, unspecified: Secondary | ICD-10-CM

## 2013-03-05 DIAGNOSIS — R42 Dizziness and giddiness: Secondary | ICD-10-CM

## 2013-03-05 DIAGNOSIS — E039 Hypothyroidism, unspecified: Secondary | ICD-10-CM | POA: Diagnosis not present

## 2013-03-05 DIAGNOSIS — E119 Type 2 diabetes mellitus without complications: Secondary | ICD-10-CM | POA: Diagnosis not present

## 2013-03-05 DIAGNOSIS — E538 Deficiency of other specified B group vitamins: Secondary | ICD-10-CM | POA: Diagnosis not present

## 2013-03-05 LAB — POCT CBC
Granulocyte percent: 58.4 %G (ref 37–80)
HCT, POC: 38.6 % — AB (ref 43.5–53.7)
Hemoglobin: 12.7 g/dL — AB (ref 14.1–18.1)
Lymph, poc: 1.5 (ref 0.6–3.4)
MCH, POC: 29.1 pg (ref 27–31.2)
MCHC: 32.8 g/dL (ref 31.8–35.4)
MCV: 88.7 fL (ref 80–97)
MPV: 8.2 fL (ref 0–99.8)
POC Granulocyte: 2.5 (ref 2–6.9)
POC LYMPH PERCENT: 33.8 %L (ref 10–50)
Platelet Count, POC: 228 10*3/uL (ref 142–424)
RBC: 4.4 M/uL — AB (ref 4.69–6.13)
RDW, POC: 13.3 %
WBC: 4.3 10*3/uL — AB (ref 4.6–10.2)

## 2013-03-05 MED ORDER — OMEGA-3-ACID ETHYL ESTERS 1 G PO CAPS: 2.0000 g | ORAL_CAPSULE | Freq: Two times a day (BID) | ORAL | Status: DC

## 2013-03-05 MED ORDER — ALPRAZOLAM 0.5 MG PO TABS: 0.5000 mg | ORAL_TABLET | Freq: Every evening | ORAL | Status: DC | PRN

## 2013-03-05 MED ORDER — FENOFIBRATE 54 MG PO TABS: ORAL_TABLET | ORAL | Status: DC

## 2013-03-05 MED ORDER — CLOBETASOL PROPIONATE 0.05 % EX CREA: 1.0000 "application " | TOPICAL_CREAM | Freq: Two times a day (BID) | CUTANEOUS | Status: DC | PRN

## 2013-03-05 MED ORDER — METOPROLOL SUCCINATE ER 50 MG PO TB24: 50.0000 mg | ORAL_TABLET | Freq: Every day | ORAL | Status: DC

## 2013-03-05 MED ORDER — LEVOTHYROXINE SODIUM 150 MCG PO TABS: 150.0000 ug | ORAL_TABLET | Freq: Every day | ORAL | Status: DC

## 2013-03-05 NOTE — Progress Notes (Signed)
Patient ID: [name], male   DOB: Feb 06, 1922, 79 y.o.   MRN: [id number]  SUBJECTIVE: CC: Chief Complaint  Patient presents with  . Follow-up    wants refills and wants more xanax than 20 .wants off atorvastatain . c/o dizziness and thinks its atorvastatin . saw dr Caryl Comes and he is aware of the dizziness and they think he had mild stroke.     HPI: Anxiety and cannot sleep at nights. Wife died. Patient is here for follow up of hyperlipidemia: denies Headache;denies Chest Pain;denies weakness;denies Shortness of Breath and orthopnea;denies Visual changes;denies palpitations;denies cough;denies pedal edema;denies symptoms of TIA or stroke;deniesClaudication symptoms. admits to Compliance with medications; denies Problems with medications.  Past Medical History  Diagnosis Date  . Cancer     prostate ca with radiation in 2005  . Hypertension   . Cellulitis of face feb 2011    prostetic rt eye, cellulitis of rt side of face  . CAD (coronary artery disease)     (non Q wave MI in Jan 2009. He had chronically ocluded right coronary artery. He had a LAD w/90% stenosis at the take off of the first diagona. He had a drug-eluding stent palced by Dr Burt Knack)  . Complete heart block     Zephyr dual chamber pacemaker per Dr Caryl Comes  . Dyslipidemia   . Hypothyroid   . Macular degeneration   . Blindness of right eye     Related to trauma in [place]  . Diabetes mellitus without complication    Past Surgical History  Procedure Laterality Date  . Eye removed Right 1997   History   Social History  . Marital Status: Married    Spouse Name: N/A    Number of Children: N/A  . Years of Education: N/A   Occupational History  . Retired     Manufacturing engineer business   Social History Main Topics  . Smoking status: Former Smoker -- 2.00 packs/day for 65 years    Types: Cigarettes    Quit date: 09/11/2000  . Smokeless tobacco: Never Used  . Alcohol Use: No  . Drug Use: No  . Sexual Activity: Not on file    Other Topics Concern  . Not on file   Social History Narrative   Married w/3 children, lives with wife.          No family history on file. Current Outpatient Prescriptions on File Prior to Visit  Medication Sig Dispense Refill  . aspirin 81 MG tablet Take 81 mg by mouth daily.        Marland Kitchen atorvastatin (LIPITOR) 20 MG tablet Take 1 tablet (20 mg total) by mouth at bedtime.  90 tablet  3  . carboxymethylcellulose (REFRESH PLUS) 0.5 % SOLN 1 drop 3 (three) times daily as needed.      . Chelated Potassium 99 MG TABS Take 1 tablet by mouth every other day.      . esomeprazole (NEXIUM) 40 MG capsule TAKE 1 CAPSULE DAILY  90 capsule  3  . Multiple Vitamins-Minerals (MULTIVITAMIN,TX-MINERALS) tablet Take 1 tablet by mouth daily.        . nitroGLYCERIN (NITROSTAT) 0.4 MG SL tablet Place 1 tablet (0.4 mg total) under the tongue as needed.  30 tablet  2   No current facility-administered medications on file prior to visit.   No Known Allergies  There is no immunization history on file for this patient. Prior to Admission medications   Medication Sig Start Date End Date Taking? Authorizing  Provider  ALPRAZolam Duanne Moron) 0.5 MG tablet Take 1 tablet (0.5 mg total) by mouth at bedtime as needed for sleep. 03/05/13  Yes Vernie Shanks, MD  aspirin 81 MG tablet Take 81 mg by mouth daily.     Yes Historical Provider, MD  atorvastatin (LIPITOR) 20 MG tablet Take 1 tablet (20 mg total) by mouth at bedtime. 02/19/13  Yes Vernie Shanks, MD  carboxymethylcellulose (REFRESH PLUS) 0.5 % SOLN 1 drop 3 (three) times daily as needed.   Yes Historical Provider, MD  Chelated Potassium 99 MG TABS Take 1 tablet by mouth every other day.   Yes Historical Provider, MD  clobetasol cream (TEMOVATE) [id number] % Apply 1 application topically 2 (two) times daily as needed. 03/05/13  Yes Vernie Shanks, MD  esomeprazole (NEXIUM) 40 MG capsule TAKE 1 CAPSULE DAILY 02/19/13  Yes Vernie Shanks, MD  fenofibrate 54 MG tablet TAKE 1  TABLET DAILY 03/05/13  Yes Vernie Shanks, MD  levothyroxine (SYNTHROID, LEVOTHROID) 150 MCG tablet Take 1 tablet (150 mcg total) by mouth daily before breakfast. 03/05/13  Yes Vernie Shanks, MD  metoprolol succinate (TOPROL-XL) 50 MG 24 hr tablet Take 1 tablet (50 mg total) by mouth daily. 03/05/13  Yes Vernie Shanks, MD  Multiple Vitamins-Minerals (MULTIVITAMIN,TX-MINERALS) tablet Take 1 tablet by mouth daily.     Yes Historical Provider, MD  nitroGLYCERIN (NITROSTAT) 0.4 MG SL tablet Place 1 tablet (0.4 mg total) under the tongue as needed. 02/19/13  Yes Vernie Shanks, MD  omega-3 acid ethyl esters (LOVAZA) 1 G capsule Take 2 capsules (2 g total) by mouth 2 (two) times daily. 03/05/13  Yes Vernie Shanks, MD     ROS: As above in the HPI. All other systems are stable or negative.  OBJECTIVE: APPEARANCE:  Patient in no acute distress.The patient appeared well nourished and normally developed. Acyanotic. Waist: VITAL SIGNS:BP 144/65  Pulse 65 Sit 126/59 P=59 Standing 144/65 P=65 Walking 174/75 P 64  WM obese  SKIN: warm and  Dry without overt rashes, tattoos and scars  HEAD and Neck: without JVD, Head and scalp: normal Eyes:No scleral icterus.,left  eye movement okay, right orbit empty. Ears: Auricle normal, canal normal, Tympanic membranes normal, insufflation normal. Nose: normal Throat: normal Neck & thyroid: normal  CHEST & LUNGS: Chest wall: normal Lungs: Clear  CVS: Reveals the PMI to be normally located. Regular rhythm, First and Second Heart sounds are normal,  absence of murmurs, rubs or gallops. Peripheral vasculature: Radial pulses: normal Dorsal pedis pulses: normal Posterior pulses: normal  ABDOMEN:  Appearance: obese Benign, no organomegaly, no masses, no Abdominal Aortic enlargement. No Guarding , no rebound. No Bruits. Bowel sounds: normal  RECTAL: N/A GU: N/A  EXTREMETIES: nonedematous.  MUSCULOSKELETAL:  Spine: normal  NEUROLOGIC: oriented  to time,place and person; nonfocal. Unsteady gait. Uses a cane to ambulate  Results for orders placed in visit on 03/05/13  POCT CBC      Result Value Range   WBC 4.3 (*) 4.6 - 10.2 K/uL   Lymph, poc 1.5  0.6 - 3.4   POC LYMPH PERCENT 33.8  10 - 50 %L   POC Granulocyte 2.5  2 - 6.9   Granulocyte percent 58.4  37 - 80 %G   RBC 4.4 (*) 4.69 - 6.13 M/uL   Hemoglobin 12.7 (*) 14.1 - 18.1 g/dL   HCT, POC 38.6 (*) 43.5 - 53.7 %   MCV 88.7  80 - 97 fL  MCH, POC 29.1  27 - 31.2 pg   MCHC 32.8  31.8 - 35.4 g/dL   RDW, POC 13.3     Platelet Count, POC 228.0  142 - 424 K/uL   MPV 8.2  0 - 99.8 fL    ASSESSMENT: Anxiety state, unspecified - Plan: ALPRAZolam (XANAX) 0.5 MG tablet  Atrial fibrillation-paroxysmal  AV BLOCK, COMPLETE  DM (diabetes mellitus)  Unspecified hypothyroidism - Plan: TSH, levothyroxine (SYNTHROID, LEVOTHROID) 150 MCG tablet  DYSLIPIDEMIA - Plan: omega-3 acid ethyl esters (LOVAZA) 1 G capsule, fenofibrate 54 MG tablet  CAD (coronary artery disease) of artery bypass graft - Plan: CMP14+EGFR, metoprolol succinate (TOPROL-XL) 50 MG 24 hr tablet  Dizziness and giddiness - Plan: POCT CBC, CMP14+EGFR, Vitamin B12, Folate   PLAN:  Orders Placed This Encounter  Procedures  . CMP14+EGFR  . TSH  . Vitamin B12  . Folate  . POCT CBC    Meds ordered this encounter  Medications  . DISCONTD: ALPRAZolam (XANAX) 0.5 MG tablet    Sig: Take 0.5 mg by mouth at bedtime as needed for sleep.  Marland Kitchen ALPRAZolam (XANAX) 0.5 MG tablet    Sig: Take 1 tablet (0.5 mg total) by mouth at bedtime as needed for sleep.    Dispense:  30 tablet    Refill:  1  . omega-3 acid ethyl esters (LOVAZA) 1 G capsule    Sig: Take 2 capsules (2 g total) by mouth 2 (two) times daily.    Dispense:  360 capsule    Refill:  3  . metoprolol succinate (TOPROL-XL) 50 MG 24 hr tablet    Sig: Take 1 tablet (50 mg total) by mouth daily.    Dispense:  90 tablet    Refill:  3  . levothyroxine  (SYNTHROID, LEVOTHROID) 150 MCG tablet    Sig: Take 1 tablet (150 mcg total) by mouth daily before breakfast.    Dispense:  90 tablet    Refill:  3  . clobetasol cream (TEMOVATE) 0.05 %    Sig: Apply 1 application topically 2 (two) times daily as needed.    Dispense:  60 g    Refill:  1  . fenofibrate 54 MG tablet    Sig: TAKE 1 TABLET DAILY    Dispense:  90 tablet    Refill:  3    Keep active.  Checked for orthostasis but that was negative for any drop in BP. Actually ambulating and his BP went up. Same meds.  Await labs.  Spoke with Bailey Mech who was present, dizziness a sequela of a small undetectable IC event in the cerebellum vs Beta blocker effect or other medication effect.  Return in about 2 months (around 05/05/2013) for Recheck medical problems.  Nillie Bartolotta P. Jacelyn Grip, M.D.

## 2013-03-06 ENCOUNTER — Other Ambulatory Visit: Payer: Self-pay | Admitting: Family Medicine

## 2013-03-06 DIAGNOSIS — E039 Hypothyroidism, unspecified: Secondary | ICD-10-CM

## 2013-03-06 LAB — CMP14+EGFR
ALT: 30 IU/L (ref 0–44)
AST: 41 IU/L — ABNORMAL HIGH (ref 0–40)
Albumin/Globulin Ratio: 2 (ref 1.1–2.5)
Albumin: 4.5 g/dL (ref 3.5–4.7)
Alkaline Phosphatase: 46 IU/L (ref 39–117)
BUN/Creatinine Ratio: 17 (ref 10–22)
BUN: 26 mg/dL (ref 8–27)
CO2: 27 mmol/L (ref 18–29)
Calcium: 9 mg/dL (ref 8.6–10.2)
Chloride: 100 mmol/L (ref 97–108)
Creatinine, Ser: 1.55 mg/dL — ABNORMAL HIGH (ref 0.76–1.27)
GFR calc Af Amer: 46 mL/min/{1.73_m2} — ABNORMAL LOW (ref >59)
GFR calc non Af Amer: 39 mL/min/{1.73_m2} — ABNORMAL LOW (ref >59)
Globulin, Total: 2.2 g/dL (ref 1.5–4.5)
Glucose: 98 mg/dL (ref 65–99)
Potassium: 4.7 mmol/L (ref 3.5–5.2)
Sodium: 141 mmol/L (ref 134–144)
Total Bilirubin: 0.4 mg/dL (ref 0.0–1.2)
Total Protein: 6.7 g/dL (ref 6.0–8.5)

## 2013-03-06 LAB — TSH: TSH: 8.79 u[IU]/mL — ABNORMAL HIGH (ref 0.450–4.500)

## 2013-03-06 LAB — FOLATE: Folate: >19.9 ng/mL (ref >3.0)

## 2013-03-06 LAB — VITAMIN B12: Vitamin B-12: 404 pg/mL (ref 211–946)

## 2013-03-06 MED ORDER — LEVOTHYROXINE SODIUM 175 MCG PO TABS
175.0000 ug | ORAL_TABLET | Freq: Every day | ORAL | Status: DC
Start: 2013-03-06 — End: 2013-10-04

## 2013-03-06 NOTE — Progress Notes (Signed)
Quick Note:  Labs abnormal. still hypothyroid. Will need to increase the thyroid doses. Will order in EPIC. The rest was stable or good. Recheck in 2 months. ______

## 2013-03-10 ENCOUNTER — Telehealth: Payer: Self-pay | Admitting: Family Medicine

## 2013-03-12 NOTE — Telephone Encounter (Signed)
Per daughter "he is worse" with dizziness  Concerned if he has inner ear

## 2013-03-13 ENCOUNTER — Other Ambulatory Visit: Payer: Self-pay | Admitting: Family Medicine

## 2013-03-13 MED ORDER — SCOPOLAMINE 1 MG/3DAYS TD PT72: 1.0000 | MEDICATED_PATCH | TRANSDERMAL | Status: DC

## 2013-03-13 NOTE — Telephone Encounter (Signed)
Tell Bailey Mech we will call in transderm scop to put behind ear and see.

## 2013-03-16 ENCOUNTER — Telehealth: Payer: Self-pay | Admitting: Family Medicine

## 2013-03-16 NOTE — Telephone Encounter (Signed)
Robyn aware and requested she call if the patches does not helpl

## 2013-03-20 NOTE — Telephone Encounter (Signed)
PT SAID HE FOUND RX.

## 2013-03-24 ENCOUNTER — Telehealth: Payer: Self-pay

## 2013-03-24 NOTE — Telephone Encounter (Signed)
They where printed and he was informed to pick them up

## 2013-03-24 NOTE — Telephone Encounter (Signed)
Pt would like for you to write rx for a new battery for his wheelchair. Will pick up on 03-25-13

## 2013-03-25 ENCOUNTER — Encounter (HOSPITAL_COMMUNITY): Payer: Self-pay | Admitting: Emergency Medicine

## 2013-03-25 ENCOUNTER — Emergency Department (HOSPITAL_COMMUNITY)
Admission: EM | Admit: 2013-03-25 | Discharge: 2013-03-25 | Disposition: A | Discharge status: Home / Self Care | Payer: Medicare Other | Attending: Emergency Medicine | Admitting: Emergency Medicine

## 2013-03-25 DIAGNOSIS — H5440 Blindness, one eye, unspecified eye: Secondary | ICD-10-CM | POA: Diagnosis not present

## 2013-03-25 DIAGNOSIS — I2510 Atherosclerotic heart disease of native coronary artery without angina pectoris: Secondary | ICD-10-CM | POA: Insufficient documentation

## 2013-03-25 DIAGNOSIS — I10 Essential (primary) hypertension: Secondary | ICD-10-CM | POA: Diagnosis not present

## 2013-03-25 DIAGNOSIS — E039 Hypothyroidism, unspecified: Secondary | ICD-10-CM | POA: Insufficient documentation

## 2013-03-25 DIAGNOSIS — E119 Type 2 diabetes mellitus without complications: Secondary | ICD-10-CM | POA: Diagnosis not present

## 2013-03-25 DIAGNOSIS — R110 Nausea: Secondary | ICD-10-CM | POA: Insufficient documentation

## 2013-03-25 DIAGNOSIS — E785 Hyperlipidemia, unspecified: Secondary | ICD-10-CM | POA: Diagnosis not present

## 2013-03-25 DIAGNOSIS — Z8546 Personal history of malignant neoplasm of prostate: Secondary | ICD-10-CM | POA: Diagnosis not present

## 2013-03-25 DIAGNOSIS — R42 Dizziness and giddiness: Secondary | ICD-10-CM | POA: Diagnosis not present

## 2013-03-25 DIAGNOSIS — Z79899 Other long term (current) drug therapy: Secondary | ICD-10-CM | POA: Diagnosis not present

## 2013-03-25 DIAGNOSIS — R5381 Other malaise: Secondary | ICD-10-CM | POA: Diagnosis not present

## 2013-03-25 DIAGNOSIS — Z87891 Personal history of nicotine dependence: Secondary | ICD-10-CM | POA: Insufficient documentation

## 2013-03-25 DIAGNOSIS — Z7982 Long term (current) use of aspirin: Secondary | ICD-10-CM | POA: Diagnosis not present

## 2013-03-25 DIAGNOSIS — Z923 Personal history of irradiation: Secondary | ICD-10-CM | POA: Diagnosis not present

## 2013-03-25 DIAGNOSIS — Z872 Personal history of diseases of the skin and subcutaneous tissue: Secondary | ICD-10-CM | POA: Insufficient documentation

## 2013-03-25 LAB — URINALYSIS, ROUTINE W REFLEX MICROSCOPIC
Bilirubin Urine: NEGATIVE
Glucose, UA: NEGATIVE mg/dL
Hgb urine dipstick: NEGATIVE
Nitrite: NEGATIVE
Protein, ur: NEGATIVE mg/dL
Specific Gravity, Urine: 1.02 (ref 1.005–1.030)
pH: 6 (ref 5.0–8.0)

## 2013-03-25 LAB — CBC WITH DIFFERENTIAL/PLATELET
Basophils Relative: 1 % (ref 0–1)
Eosinophils Absolute: 0.2 10*3/uL (ref 0.0–0.7)
MCH: 30 pg (ref 26.0–34.0)
MCHC: 33.2 g/dL (ref 30.0–36.0)
Neutrophils Relative %: 64 % (ref 43–77)
Platelets: 168 10*3/uL (ref 150–400)
RBC: 4.24 MIL/uL (ref 4.22–5.81)

## 2013-03-25 LAB — COMPREHENSIVE METABOLIC PANEL
ALT: 28 U/L (ref 0–53)
AST: 33 U/L (ref 0–37)
Albumin: 3.5 g/dL (ref 3.5–5.2)
Alkaline Phosphatase: 52 U/L (ref 39–117)
Potassium: 4.2 mEq/L (ref 3.5–5.1)
Sodium: 136 mEq/L (ref 135–145)
Total Protein: 7.2 g/dL (ref 6.0–8.3)

## 2013-03-25 MED ORDER — SODIUM CHLORIDE 0.9 % IV BOLUS (SEPSIS)
500.0000 mL | Freq: Once | INTRAVENOUS | Status: AC
  Administered 2013-03-25: 500 mL via INTRAVENOUS

## 2013-03-25 NOTE — ED Notes (Signed)
Pt alert and oriented x4. Respirations even and unlabored, bilateral symmetrical rise and fall of chest. Skin warm and dry. In no acute distress. Denies needs.   

## 2013-03-25 NOTE — ED Notes (Signed)
Pt escorted to discharge window. Pt verbalized understanding discharge instructions. In no acute distress.  

## 2013-03-25 NOTE — ED Provider Notes (Addendum)
CSN: [id number]     Arrival date & time 03/25/13  1131 History   First MD Initiated Contact with Patient 03/25/13 1149     Chief Complaint  Patient presents with  . Dizziness  . Nausea   (Consider location/radiation/quality/duration/timing/severity/associated sxs/prior Treatment) HPI Comments: Patient presents to the ER for evaluation of weakness and dizziness. He has been experiencing these symptoms for several months. He has seen his doctor and had a CAT scan performed twice for these symptoms. He says he has seen five different doctors and each of them has told him that he "might have had a small stroke". He cannot get an MRI because he has a pacemaker.  Patient reports waking up this morning more dizzy than usual. He denies spinning and vertigo-type symptoms. He did have nausea associated with the symptoms, but the nausea has resolved. There was no vomiting. He denies chest pain, shortness of breath.   Past Medical History  Diagnosis Date  . Cancer     prostate ca with radiation in 2005  . Hypertension   . Cellulitis of face feb 2011    prostetic rt eye, cellulitis of rt side of face  . CAD (coronary artery disease)     (non Q wave MI in Jan 2009. He had chronically ocluded right coronary artery. He had a LAD w/90% stenosis at the take off of the first diagona. He had a drug-eluding stent palced by Dr Burt Knack)  . Complete heart block     Zephyr dual chamber pacemaker per Dr Caryl Comes  . Dyslipidemia   . Hypothyroid   . Macular degeneration   . Blindness of right eye     Related to trauma in [place]  . Diabetes mellitus without complication    Past Surgical History  Procedure Laterality Date  . Eye removed Right 1997   No family history on file. History  Substance Use Topics  . Smoking status: Former Smoker -- 2.00 packs/day for 65 years    Types: Cigarettes    Quit date: 09/11/2000  . Smokeless tobacco: Never Used  . Alcohol Use: No    Review of Systems  Constitutional:  Negative for fever.  Respiratory: Negative for shortness of breath.   Cardiovascular: Negative for chest pain.  Neurological: Positive for dizziness. Negative for headaches.  All other systems reviewed and are negative.    Allergies  Review of patient's allergies indicates no known allergies.  Home Medications   Current Outpatient Rx  Name  Route  Sig  Dispense  Refill  . ALPRAZolam (XANAX) 0.5 MG tablet   Oral   Take 1 tablet (0.5 mg total) by mouth at bedtime as needed for sleep.   30 tablet   1   . aspirin EC 81 MG tablet   Oral   Take 81 mg by mouth daily.         Marland Kitchen atorvastatin (LIPITOR) 20 MG tablet   Oral   Take 1 tablet (20 mg total) by mouth at bedtime.   90 tablet   3   . carboxymethylcellulose (REFRESH PLUS) 0.5 % SOLN      1 drop 3 (three) times daily as needed (for dry eyes).          . Chelated Potassium 99 MG TABS   Oral   Take 1 tablet by mouth every other day.         . Chelated Zinc 50 MG TABS   Oral   Take 50 mg by mouth every other  day.         . clobetasol (TEMOVATE) 0.05 % external solution   Topical   Apply 1 application topically 2 (two) times daily.         . clobetasol cream (TEMOVATE) 0.05 %   Topical   Apply 1 application topically 2 (two) times daily as needed.   60 g   1   . clopidogrel (PLAVIX) 75 MG tablet   Oral   Take 75 mg by mouth every morning.         Marland Kitchen esomeprazole (NEXIUM) 40 MG capsule      TAKE 1 CAPSULE DAILY   90 capsule   3   . esomeprazole (NEXIUM) 40 MG capsule   Oral   Take 40 mg by mouth daily before breakfast.         . fenofibrate 54 MG tablet   Oral   Take 54 mg by mouth daily.         Marland Kitchen levothyroxine (SYNTHROID, LEVOTHROID) 175 MCG tablet   Oral   Take 1 tablet (175 mcg total) by mouth daily before breakfast.   90 tablet   3   . metoprolol succinate (TOPROL-XL) 50 MG 24 hr tablet   Oral   Take 50 mg by mouth at bedtime. Take with or immediately following a meal.          . Multiple Vitamins-Minerals (MULTIVITAMIN,TX-MINERALS) tablet   Oral   Take 1 tablet by mouth daily.           . nitroGLYCERIN (NITROSTAT) 0.4 MG SL tablet   Sublingual   Place 1 tablet (0.4 mg total) under the tongue as needed.   30 tablet   2   . omega-3 acid ethyl esters (LOVAZA) 1 G capsule   Oral   Take 2 capsules (2 g total) by mouth 2 (two) times daily.   360 capsule   3   . OVER THE COUNTER MEDICATION   Apply externally   Apply 1 application topically daily as needed (for scalp).          BP 131/57  Pulse 61  Temp(Src) 98.7 F (37.1 C) (Oral)  Resp 16  SpO2 98% Physical Exam  Constitutional: He is oriented to person, place, and time. He appears well-developed and well-nourished. No distress.  HENT:  Head: Normocephalic and atraumatic.  Right Ear: Hearing normal.  Left Ear: Hearing normal.  Nose: Nose normal.  Mouth/Throat: Oropharynx is clear and moist and mucous membranes are normal.  Eyes: Conjunctivae and EOM are normal. Pupils are equal, round, and reactive to light.  Neck: Normal range of motion. Neck supple.  Cardiovascular: Regular rhythm, S1 normal and S2 normal.  Exam reveals no gallop and no friction rub.   No murmur heard. Pulmonary/Chest: Effort normal and breath sounds normal. No respiratory distress. He exhibits no tenderness.  Abdominal: Soft. Normal appearance and bowel sounds are normal. There is no hepatosplenomegaly. There is no tenderness. There is no rebound, no guarding, no tenderness at McBurney's point and negative Murphy's sign. No hernia.  Musculoskeletal: Normal range of motion.  Neurological: He is alert and oriented to person, place, and time. He has normal strength. No cranial nerve deficit or sensory deficit. Coordination normal. GCS eye subscore is 4. GCS verbal subscore is 5. GCS motor subscore is 6.  Skin: Skin is warm, dry and intact. No rash noted. No cyanosis.  Psychiatric: He has a normal mood and affect. His speech  is normal and behavior is  normal. Thought content normal.    ED Course  Procedures (including critical care time)   Date: 03/25/2013  Rate: 61  Rhythm: A-V pacing, no further analysis    Labs Review Labs Reviewed  CBC WITH DIFFERENTIAL - Abnormal; Notable for the following:    Hemoglobin 12.7 (*)    HCT 38.3 (*)    Monocytes Relative 13 (*)    All other components within normal limits  COMPREHENSIVE METABOLIC PANEL - Abnormal; Notable for the following:    Glucose, Bld 118 (*)    BUN 26 (*)    Creatinine, Ser 1.61 (*)    GFR calc non Af Amer 37 (*)    GFR calc Af Amer 42 (*)    All other components within normal limits  TROPONIN I  URINALYSIS, ROUTINE W REFLEX MICROSCOPIC  LIPASE, BLOOD   Imaging Review No results found.  EKG Interpretation     Ventricular Rate:  61 PR Interval:  65 QRS Duration: 158 QT Interval:  491 QTC Calculation: 491 R Axis:   -41 Text Interpretation:  A-V dual-paced rhythm with some inhibition No further analysis attempted due to paced rhythm ED PHYSICIAN INTERPRETATION AVAILABLE IN [facility] [place]            MDM  Diagnosis: Dizziness  Presents to the ER for evaluation of dizziness. Patient has normal neurologic examination on arrival. Workup was unremarkable. Symptoms have been ongoing for several months. He has been seen by multiple doctors for this. As this has been ongoing for some time and he has had a thorough workup including recent carotid ultrasound, it is felt reasonable to restart the patient on Plavix and followup with primary doctor as an outpatient.    Orpah Greek, MD 04/08/13 [place], MD 04/10/13 [phone]

## 2013-03-25 NOTE — ED Notes (Addendum)
Pt reports dizziness, lightheadedness, nausea, weakness, and shortness of breath for the past two months. Pt reports having a CT exam after the presentation of his symptoms, however their were unsignificant findings. Pt reports that his PCP has attempted to change his tyroid medication and gave medication for vertigo, however pt reports continued symptoms without relief. Pt denies chest pain and is talking in full sentences without difficulty.

## 2013-03-27 NOTE — Telephone Encounter (Signed)
Rx written for battery

## 2013-03-27 NOTE — Telephone Encounter (Signed)
Pt notified and rx for wheelchair battery at front desk.

## 2013-04-03 ENCOUNTER — Telehealth: Payer: Self-pay | Admitting: Family Medicine

## 2013-04-09 ENCOUNTER — Other Ambulatory Visit: Payer: Self-pay | Admitting: Family Medicine

## 2013-04-09 DIAGNOSIS — I70209 Unspecified atherosclerosis of native arteries of extremities, unspecified extremity: Secondary | ICD-10-CM | POA: Diagnosis not present

## 2013-04-09 DIAGNOSIS — I69398 Other sequelae of cerebral infarction: Secondary | ICD-10-CM

## 2013-04-09 DIAGNOSIS — L851 Acquired keratosis [keratoderma] palmaris et plantaris: Secondary | ICD-10-CM | POA: Diagnosis not present

## 2013-04-09 DIAGNOSIS — R42 Dizziness and giddiness: Secondary | ICD-10-CM

## 2013-04-09 DIAGNOSIS — B351 Tinea unguium: Secondary | ICD-10-CM | POA: Diagnosis not present

## 2013-04-09 MED ORDER — SCOPOLAMINE 1 MG/3DAYS TD PT72: 1.0000 | MEDICATED_PATCH | TRANSDERMAL | Status: DC

## 2013-04-09 NOTE — Telephone Encounter (Signed)
Call patient : Prescription refilled & sent to pharmacy in [place]. Patient aware

## 2013-04-10 NOTE — Telephone Encounter (Signed)
Pt aware.

## 2013-05-12 ENCOUNTER — Other Ambulatory Visit: Payer: Medicare Other

## 2013-05-12 DIAGNOSIS — R351 Nocturia: Secondary | ICD-10-CM | POA: Diagnosis not present

## 2013-05-12 DIAGNOSIS — R3915 Urgency of urination: Secondary | ICD-10-CM | POA: Diagnosis not present

## 2013-05-12 DIAGNOSIS — Z8546 Personal history of malignant neoplasm of prostate: Secondary | ICD-10-CM | POA: Diagnosis not present

## 2013-05-14 ENCOUNTER — Telehealth: Payer: Self-pay | Admitting: *Deleted

## 2013-05-14 ENCOUNTER — Encounter: Payer: Self-pay | Admitting: Internal Medicine

## 2013-05-14 ENCOUNTER — Other Ambulatory Visit: Payer: Self-pay | Admitting: Family Medicine

## 2013-05-14 DIAGNOSIS — I442 Atrioventricular block, complete: Secondary | ICD-10-CM | POA: Diagnosis not present

## 2013-05-14 MED ORDER — PRAVASTATIN SODIUM 20 MG PO TABS: 20.0000 mg | ORAL_TABLET | Freq: Every day | ORAL | Status: DC

## 2013-05-14 NOTE — Telephone Encounter (Signed)
[facility] sent fax stating that atorvastatin is temporarily unavailable in all strengths. Wants an alternative ordered. Please advise

## 2013-05-14 NOTE — Telephone Encounter (Signed)
Pt notified of change in rx

## 2013-05-14 NOTE — Telephone Encounter (Signed)
Rx changed to pravastatin. [facility].

## 2013-05-28 ENCOUNTER — Telehealth: Payer: Self-pay | Admitting: Family Medicine

## 2013-05-28 NOTE — Telephone Encounter (Signed)
Pt aware, atorvastatin out and pravochol was replacement.  Fenofibrate helps decreases tryglycerides.

## 2013-06-01 ENCOUNTER — Telehealth: Payer: Self-pay | Admitting: Family Medicine

## 2013-06-01 NOTE — Telephone Encounter (Signed)
Appt given for tomorrow per daughters request

## 2013-06-02 ENCOUNTER — Ambulatory Visit (INDEPENDENT_AMBULATORY_CARE_PROVIDER_SITE_OTHER): Payer: Medicare Other | Admitting: Family Medicine

## 2013-06-02 ENCOUNTER — Ambulatory Visit (INDEPENDENT_AMBULATORY_CARE_PROVIDER_SITE_OTHER): Payer: Medicare Other

## 2013-06-02 ENCOUNTER — Encounter: Payer: Self-pay | Admitting: Family Medicine

## 2013-06-02 VITALS — BP 146/65 | HR 81 | Temp 100.1°F | Ht 72.0 in | Wt 225.4 lb

## 2013-06-02 DIAGNOSIS — F411 Generalized anxiety disorder: Secondary | ICD-10-CM | POA: Diagnosis not present

## 2013-06-02 DIAGNOSIS — R12 Heartburn: Secondary | ICD-10-CM | POA: Insufficient documentation

## 2013-06-02 DIAGNOSIS — R05 Cough: Secondary | ICD-10-CM | POA: Insufficient documentation

## 2013-06-02 DIAGNOSIS — J189 Pneumonia, unspecified organism: Secondary | ICD-10-CM | POA: Diagnosis not present

## 2013-06-02 DIAGNOSIS — R059 Cough, unspecified: Secondary | ICD-10-CM

## 2013-06-02 LAB — POCT INFLUENZA A/B
Influenza A, POC: NEGATIVE
Influenza B, POC: NEGATIVE

## 2013-06-02 MED ORDER — ALPRAZOLAM 0.5 MG PO TABS: 0.5000 mg | ORAL_TABLET | Freq: Every evening | ORAL | Status: DC | PRN

## 2013-06-02 MED ORDER — LEVOFLOXACIN 500 MG PO TABS: 500.0000 mg | ORAL_TABLET | Freq: Every day | ORAL | Status: DC

## 2013-06-02 NOTE — Progress Notes (Signed)
Patient ID: [name], male   DOB: [birth year], 79 y.o.   MRN: [id number] SUBJECTIVE: CC: Chief Complaint  Patient presents with  . Acute Visit    cough    HPI:   As above Has been coughing for a couple of weeks and worse today. No fever noticed at home but his temp is higher here. No hemoptysis. Has a lot of gas and heart burn. No angina. No wheezing , no SOB Head congested. Past Medical History  Diagnosis Date  . Cancer     prostate ca with radiation in 2005  . Hypertension   . Cellulitis of face feb 2011    prostetic rt eye, cellulitis of rt side of face  . CAD (coronary artery disease)     (non Q wave MI in Jan 2009. He had chronically ocluded right coronary artery. He had a LAD w/90% stenosis at the take off of the first diagona. He had a drug-eluding stent palced by Dr Burt Knack)  . Complete heart block     Zephyr dual chamber pacemaker per Dr Caryl Comes  . Dyslipidemia   . Hypothyroid   . Macular degeneration   . Blindness of right eye     Related to trauma in [place]  . Diabetes mellitus without complication    Past Surgical History  Procedure Laterality Date  . Eye removed Right 1997   History   Social History  . Marital Status: Married    Spouse Name: N/A    Number of Children: N/A  . Years of Education: N/A   Occupational History  . Retired     Manufacturing engineer business   Social History Main Topics  . Smoking status: Former Smoker -- 2.00 packs/day for 65 years    Types: Cigarettes    Quit date: 09/11/2000  . Smokeless tobacco: Never Used  . Alcohol Use: No  . Drug Use: No  . Sexual Activity: Not on file   Other Topics Concern  . Not on file   Social History Narrative   Married w/3 children, lives with wife.          No family history on file. Current Outpatient Prescriptions on File Prior to Visit  Medication Sig Dispense Refill  . aspirin EC 81 MG tablet Take 81 mg by mouth daily.      . carboxymethylcellulose (REFRESH PLUS) 0.5 % SOLN 1 drop 3  (three) times daily as needed (for dry eyes).       . Chelated Potassium 99 MG TABS Take 1 tablet by mouth every other day.      . Chelated Zinc 50 MG TABS Take 50 mg by mouth every other day.      . clobetasol (TEMOVATE) 0.05 % external solution Apply 1 application topically 2 (two) times daily.      . clobetasol cream (TEMOVATE) AB-123456789 % Apply 1 application topically 2 (two) times daily as needed.  60 g  1  . clopidogrel (PLAVIX) 75 MG tablet Take 75 mg by mouth every morning.      Marland Kitchen esomeprazole (NEXIUM) 40 MG capsule TAKE 1 CAPSULE DAILY  90 capsule  3  . esomeprazole (NEXIUM) 40 MG capsule Take 40 mg by mouth daily before breakfast.      . fenofibrate 54 MG tablet Take 54 mg by mouth daily.      Marland Kitchen levothyroxine (SYNTHROID, LEVOTHROID) 175 MCG tablet Take 1 tablet (175 mcg total) by mouth daily before breakfast.  90 tablet  3  . metoprolol  succinate (TOPROL-XL) 50 MG 24 hr tablet Take 50 mg by mouth at bedtime. Take with or immediately following a meal.      . Multiple Vitamins-Minerals (MULTIVITAMIN,TX-MINERALS) tablet Take 1 tablet by mouth daily.        . nitroGLYCERIN (NITROSTAT) 0.4 MG SL tablet Place 1 tablet (0.4 mg total) under the tongue as needed.  30 tablet  2  . omega-3 acid ethyl esters (LOVAZA) 1 G capsule Take 2 capsules (2 g total) by mouth 2 (two) times daily.  360 capsule  3  . OVER THE COUNTER MEDICATION Apply 1 application topically daily as needed (for scalp).      . pravastatin (PRAVACHOL) 20 MG tablet Take 1 tablet (20 mg total) by mouth daily.  90 tablet  3  . scopolamine (TRANSDERM-SCOP) 1.5 MG Place 1 patch (1.5 mg total) onto the skin every 3 (three) days.  10 patch  2   No current facility-administered medications on file prior to visit.   No Known Allergies  There is no immunization history on file for this patient. Prior to Admission medications   Medication Sig Start Date End Date Taking? Authorizing Provider  ALPRAZolam Duanne Moron) 0.5 MG tablet Take 1 tablet  (0.5 mg total) by mouth at bedtime as needed for sleep. 03/05/13  Yes Vernie Shanks, MD  aspirin EC 81 MG tablet Take 81 mg by mouth daily.   Yes Historical Provider, MD  carboxymethylcellulose (REFRESH PLUS) 0.5 % SOLN 1 drop 3 (three) times daily as needed (for dry eyes).    Yes Historical Provider, MD  Chelated Potassium 99 MG TABS Take 1 tablet by mouth every other day.   Yes Historical Provider, MD  Chelated Zinc 50 MG TABS Take 50 mg by mouth every other day.   Yes Historical Provider, MD  clobetasol (TEMOVATE) 0.05 % external solution Apply 1 application topically 2 (two) times daily.   Yes Historical Provider, MD  clobetasol cream (TEMOVATE) AB-123456789 % Apply 1 application topically 2 (two) times daily as needed. 03/05/13  Yes Vernie Shanks, MD  clopidogrel (PLAVIX) 75 MG tablet Take 75 mg by mouth every morning.   Yes Historical Provider, MD  esomeprazole (NEXIUM) 40 MG capsule TAKE 1 CAPSULE DAILY 02/19/13  Yes Vernie Shanks, MD  esomeprazole (NEXIUM) 40 MG capsule Take 40 mg by mouth daily before breakfast.   Yes Historical Provider, MD  fenofibrate 54 MG tablet Take 54 mg by mouth daily.   Yes Historical Provider, MD  levothyroxine (SYNTHROID, LEVOTHROID) 175 MCG tablet Take 1 tablet (175 mcg total) by mouth daily before breakfast. 03/06/13  Yes Vernie Shanks, MD  metoprolol succinate (TOPROL-XL) 50 MG 24 hr tablet Take 50 mg by mouth at bedtime. Take with or immediately following a meal.   Yes Historical Provider, MD  Multiple Vitamins-Minerals (MULTIVITAMIN,TX-MINERALS) tablet Take 1 tablet by mouth daily.     Yes Historical Provider, MD  nitroGLYCERIN (NITROSTAT) 0.4 MG SL tablet Place 1 tablet (0.4 mg total) under the tongue as needed. 02/19/13  Yes Vernie Shanks, MD  omega-3 acid ethyl esters (LOVAZA) 1 G capsule Take 2 capsules (2 g total) by mouth 2 (two) times daily. 03/05/13  Yes Vernie Shanks, MD  OVER THE COUNTER MEDICATION Apply 1 application topically daily as needed (for scalp).    Yes Historical Provider, MD  pravastatin (PRAVACHOL) 20 MG tablet Take 1 tablet (20 mg total) by mouth daily. 05/14/13  Yes Vernie Shanks, MD  scopolamine (TRANSDERM-SCOP)  1.5 MG Place 1 patch (1.5 mg total) onto the skin every 3 (three) days. 04/09/13  Yes Vernie Shanks, MD     ROS: As above in the HPI. All other systems are stable or negative.  OBJECTIVE: APPEARANCE:  Patient in no acute distress.The patient appeared well nourished and normally developed. Acyanotic. Waist: VITAL SIGNS:BP 146/65  Pulse 81  Temp(Src) 100.1 F (37.8 C) (Oral)  Ht 6' (1.829 m)  Wt 225 lb 6.4 oz (102.241 kg)  BMI 30.56 kg/m2  SpO2 97% Obese WM  SKIN: warm and  Dry without overt rashes, tattoos and scars  HEAD and Neck: without JVD, Head and scalp: normal Eyes:No scleral icterus. Fundi normal, eye movements normal.anucleated right eye. Ears: Auricle normal, canal normal, Tympanic membranes normal, insufflation normal. Nose: rhinorrhea Throat: normal Neck & thyroid: normal  CHEST & LUNGS: Chest wall: normal Lungs: Rales LLL  CVS: Reveals the PMI to be normally located. Regular rhythm, First and Second Heart sounds are normal,  absence of murmurs, rubs or gallops. Peripheral vasculature: Radial pulses: normal Dorsal pedis pulses: normal Posterior pulses: normal  ABDOMEN:  Appearance: normal Benign, no organomegaly, no masses, no Abdominal Aortic enlargement. No Guarding , no rebound. No Bruits. Bowel sounds: normal  RECTAL: N/A GU: N/A  EXTREMETIES: nonedematous.   NEUROLOGIC: oriented to time,place and person; nonfocal.  ASSESSMENT: Cough - Plan: DG Chest 2 View, Influenza A/B  CAP (community acquired pneumonia) - Plan: levofloxacin (LEVAQUIN) 500 MG tablet  Heart burn - Plan: EKG 12-Lead  Anxiety state, unspecified - Plan: ALPRAZolam (XANAX) 0.5 MG tablet  PLAN:  Orders Placed This Encounter  Procedures  . DG Chest 2 View    Standing Status: Future     Number  of Occurrences: 1     Standing Expiration Date: 08/03/2014    Order Specific Question:  Reason for Exam (SYMPTOM  OR DIAGNOSIS REQUIRED)    Answer:  cough    Order Specific Question:  Preferred imaging location?    Answer:  Internal  . Influenza A/B  . EKG 12-Lead  EKG: no acute changes  [facility] reading (PRIMARY) by  Dr. Jacelyn Grip: infiltrate in the LLL. Fluids rest. To the ED if acutely worse.                                   Meds ordered this encounter  Medications  . levofloxacin (LEVAQUIN) 500 MG tablet    Sig: Take 1 tablet (500 mg total) by mouth daily.    Dispense:  10 tablet    Refill:  0  . ALPRAZolam (XANAX) 0.5 MG tablet    Sig: Take 1 tablet (0.5 mg total) by mouth at bedtime as needed for sleep.    Dispense:  90 tablet    Refill:  0   Medications Discontinued During This Encounter  Medication Reason  . ALPRAZolam (XANAX) 0.5 MG tablet Reorder   Return in about 3 days (around 06/05/2013) for recheck pneumonia.  Shivam Mestas P. Jacelyn Grip, M.D.

## 2013-06-05 ENCOUNTER — Encounter: Payer: Self-pay | Admitting: Nurse Practitioner

## 2013-06-05 ENCOUNTER — Ambulatory Visit (INDEPENDENT_AMBULATORY_CARE_PROVIDER_SITE_OTHER): Payer: Medicare Other | Admitting: Nurse Practitioner

## 2013-06-05 VITALS — BP 138/71 | HR 72 | Temp 97.2°F | Ht 72.0 in | Wt 228.0 lb

## 2013-06-05 DIAGNOSIS — J209 Acute bronchitis, unspecified: Secondary | ICD-10-CM | POA: Diagnosis not present

## 2013-06-05 MED ORDER — BENZONATATE 200 MG PO CAPS: 200.0000 mg | ORAL_CAPSULE | Freq: Two times a day (BID) | ORAL | Status: DC | PRN

## 2013-06-05 NOTE — Patient Instructions (Signed)

## 2013-06-05 NOTE — Progress Notes (Signed)
   Subjective:    Patient ID: [name], male    DOB: Oct 19, 1921, 79 y.o.   MRN: [id number]  HPI Patient was seen by Dr. Jacelyn Grip on Wednesday and was diagnosed with pneumonia- was given antibiotic and is here for recheck- Radiology report showed no infiltrate- feels somewhat better- still coughing at night.    Review of Systems  Constitutional: Negative.   HENT: Negative.   Respiratory: Positive for cough.   Cardiovascular: Negative.   Gastrointestinal: Negative.        Objective:   Physical Exam  Constitutional: He is oriented to person, place, and time. He appears well-developed and well-nourished.  HENT:  Right Ear: External ear normal.  Left Ear: External ear normal.  Nose: Nose normal.  Mouth/Throat: Oropharynx is clear and moist.  Eyes: Pupils are equal, round, and reactive to light.  Cardiovascular: Normal rate, regular rhythm and normal heart sounds.   Pulmonary/Chest: Effort normal and breath sounds normal.  Dry cough  Neurological: He is alert and oriented to person, place, and time.  Skin: Skin is warm.  Psychiatric: He has a normal mood and affect. His behavior is normal. Judgment and thought content normal.   BP 138/71  Pulse 72  Temp(Src) 97.2 F (36.2 C) (Oral)  Ht 6' (1.829 m)  Wt 228 lb (103.42 kg)  BMI 30.92 kg/m2        Assessment & Plan:   1. Acute bronchitis    Meds ordered this encounter  Medications  . benzonatate (TESSALON) 200 MG capsule    Sig: Take 1 capsule (200 mg total) by mouth 2 (two) times daily as needed for cough.    Dispense:  20 capsule    Refill:  0    Order Specific Question:  Supervising Provider    Answer:  Joycelyn Man   Finish antibiotic Force fluids Follow up prn  Mary-Margaret Hassell Done, FNP

## 2013-08-11 DIAGNOSIS — I70209 Unspecified atherosclerosis of native arteries of extremities, unspecified extremity: Secondary | ICD-10-CM | POA: Diagnosis not present

## 2013-08-11 DIAGNOSIS — L851 Acquired keratosis [keratoderma] palmaris et plantaris: Secondary | ICD-10-CM | POA: Diagnosis not present

## 2013-08-11 DIAGNOSIS — B351 Tinea unguium: Secondary | ICD-10-CM | POA: Diagnosis not present

## 2013-08-24 ENCOUNTER — Encounter: Payer: Self-pay | Admitting: Internal Medicine

## 2013-08-24 DIAGNOSIS — I442 Atrioventricular block, complete: Secondary | ICD-10-CM | POA: Diagnosis not present

## 2013-08-24 DIAGNOSIS — Z950 Presence of cardiac pacemaker: Secondary | ICD-10-CM | POA: Diagnosis not present

## 2013-09-01 ENCOUNTER — Other Ambulatory Visit: Payer: Self-pay | Admitting: Family Medicine

## 2013-09-13 DIAGNOSIS — L03039 Cellulitis of unspecified toe: Secondary | ICD-10-CM | POA: Diagnosis not present

## 2013-09-13 DIAGNOSIS — L02619 Cutaneous abscess of unspecified foot: Secondary | ICD-10-CM | POA: Diagnosis not present

## 2013-10-02 ENCOUNTER — Ambulatory Visit (INDEPENDENT_AMBULATORY_CARE_PROVIDER_SITE_OTHER): Payer: Medicare Other | Admitting: Family Medicine

## 2013-10-02 ENCOUNTER — Encounter: Payer: Self-pay | Admitting: Family Medicine

## 2013-10-02 VITALS — BP 155/61 | HR 60 | Temp 97.3°F | Ht 72.0 in | Wt 223.4 lb

## 2013-10-02 DIAGNOSIS — E785 Hyperlipidemia, unspecified: Secondary | ICD-10-CM | POA: Diagnosis not present

## 2013-10-02 DIAGNOSIS — F32A Depression, unspecified: Secondary | ICD-10-CM | POA: Insufficient documentation

## 2013-10-02 DIAGNOSIS — E119 Type 2 diabetes mellitus without complications: Secondary | ICD-10-CM | POA: Diagnosis not present

## 2013-10-02 DIAGNOSIS — I2510 Atherosclerotic heart disease of native coronary artery without angina pectoris: Secondary | ICD-10-CM | POA: Diagnosis not present

## 2013-10-02 DIAGNOSIS — F329 Major depressive disorder, single episode, unspecified: Secondary | ICD-10-CM

## 2013-10-02 DIAGNOSIS — I25810 Atherosclerosis of coronary artery bypass graft(s) without angina pectoris: Secondary | ICD-10-CM

## 2013-10-02 DIAGNOSIS — Z950 Presence of cardiac pacemaker: Secondary | ICD-10-CM | POA: Diagnosis not present

## 2013-10-02 DIAGNOSIS — F411 Generalized anxiety disorder: Secondary | ICD-10-CM

## 2013-10-02 DIAGNOSIS — E039 Hypothyroidism, unspecified: Secondary | ICD-10-CM | POA: Diagnosis not present

## 2013-10-02 DIAGNOSIS — L97509 Non-pressure chronic ulcer of other part of unspecified foot with unspecified severity: Secondary | ICD-10-CM | POA: Diagnosis not present

## 2013-10-02 DIAGNOSIS — F3289 Other specified depressive episodes: Secondary | ICD-10-CM

## 2013-10-02 DIAGNOSIS — I4891 Unspecified atrial fibrillation: Secondary | ICD-10-CM

## 2013-10-02 DIAGNOSIS — L97519 Non-pressure chronic ulcer of other part of right foot with unspecified severity: Secondary | ICD-10-CM | POA: Insufficient documentation

## 2013-10-02 DIAGNOSIS — I442 Atrioventricular block, complete: Secondary | ICD-10-CM

## 2013-10-02 DIAGNOSIS — N184 Chronic kidney disease, stage 4 (severe): Secondary | ICD-10-CM

## 2013-10-02 LAB — POCT CBC
Granulocyte percent: 79.3 %G (ref 37–80)
HCT, POC: 37.4 % — AB (ref 43.5–53.7)
Hemoglobin: 11.9 g/dL — AB (ref 14.1–18.1)
Lymph, poc: 1.5 (ref 0.6–3.4)
MCH, POC: 28.5 pg (ref 27–31.2)
MCHC: 31.7 g/dL — AB (ref 31.8–35.4)
MCV: 89.8 fL (ref 80–97)
MPV: 8.7 fL (ref 0–99.8)
POC Granulocyte: 6.4 (ref 2–6.9)
POC LYMPH PERCENT: 18.1 %L (ref 10–50)
Platelet Count, POC: 238 10*3/uL (ref 142–424)
RBC: 4.2 M/uL — AB (ref 4.69–6.13)
RDW, POC: 14.5 %
WBC: 8.1 10*3/uL (ref 4.6–10.2)

## 2013-10-02 LAB — POCT GLYCOSYLATED HEMOGLOBIN (HGB A1C): Hemoglobin A1C: 5.8

## 2013-10-02 LAB — POCT UA - MICROALBUMIN: Microalbumin Ur, POC: 20 mg/L

## 2013-10-02 MED ORDER — SULFAMETHOXAZOLE-TMP DS 800-160 MG PO TABS: 1.0000 | ORAL_TABLET | Freq: Two times a day (BID) | ORAL | Status: DC

## 2013-10-02 MED ORDER — CLOBETASOL PROPIONATE 0.05 % EX CREA: 1.0000 "application " | TOPICAL_CREAM | Freq: Two times a day (BID) | CUTANEOUS | Status: DC | PRN

## 2013-10-02 MED ORDER — CITALOPRAM HYDROBROMIDE 20 MG PO TABS: 20.0000 mg | ORAL_TABLET | Freq: Every day | ORAL | Status: DC

## 2013-10-02 MED ORDER — MUPIROCIN CALCIUM 2 % EX CREA: 1.0000 "application " | TOPICAL_CREAM | Freq: Two times a day (BID) | CUTANEOUS | Status: DC

## 2013-10-02 MED ORDER — ALPRAZOLAM 0.5 MG PO TABS: 0.5000 mg | ORAL_TABLET | Freq: Every evening | ORAL | Status: DC | PRN

## 2013-10-02 NOTE — Patient Instructions (Signed)
Diabetes and Foot Care Diabetes may cause you to have problems because of poor blood supply (circulation) to your feet and legs. This may cause the skin on your feet to become thinner, break easier, and heal more slowly. Your skin may become dry, and the skin may peel and crack. You may also have nerve damage in your legs and feet causing decreased feeling in them. You may not notice minor injuries to your feet that could lead to infections or more serious problems. Taking care of your feet is one of the most important things you can do for yourself.  HOME CARE INSTRUCTIONS  Wear shoes at all times, even in the house. Do not go barefoot. Bare feet are easily injured.  Check your feet daily for blisters, cuts, and redness. If you cannot see the bottom of your feet, use a mirror or ask someone for help.  Wash your feet with warm water (do not use hot water) and mild soap. Then pat your feet and the areas between your toes until they are completely dry. Do not soak your feet as this can dry your skin.  Apply a moisturizing lotion or petroleum jelly (that does not contain alcohol and is unscented) to the skin on your feet and to dry, brittle toenails. Do not apply lotion between your toes.  Trim your toenails straight across. Do not dig under them or around the cuticle. File the edges of your nails with an emery board or nail file.  Do not cut corns or calluses or try to remove them with medicine.  Wear clean socks or stockings every day. Make sure they are not too tight. Do not wear knee-high stockings since they may decrease blood flow to your legs.  Wear shoes that fit properly and have enough cushioning. To break in new shoes, wear them for just a few hours a day. This prevents you from injuring your feet. Always look in your shoes before you put them on to be sure there are no objects inside.  Do not cross your legs. This may decrease the blood flow to your feet.  If you find a minor scrape,  cut, or break in the skin on your feet, keep it and the skin around it clean and dry. These areas may be cleansed with mild soap and water. Do not cleanse the area with peroxide, alcohol, or iodine.  When you remove an adhesive bandage, be sure not to damage the skin around it.  If you have a wound, look at it several times a day to make sure it is healing.  Do not use heating pads or hot water bottles. They may burn your skin. If you have lost feeling in your feet or legs, you may not know it is happening until it is too late.  Make sure your health care provider performs a complete foot exam at least annually or more often if you have foot problems. Report any cuts, sores, or bruises to your health care provider immediately. SEEK MEDICAL CARE IF:   You have an injury that is not healing.  You have cuts or breaks in the skin.  You have an ingrown nail.  You notice redness on your legs or feet.  You feel burning or tingling in your legs or feet.  You have pain or cramps in your legs and feet.  Your legs or feet are numb.  Your feet always feel cold. SEEK IMMEDIATE MEDICAL CARE IF:   There is increasing redness,   swelling, or pain in or around a wound.  There is a red line that goes up your leg.  Pus is coming from a wound.  You develop a fever or as directed by your health care provider.  You notice a bad smell coming from an ulcer or wound. Document Released: 05/25/2000 Document Revised: 01/28/2013 Document Reviewed: 11/04/2012 [employer] Patient Information 2014 [employer]  

## 2013-10-02 NOTE — Progress Notes (Signed)
Patient ID: [name], male   DOB: 23-Apr-1922, 80 y.o.   MRN: [id number] SUBJECTIVE: CC: Chief Complaint  Patient presents with  . Follow-up    CK UP STATES HE HIS VERY SAD ABOUT NOT HAVING HIS CARETAKER AVALABLE ANYMORE AND NEEDS REFILLS ON CLOBETASOL, XANAX AND SOMTHING TO INCREASE HIS APPETITE. CK RT GREAT TOE WAS SEEN 3 WEEKS AGO AT [facility] AND COMPLETED ANTIBIOTIC FOR 10 DAYS     HPI: Patient is here for follow up of Diabetes Mellitus/HLD/CAD/A fib: Diet controlled. Not on DM medications. Symptoms evaluated: Denies Nocturia ,Denies Urinary Frequency , denies Blurred vision ,deniesDizziness,denies.Dysuria,denies paresthesias, denies extremity paindenies chest pain. has had an annual eye exam. do check the feet. Does check CBGs. Average CBG:102 Denies episodes of hypoglycemia. Does have an emergency hypoglycemic plan. admits toCompliance with medications. Denies Problems with medications. Has a ulcer on the bottom of the right great toe. Was treated at the [facility] clinic. Completed antibiotics. It is better.   Also, depressed with the loss of his wife and he thinks he has fallen in love with his wife's caretaker. The caretaker did not feel comfortable taking care of him because he expressed feeling for her and resigned. He is depressed about that. Not suicidal and not delusional, nor hallucinating. Past Medical History  Diagnosis Date  . Cancer     prostate ca with radiation in 2005  . Hypertension   . Cellulitis of face feb 2011    prostetic rt eye, cellulitis of rt side of face  . CAD (coronary artery disease)     (non Q wave MI in Jan 2009. He had chronically ocluded right coronary artery. He had a LAD w/90% stenosis at the take off of the first diagona. He had a drug-eluding stent palced by Dr Burt Knack)  . Complete heart block     Zephyr dual chamber pacemaker per Dr Caryl Comes  . Dyslipidemia   . Hypothyroid   . Macular degeneration   . Blindness of right eye      Related to trauma in [place]  . Diabetes mellitus without complication    Past Surgical History  Procedure Laterality Date  . Eye removed Right 1997   History   Social History  . Marital Status: Married    Spouse Name: N/A    Number of Children: N/A  . Years of Education: N/A   Occupational History  . Retired     Manufacturing engineer business   Social History Main Topics  . Smoking status: Former Smoker -- 2.00 packs/day for 65 years    Types: Cigarettes    Quit date: 09/11/2000  . Smokeless tobacco: Never Used  . Alcohol Use: No  . Drug Use: No  . Sexual Activity: Not on file   Other Topics Concern  . Not on file   Social History Narrative   Married w/3 children, lives with wife.          No family history on file. Current Outpatient Prescriptions on File Prior to Visit  Medication Sig Dispense Refill  . aspirin EC 81 MG tablet Take 81 mg by mouth daily.      . carboxymethylcellulose (REFRESH PLUS) 0.5 % SOLN 1 drop 3 (three) times daily as needed (for dry eyes).       . Chelated Potassium 99 MG TABS Take 1 tablet by mouth every other day.      . Chelated Zinc 50 MG TABS Take 50 mg by mouth every other day.      Marland Kitchen  esomeprazole (NEXIUM) 40 MG capsule Take 40 mg by mouth daily before breakfast.      . fenofibrate 54 MG tablet Take 54 mg by mouth daily.      Marland Kitchen levothyroxine (SYNTHROID, LEVOTHROID) 175 MCG tablet Take 1 tablet (175 mcg total) by mouth daily before breakfast.  90 tablet  3  . metoprolol succinate (TOPROL-XL) 50 MG 24 hr tablet Take 50 mg by mouth at bedtime. Take with or immediately following a meal.      . Multiple Vitamins-Minerals (MULTIVITAMIN,TX-MINERALS) tablet Take 1 tablet by mouth daily.        . nitroGLYCERIN (NITROSTAT) 0.4 MG SL tablet Place 1 tablet (0.4 mg total) under the tongue as needed.  30 tablet  2  . omega-3 acid ethyl esters (LOVAZA) 1 G capsule Take 2 capsules (2 g total) by mouth 2 (two) times daily.  360 capsule  3  . OVER THE COUNTER  MEDICATION Apply 1 application topically daily as needed (for scalp).      . pravastatin (PRAVACHOL) 20 MG tablet Take 1 tablet (20 mg total) by mouth daily.  90 tablet  3  . benzonatate (TESSALON) 200 MG capsule Take 1 capsule (200 mg total) by mouth 2 (two) times daily as needed for cough.  20 capsule  0  . clopidogrel (PLAVIX) 75 MG tablet Take 75 mg by mouth every morning.      Marland Kitchen esomeprazole (NEXIUM) 40 MG capsule TAKE 1 CAPSULE DAILY  90 capsule  3  . FREESTYLE LITE test strip USE TO TEST SUGARS ONCE DAILY  100 each  2  . scopolamine (TRANSDERM-SCOP) 1.5 MG Place 1 patch (1.5 mg total) onto the skin every 3 (three) days.  10 patch  2   No current facility-administered medications on file prior to visit.   No Known Allergies Immunization History  Administered Date(s) Administered  . Tetanus 09/11/2013   Prior to Admission medications   Medication Sig Start Date End Date Taking? Authorizing Provider  ALPRAZolam Duanne Moron) 0.5 MG tablet Take 1 tablet (0.5 mg total) by mouth at bedtime as needed for sleep. 06/02/13   Vernie Shanks, MD  aspirin EC 81 MG tablet Take 81 mg by mouth daily.    Historical Provider, MD  benzonatate (TESSALON) 200 MG capsule Take 1 capsule (200 mg total) by mouth 2 (two) times daily as needed for cough. 06/05/13   Mary-Margaret Hassell Done, FNP  carboxymethylcellulose (REFRESH PLUS) 0.5 % SOLN 1 drop 3 (three) times daily as needed (for dry eyes).     Historical Provider, MD  Chelated Potassium 99 MG TABS Take 1 tablet by mouth every other day.    Historical Provider, MD  Chelated Zinc 50 MG TABS Take 50 mg by mouth every other day.    Historical Provider, MD  clobetasol (TEMOVATE) 0.05 % external solution Apply 1 application topically 2 (two) times daily.    Historical Provider, MD  clobetasol cream (TEMOVATE) 3.66 % Apply 1 application topically 2 (two) times daily as needed. 03/05/13   Vernie Shanks, MD  clopidogrel (PLAVIX) 75 MG tablet Take 75 mg by mouth every  morning.    Historical Provider, MD  esomeprazole (NEXIUM) 40 MG capsule TAKE 1 CAPSULE DAILY 02/19/13   Vernie Shanks, MD  esomeprazole (NEXIUM) 40 MG capsule Take 40 mg by mouth daily before breakfast.    Historical Provider, MD  fenofibrate 54 MG tablet Take 54 mg by mouth daily.    Historical Provider, MD  FREESTYLE LITE  test strip USE TO TEST SUGARS ONCE DAILY    Vernie Shanks, MD  levofloxacin (LEVAQUIN) 500 MG tablet Take 1 tablet (500 mg total) by mouth daily. 06/02/13   Vernie Shanks, MD  levothyroxine (SYNTHROID, LEVOTHROID) 175 MCG tablet Take 1 tablet (175 mcg total) by mouth daily before breakfast. 03/06/13   Vernie Shanks, MD  metoprolol succinate (TOPROL-XL) 50 MG 24 hr tablet Take 50 mg by mouth at bedtime. Take with or immediately following a meal.    Historical Provider, MD  Multiple Vitamins-Minerals (MULTIVITAMIN,TX-MINERALS) tablet Take 1 tablet by mouth daily.      Historical Provider, MD  nitroGLYCERIN (NITROSTAT) 0.4 MG SL tablet Place 1 tablet (0.4 mg total) under the tongue as needed. 02/19/13   Vernie Shanks, MD  omega-3 acid ethyl esters (LOVAZA) 1 G capsule Take 2 capsules (2 g total) by mouth 2 (two) times daily. 03/05/13   Vernie Shanks, MD  OVER THE COUNTER MEDICATION Apply 1 application topically daily as needed (for scalp).    Historical Provider, MD  pravastatin (PRAVACHOL) 20 MG tablet Take 1 tablet (20 mg total) by mouth daily. 05/14/13   Vernie Shanks, MD  scopolamine (TRANSDERM-SCOP) 1.5 MG Place 1 patch (1.5 mg total) onto the skin every 3 (three) days. 04/09/13   Vernie Shanks, MD     ROS: As above in the HPI. All other systems are stable or negative.  OBJECTIVE: APPEARANCE:  Patient in no acute distress.The patient appeared well nourished and normally developed. Acyanotic. Waist: VITAL SIGNS:BP 155/61  Pulse 60  Temp(Src) 97.3 F (36.3 C) (Oral)  Ht 6' (1.829 m)  Wt 223 lb 6.4 oz (101.334 kg)  BMI 30.29 kg/m2  Obese WM  SKIN: warm and   Dry without overt rashes, tattoos and scars  HEAD and Neck: without JVD, Head and scalp: normal Eyes:No scleral icterus. Fundi normal, eye movements normal. Ears: Auricle normal, canal normal, Tympanic membranes normal, insufflation normal. Nose: normal Throat: normal Neck & thyroid: normal  CHEST & LUNGS: Chest wall: normal Lungs: Clear  CVS: Reveals the PMI to be normally located. Regular rhythm, First and Second Heart sounds are normal,  absence of murmurs, rubs or gallops. Peripheral vasculature: Radial pulses: normal Dorsal pedis pulses: normal Posterior pulses: normal  ABDOMEN:  Appearance: normal Benign, no organomegaly, no masses, no Abdominal Aortic enlargement. No Guarding , no rebound. No Bruits. Bowel sounds: normal  RECTAL: N/A GU: N/A  EXTREMETIES: nonedematous.  MUSCULOSKELETAL:  Spine: normal Joints: intact  NEUROLOGIC: oriented to time,place and person; nonfocal. Strength is normal Sensory is normal Reflexes are normal Cranial Nerves are normal.  ASSESSMENT: Foot ulcer, right - Plan: Sedimentation rate, mupirocin cream (BACTROBAN) 2 %, sulfamethoxazole-trimethoprim (BACTRIM DS) 800-160 MG per tablet, AMB referral to wound care center, POCT CBC  Pacemaker-[employer] Jude  DYSLIPIDEMIA - Plan: CMP14+EGFR, Lipid panel  DM (diabetes mellitus) - Plan: POCT glycosylated hemoglobin (Hb A1C), POCT UA - Microalbumin, CMP14+EGFR, Microalbumin, urine  Coronary artery disease- Status post drug-eluting stent  Chronic kidney disease (CKD), stage IV (severe)  CAD (coronary artery disease) of artery bypass graft  Atrial fibrillation-paroxysmal  AV BLOCK, COMPLETE  Unspecified hypothyroidism - Plan: TSH  Anxiety state, unspecified - Plan: ALPRAZolam (XANAX) 0.5 MG tablet  Depression - Plan: citalopram (CELEXA) 20 MG tablet  PLAN: Diabetes footcare Refer to wound center. Discussed with patient risks associated with foot ulcers.  Orders Placed This  Encounter  Procedures  . Sedimentation rate  . CMP14+EGFR  .  Lipid panel  . TSH  . Microalbumin, urine  . AMB referral to wound care center    Referral Priority:  Routine    Referral Type:  Consultation    Number of Visits Requested:  1  . POCT glycosylated hemoglobin (Hb A1C)  . POCT UA - Microalbumin  . POCT CBC   Meds ordered this encounter  Medications  . mupirocin cream (BACTROBAN) 2 %    Sig: Apply 1 application topically 2 (two) times daily.    Dispense:  30 g    Refill:  0  . sulfamethoxazole-trimethoprim (BACTRIM DS) 800-160 MG per tablet    Sig: Take 1 tablet by mouth 2 (two) times daily.    Dispense:  20 tablet    Refill:  0  . citalopram (CELEXA) 20 MG tablet    Sig: Take 1 tablet (20 mg total) by mouth daily.    Dispense:  90 tablet    Refill:  1  . ALPRAZolam (XANAX) 0.5 MG tablet    Sig: Take 1 tablet (0.5 mg total) by mouth at bedtime as needed for sleep.    Dispense:  90 tablet    Refill:  0  . clobetasol cream (TEMOVATE) 0.05 %    Sig: Apply 1 application topically 2 (two) times daily as needed.    Dispense:  60 g    Refill:  1   Medications Discontinued During This Encounter  Medication Reason  . clobetasol (TEMOVATE) 0.05 % external solution Duplicate  . levofloxacin (LEVAQUIN) 500 MG tablet Completed Course  . ALPRAZolam (XANAX) 0.5 MG tablet Reorder  . clobetasol cream (TEMOVATE) 0.05 % Reorder   Return in about 2 weeks (around 10/16/2013) for Recheck medical problems, in 2 weeks.Dub Mikes P. Jacelyn Grip, M.D.

## 2013-10-03 LAB — CMP14+EGFR
ALT: 14 IU/L (ref 0–44)
AST: 21 IU/L (ref 0–40)
Albumin/Globulin Ratio: 1.8 (ref 1.1–2.5)
Albumin: 4.2 g/dL (ref 3.5–4.7)
Alkaline Phosphatase: 44 IU/L (ref 39–117)
BUN/Creatinine Ratio: 13 (ref 10–22)
BUN: 21 mg/dL (ref 8–27)
CO2: 25 mmol/L (ref 18–29)
Calcium: 9.7 mg/dL (ref 8.6–10.2)
Chloride: 100 mmol/L (ref 97–108)
Creatinine, Ser: 1.65 mg/dL — ABNORMAL HIGH (ref 0.76–1.27)
GFR calc Af Amer: 42 mL/min/{1.73_m2} — ABNORMAL LOW (ref >59)
GFR calc non Af Amer: 36 mL/min/{1.73_m2} — ABNORMAL LOW (ref >59)
Globulin, Total: 2.3 g/dL (ref 1.5–4.5)
Glucose: 105 mg/dL — ABNORMAL HIGH (ref 65–99)
Potassium: 4.9 mmol/L (ref 3.5–5.2)
Sodium: 140 mmol/L (ref 134–144)
Total Bilirubin: 0.6 mg/dL (ref 0.0–1.2)
Total Protein: 6.5 g/dL (ref 6.0–8.5)

## 2013-10-03 LAB — TSH: TSH: 5.77 u[IU]/mL — ABNORMAL HIGH (ref 0.450–4.500)

## 2013-10-03 LAB — LIPID PANEL
Chol/HDL Ratio: 3.2 ratio units (ref 0.0–5.0)
Cholesterol, Total: 150 mg/dL (ref 100–199)
HDL: 47 mg/dL (ref >39)
LDL Calculated: 65 mg/dL (ref 0–99)
Triglycerides: 189 mg/dL — ABNORMAL HIGH (ref 0–149)
VLDL Cholesterol Cal: 38 mg/dL (ref 5–40)

## 2013-10-03 LAB — MICROALBUMIN, URINE: Microalbumin, Urine: 32.2 ug/mL — ABNORMAL HIGH (ref 0.0–17.0)

## 2013-10-03 LAB — SEDIMENTATION RATE: Sed Rate: 7 mm/hr (ref 0–30)

## 2013-10-04 ENCOUNTER — Other Ambulatory Visit: Payer: Self-pay | Admitting: Family Medicine

## 2013-10-04 DIAGNOSIS — E039 Hypothyroidism, unspecified: Secondary | ICD-10-CM

## 2013-10-04 MED ORDER — LEVOTHYROXINE SODIUM 200 MCG PO TABS: 200.0000 ug | ORAL_TABLET | Freq: Every day | ORAL | Status: DC

## 2013-10-05 ENCOUNTER — Other Ambulatory Visit: Payer: Self-pay | Admitting: Family Medicine

## 2013-10-07 ENCOUNTER — Telehealth: Payer: Self-pay | Admitting: Family Medicine

## 2013-10-07 DIAGNOSIS — I83009 Varicose veins of unspecified lower extremity with ulcer of unspecified site: Secondary | ICD-10-CM | POA: Diagnosis not present

## 2013-10-07 DIAGNOSIS — Z9861 Coronary angioplasty status: Secondary | ICD-10-CM | POA: Diagnosis not present

## 2013-10-07 DIAGNOSIS — L97509 Non-pressure chronic ulcer of other part of unspecified foot with unspecified severity: Secondary | ICD-10-CM | POA: Diagnosis not present

## 2013-10-07 DIAGNOSIS — E039 Hypothyroidism, unspecified: Secondary | ICD-10-CM

## 2013-10-07 DIAGNOSIS — Z9001 Acquired absence of eye: Secondary | ICD-10-CM | POA: Diagnosis not present

## 2013-10-07 DIAGNOSIS — I8390 Asymptomatic varicose veins of unspecified lower extremity: Secondary | ICD-10-CM | POA: Diagnosis not present

## 2013-10-07 DIAGNOSIS — E119 Type 2 diabetes mellitus without complications: Secondary | ICD-10-CM | POA: Diagnosis not present

## 2013-10-07 DIAGNOSIS — I499 Cardiac arrhythmia, unspecified: Secondary | ICD-10-CM | POA: Diagnosis not present

## 2013-10-07 DIAGNOSIS — E785 Hyperlipidemia, unspecified: Secondary | ICD-10-CM | POA: Diagnosis not present

## 2013-10-07 DIAGNOSIS — I4891 Unspecified atrial fibrillation: Secondary | ICD-10-CM | POA: Diagnosis not present

## 2013-10-07 DIAGNOSIS — I739 Peripheral vascular disease, unspecified: Secondary | ICD-10-CM | POA: Diagnosis not present

## 2013-10-07 DIAGNOSIS — Z8249 Family history of ischemic heart disease and other diseases of the circulatory system: Secondary | ICD-10-CM | POA: Diagnosis not present

## 2013-10-07 DIAGNOSIS — Z79899 Other long term (current) drug therapy: Secondary | ICD-10-CM | POA: Diagnosis not present

## 2013-10-07 DIAGNOSIS — I2510 Atherosclerotic heart disease of native coronary artery without angina pectoris: Secondary | ICD-10-CM | POA: Diagnosis not present

## 2013-10-07 DIAGNOSIS — Z951 Presence of aortocoronary bypass graft: Secondary | ICD-10-CM | POA: Diagnosis not present

## 2013-10-07 DIAGNOSIS — Z7982 Long term (current) use of aspirin: Secondary | ICD-10-CM | POA: Diagnosis not present

## 2013-10-07 DIAGNOSIS — Z950 Presence of cardiac pacemaker: Secondary | ICD-10-CM | POA: Diagnosis not present

## 2013-10-07 DIAGNOSIS — L97909 Non-pressure chronic ulcer of unspecified part of unspecified lower leg with unspecified severity: Secondary | ICD-10-CM | POA: Diagnosis not present

## 2013-10-07 NOTE — Telephone Encounter (Signed)
Message copied by Waverly Ferrari on Wed Oct 07, 2013 11:45 AM ------      Message from: Vernie Shanks      Created: Sun Oct 04, 2013 10:32 AM       Labs which are not at goal:      Thyroid medication needs to be adjusted.      The triglycerides is a little high      The chronic kidney disease is stable.            The rest are at goal.            Will need to make adjustments:            Recommend the following:       Limit carbohydrates      The levothyroxine does is adjusted up. Ordered in [facility].      Recheck thyroid in 6 weeks.                          ------

## 2013-10-07 NOTE — Telephone Encounter (Signed)
His thyroid med is to be adjusted.  He needs to know what he needs to take.  Call him at [phone]

## 2013-10-08 MED ORDER — LEVOTHYROXINE SODIUM 200 MCG PO TABS: 200.0000 ug | ORAL_TABLET | Freq: Every day | ORAL | Status: DC

## 2013-10-08 NOTE — Telephone Encounter (Signed)
Discussed with patient.  Prescription printed but not given to patient. He does use mail order but would like this filled at local pharm until he knows this dose is appropriate.  Order placed for medication and order for TSH.

## 2013-10-13 ENCOUNTER — Telehealth: Payer: Self-pay | Admitting: Family Medicine

## 2013-10-13 DIAGNOSIS — L97509 Non-pressure chronic ulcer of other part of unspecified foot with unspecified severity: Secondary | ICD-10-CM | POA: Diagnosis not present

## 2013-10-13 DIAGNOSIS — I739 Peripheral vascular disease, unspecified: Secondary | ICD-10-CM | POA: Diagnosis not present

## 2013-10-14 NOTE — Telephone Encounter (Signed)
Send in a prescription or needs to be seen?

## 2013-10-15 ENCOUNTER — Telehealth: Payer: Self-pay | Admitting: *Deleted

## 2013-10-15 ENCOUNTER — Other Ambulatory Visit: Payer: Self-pay | Admitting: Family Medicine

## 2013-10-15 DIAGNOSIS — I2510 Atherosclerotic heart disease of native coronary artery without angina pectoris: Secondary | ICD-10-CM | POA: Diagnosis not present

## 2013-10-15 DIAGNOSIS — K5901 Slow transit constipation: Secondary | ICD-10-CM

## 2013-10-15 DIAGNOSIS — I4891 Unspecified atrial fibrillation: Secondary | ICD-10-CM | POA: Diagnosis not present

## 2013-10-15 DIAGNOSIS — L97509 Non-pressure chronic ulcer of other part of unspecified foot with unspecified severity: Secondary | ICD-10-CM | POA: Diagnosis not present

## 2013-10-15 DIAGNOSIS — I739 Peripheral vascular disease, unspecified: Secondary | ICD-10-CM | POA: Diagnosis not present

## 2013-10-15 DIAGNOSIS — E1169 Type 2 diabetes mellitus with other specified complication: Secondary | ICD-10-CM | POA: Diagnosis not present

## 2013-10-15 MED ORDER — POLYETHYLENE GLYCOL 3350 17 G PO PACK: 17.0000 g | PACK | Freq: Every day | ORAL | Status: DC

## 2013-10-15 NOTE — Telephone Encounter (Signed)
Call patient : Prescription refilled & sent to pharmacy in [facility]. 

## 2013-10-15 NOTE — Telephone Encounter (Signed)
He has been using his late wifes Miralax, can he get an Rx sent to [facility]?

## 2013-10-15 NOTE — Telephone Encounter (Signed)
Contact isn't sure if [name] is taking the Citalopram. She will check with him and have him take it if he isn't. He will f/u as needed. Explained that SSRIs can take a few weeks to a month to really take affect.

## 2013-10-15 NOTE — Telephone Encounter (Signed)
i had started him on citalopram 20 mg at the last visit. He needs to be seen to adjust medications.

## 2013-10-16 NOTE — Telephone Encounter (Signed)
Aware, script sent in. 

## 2013-10-22 DIAGNOSIS — I739 Peripheral vascular disease, unspecified: Secondary | ICD-10-CM | POA: Diagnosis not present

## 2013-10-22 DIAGNOSIS — L97509 Non-pressure chronic ulcer of other part of unspecified foot with unspecified severity: Secondary | ICD-10-CM | POA: Diagnosis not present

## 2013-10-22 DIAGNOSIS — L98499 Non-pressure chronic ulcer of skin of other sites with unspecified severity: Secondary | ICD-10-CM | POA: Diagnosis not present

## 2013-10-27 ENCOUNTER — Encounter: Payer: Self-pay | Admitting: Vascular Surgery

## 2013-10-28 ENCOUNTER — Encounter: Payer: Medicare Other | Admitting: Vascular Surgery

## 2013-11-05 ENCOUNTER — Encounter (HOSPITAL_COMMUNITY): Payer: Medicare Other

## 2013-11-05 ENCOUNTER — Encounter: Payer: Medicare Other | Admitting: Vascular Surgery

## 2013-11-08 DIAGNOSIS — I2510 Atherosclerotic heart disease of native coronary artery without angina pectoris: Secondary | ICD-10-CM

## 2013-11-08 DIAGNOSIS — E1169 Type 2 diabetes mellitus with other specified complication: Secondary | ICD-10-CM | POA: Diagnosis not present

## 2013-11-08 DIAGNOSIS — L97509 Non-pressure chronic ulcer of other part of unspecified foot with unspecified severity: Secondary | ICD-10-CM | POA: Diagnosis not present

## 2013-11-08 DIAGNOSIS — I4891 Unspecified atrial fibrillation: Secondary | ICD-10-CM | POA: Diagnosis not present

## 2013-11-08 DIAGNOSIS — I739 Peripheral vascular disease, unspecified: Secondary | ICD-10-CM | POA: Diagnosis not present

## 2013-11-20 ENCOUNTER — Encounter: Payer: Self-pay | Admitting: Surgery

## 2013-11-23 ENCOUNTER — Ambulatory Visit (INDEPENDENT_AMBULATORY_CARE_PROVIDER_SITE_OTHER): Payer: Medicare Other | Admitting: Surgery

## 2013-11-23 ENCOUNTER — Encounter: Payer: Self-pay | Admitting: Surgery

## 2013-11-23 VITALS — BP 141/90 | HR 86 | Resp 18 | Ht 74.0 in | Wt 219.1 lb

## 2013-11-23 DIAGNOSIS — L97519 Non-pressure chronic ulcer of other part of right foot with unspecified severity: Secondary | ICD-10-CM | POA: Insufficient documentation

## 2013-11-23 DIAGNOSIS — L97509 Non-pressure chronic ulcer of other part of unspecified foot with unspecified severity: Secondary | ICD-10-CM | POA: Diagnosis not present

## 2013-11-23 DIAGNOSIS — L98499 Non-pressure chronic ulcer of skin of other sites with unspecified severity: Secondary | ICD-10-CM

## 2013-11-23 DIAGNOSIS — I70209 Unspecified atherosclerosis of native arteries of extremities, unspecified extremity: Secondary | ICD-10-CM | POA: Insufficient documentation

## 2013-11-23 NOTE — Progress Notes (Signed)
Patient name: [name] MRN: [id number] DOB: 1921/12/07 Sex: male   Referred by: Dr. Nils Pyle  Reason for referral:  Chief Complaint  Patient presents with  . New Evaluation    ulcer right great toe for 2 months  was treated at wound center now [place]   . Ulcer    HISTORY OF PRESENT ILLNESS: This is an 80 year old gentleman who was referred today for assistance with a right great toe ulcer.  The patient states that the ulcer has been there for approximately 3 months.  I initially he noted redness and swelling of his right leg, which resolved with antibiotics.  He tells me that the wound is getting smaller, according to the home health nurses to dress the wound twice a week.  The patient has had ultrasound studies performed.  This revealed an ankle brachial index of 0.99 on the left and 1.02 on the right.  Toe pressure was 49 on the right and 91 on the left.  Waveforms at the ankle were biphasic.  The patient has been diagnosed with diabetes.  This is diet controlled.  He has a history of coronary artery disease.  He has a pacemaker in place.  He also states that he has had a heart attack and coronary stenting.  Is hypercholesterolemia is managed with a statin.  He has chronic atrial fibrillation.  He is not anticoagulated.  He is a former smoker  Past Medical History  Diagnosis Date  . Cancer     prostate ca with radiation in 2005  . Hypertension   . Cellulitis of face feb 2011    prostetic rt eye, cellulitis of rt side of face  . CAD (coronary artery disease)     (non Q wave MI in Jan 2009. He had chronically ocluded right coronary artery. He had a LAD w/90% stenosis at the take off of the first diagona. He had a drug-eluding stent palced by Dr Burt Knack)  . Complete heart block     Zephyr dual chamber pacemaker per Dr Caryl Comes  . Dyslipidemia   . Hypothyroid   . Macular degeneration   . Blindness of right eye     Related to trauma in [place]  . Diabetes mellitus  without complication     Past Surgical History  Procedure Laterality Date  . Eye removed Right 1997    History   Social History  . Marital Status: Married    Spouse Name: N/A    Number of Children: N/A  . Years of Education: N/A   Occupational History  . Retired     Manufacturing engineer business   Social History Main Topics  . Smoking status: Former Smoker -- 2.00 packs/day for 65 years    Types: Cigarettes    Quit date: 09/11/2000  . Smokeless tobacco: Never Used  . Alcohol Use: 0.6 oz/week    1 Cans of beer per week     Comment: 1 beer daily  . Drug Use: No  . Sexual Activity: Not on file   Other Topics Concern  . Not on file   Social History Narrative   Married w/3 children, lives with wife.           History reviewed. No pertinent family history.  Allergies as of 11/23/2013  . (No Known Allergies)    Current Outpatient Prescriptions on File Prior to Visit  Medication Sig Dispense Refill  . aspirin EC 81 MG tablet Take 81 mg by mouth daily.      Marland Kitchen  benzonatate (TESSALON) 200 MG capsule Take 1 capsule (200 mg total) by mouth 2 (two) times daily as needed for cough.  20 capsule  0  . carboxymethylcellulose (REFRESH PLUS) 0.5 % SOLN 1 drop 3 (three) times daily as needed (for dry eyes).       . Chelated Potassium 99 MG TABS Take 1 tablet by mouth every other day.      . Chelated Zinc 50 MG TABS Take 50 mg by mouth every other day.      . citalopram (CELEXA) 20 MG tablet Take 1 tablet (20 mg total) by mouth daily.  90 tablet  1  . clobetasol cream (TEMOVATE) [id number] % Apply 1 application topically 2 (two) times daily as needed.  60 g  1  . esomeprazole (NEXIUM) 40 MG capsule Take 40 mg by mouth daily before breakfast.      . fenofibrate 54 MG tablet Take 54 mg by mouth daily.      Marland Kitchen FREESTYLE LITE test strip USE TO TEST SUGARS ONCE DAILY  100 each  2  . levothyroxine (SYNTHROID, LEVOTHROID) 200 MCG tablet Take 1 tablet (200 mcg total) by mouth daily before breakfast.  30  tablet  2  . metoprolol succinate (TOPROL-XL) 50 MG 24 hr tablet Take 50 mg by mouth at bedtime. Take with or immediately following a meal.      . Multiple Vitamins-Minerals (MULTIVITAMIN,TX-MINERALS) tablet Take 1 tablet by mouth daily.        . mupirocin cream (BACTROBAN) 2 % Apply 1 application topically 2 (two) times daily.  30 g  0  . nitroGLYCERIN (NITROSTAT) 0.4 MG SL tablet Place 1 tablet (0.4 mg total) under the tongue as needed.  30 tablet  2  . omega-3 acid ethyl esters (LOVAZA) 1 G capsule Take 2 capsules (2 g total) by mouth 2 (two) times daily.  360 capsule  3  . OVER THE COUNTER MEDICATION Apply 1 application topically daily as needed (for scalp).      . polyethylene glycol (MIRALAX / GLYCOLAX) packet Take 17 g by mouth daily. Until having a bowel movement  30 each  1  . ALPRAZolam (XANAX) 0.5 MG tablet Take 1 tablet (0.5 mg total) by mouth at bedtime as needed for sleep.  90 tablet  0  . clopidogrel (PLAVIX) 75 MG tablet Take 75 mg by mouth every morning.      Marland Kitchen esomeprazole (NEXIUM) 40 MG capsule TAKE 1 CAPSULE DAILY  90 capsule  3  . pravastatin (PRAVACHOL) 20 MG tablet Take 1 tablet (20 mg total) by mouth daily.  90 tablet  3  . scopolamine (TRANSDERM-SCOP) 1.5 MG Place 1 patch (1.5 mg total) onto the skin every 3 (three) days.  10 patch  2  . sulfamethoxazole-trimethoprim (BACTRIM DS) 800-160 MG per tablet Take 1 tablet by mouth 2 (two) times daily.  20 tablet  0   No current facility-administered medications on file prior to visit.     REVIEW OF SYSTEMS: Cardiovascular: No chest pain, chest pressure, palpitations, orthopnea, or dyspnea on exertion. No history of DVT or phlebitis. Pulmonary: No productive cough, asthma or wheezing. Neurologic: No weakness, paresthesias, aphasia, or amaurosis.  Positive for dizziness. Hematologic: No bleeding problems or clotting disorders. Musculoskeletal: No joint pain or joint swelling. Gastrointestinal: No blood in stool or  hematemesis Genitourinary: No dysuria or hematuria. Psychiatric:: No history of major depression. Integumentary: No rashes or ulcers. Constitutional: No fever or chills.  PHYSICAL EXAMINATION: General: The patient appears  their stated age.  Vital signs are BP 141/90  Pulse 86  Resp 18  Ht 6\' 2"  (1.88 m)  Wt 219 lb 1.6 oz (99.383 kg)  BMI 28.12 kg/m2 HEENT:  No gross abnormalities Pulmonary: Respirations are non-labored Abdomen: Soft and non-tender  Musculoskeletal: There are no major deformities.   Neurologic: No focal weakness or paresthesias are detected, Skin: Approximately 1 cm defect on the plantar aspect of the right great toe.  No surrounding erythema.  No drainage.  This wound is extremely superficial. Psychiatric: The patient has normal affect. Cardiovascular: There is a regular rate and rhythm without significant murmur appreciated.  Pedal pulses are not palpable  Diagnostic Studies: I have reviewed his outside ultrasound.  The ABI on the right is 1.02.  On the left is 0.99.  Toe pressure on the right is 49.  On the left is 91.  Waveforms are biphasic.    Assessment:  Nonhealing wound on the right toe, secondary to peripheral vascular disease and diabetes Plan: I discussed 2 options with the patient.  #1 would be to proceed with angiography and treatment of any hemodynamically significant lesion.  #2 would be continued wound care, given that the wound has slowly been improving.  After a well-informed discussion, the patient would like to continue with wound care.  I feel that this is reasonable given that he tells me there has been significant improvement over the last 3 months.  I am a little concerned because he has a toe pressure of 49 on the right.  In a diabetic, however I for this to be greater than 60 for wound healing.  The patient is still at risk for amputation should he not heal his wound.  However, given his age and comorbidities I feel it is reasonable to wait an  additional month and reevaluate detail before proceeding with intervention.  The patient will return in one month for a repeat evaluation.     Eldridge Abrahams, M.D. [facility] Office: [phone] Pager:  [phone]

## 2013-11-25 ENCOUNTER — Ambulatory Visit (INDEPENDENT_AMBULATORY_CARE_PROVIDER_SITE_OTHER): Payer: Medicare Other | Admitting: Internal Medicine

## 2013-11-25 ENCOUNTER — Encounter: Payer: Self-pay | Admitting: Internal Medicine

## 2013-11-25 VITALS — BP 146/74 | HR 70 | Ht 74.0 in | Wt 211.0 lb

## 2013-11-25 DIAGNOSIS — I442 Atrioventricular block, complete: Secondary | ICD-10-CM

## 2013-11-25 DIAGNOSIS — I480 Paroxysmal atrial fibrillation: Secondary | ICD-10-CM

## 2013-11-25 DIAGNOSIS — Z950 Presence of cardiac pacemaker: Secondary | ICD-10-CM | POA: Diagnosis not present

## 2013-11-25 DIAGNOSIS — I2510 Atherosclerotic heart disease of native coronary artery without angina pectoris: Secondary | ICD-10-CM

## 2013-11-25 DIAGNOSIS — I4891 Unspecified atrial fibrillation: Secondary | ICD-10-CM | POA: Diagnosis not present

## 2013-11-25 MED ORDER — RIVAROXABAN 20 MG PO TABS: 20.0000 mg | ORAL_TABLET | Freq: Every day | ORAL | Status: DC

## 2013-11-25 MED ORDER — FUROSEMIDE 20 MG PO TABS: 20.0000 mg | ORAL_TABLET | ORAL | Status: DC

## 2013-11-25 NOTE — Progress Notes (Signed)
Patient Care Team: Chipper Herb, MD as PCP - General   HPI  [name] is a 80 y.o. male seen in followup for a pacemaker implanted for CHB  2009.   He also has CAD with a chronically occluded right coronary artery. He had an LAD with 90% stenosis at the take off of the first diagonal. He had a drug-eluting stent placed by Dr. Burt Knack 2009  Myoview scan done 2013 was negative for ischemia;  LV function 60%  He also struggled with orthostatic intolerance.  Is noted no change in his exercise tolerance  Renal function and a GFR 30 09/14/2013  Past Medical History  Diagnosis Date  . Cancer     prostate ca with radiation in 2005  . Hypertension   . Cellulitis of face feb 2011    prostetic rt eye, cellulitis of rt side of face  . CAD (coronary artery disease)     (non Q wave MI in Jan 2009. He had chronically ocluded right coronary artery. He had a LAD w/90% stenosis at the take off of the first diagona. He had a drug-eluding stent palced by Dr Burt Knack)  . Complete heart block     Zephyr dual chamber pacemaker per Dr Caryl Comes  . Dyslipidemia   . Hypothyroid   . Macular degeneration   . Blindness of right eye     Related to trauma in [place]  . Diabetes mellitus without complication     Past Surgical History  Procedure Laterality Date  . Eye removed Right 1997    Current Outpatient Prescriptions  Medication Sig Dispense Refill  . ALPRAZolam (XANAX) 0.5 MG tablet Take 1 tablet (0.5 mg total) by mouth at bedtime as needed for sleep.  90 tablet  0  . aspirin EC 81 MG tablet Take 81 mg by mouth daily.      Marland Kitchen atorvastatin (LIPITOR) 20 MG tablet Take 20 mg by mouth daily.      . carboxymethylcellulose (REFRESH PLUS) 0.5 % SOLN 1 drop 3 (three) times daily as needed (for dry eyes).       . Chelated Potassium 99 MG TABS Take 1 tablet by mouth every other day.      . Chelated Zinc 50 MG TABS Take 50 mg by mouth every other day.      . citalopram (CELEXA) 20 MG tablet Take 1 tablet (20  mg total) by mouth daily.  90 tablet  1  . clobetasol cream (TEMOVATE) AB-123456789 % Apply 1 application topically 2 (two) times daily as needed.  60 g  1  . esomeprazole (NEXIUM) 40 MG capsule TAKE 1 CAPSULE DAILY  90 capsule  3  . fenofibrate 54 MG tablet Take 54 mg by mouth daily.      Marland Kitchen FREESTYLE LITE test strip USE TO TEST SUGARS ONCE DAILY  100 each  2  . levothyroxine (SYNTHROID, LEVOTHROID) 200 MCG tablet Take 1 tablet (200 mcg total) by mouth daily before breakfast.  30 tablet  2  . metoprolol succinate (TOPROL-XL) 50 MG 24 hr tablet Take 50 mg by mouth at bedtime. Take with or immediately following a meal.      . Multiple Vitamins-Minerals (MULTIVITAMIN,TX-MINERALS) tablet Take 1 tablet by mouth daily.        . mupirocin cream (BACTROBAN) 2 % Apply 1 application topically 2 (two) times daily.  30 g  0  . nitroGLYCERIN (NITROSTAT) 0.4 MG SL tablet Place 1 tablet (0.4 mg total) under the tongue as  needed.  30 tablet  2  . omega-3 acid ethyl esters (LOVAZA) 1 G capsule Take 2 capsules (2 g total) by mouth 2 (two) times daily.  360 capsule  3  . OVER THE COUNTER MEDICATION Apply 1 application topically daily as needed (for scalp).      . polyethylene glycol (MIRALAX / GLYCOLAX) packet Take 17 g by mouth daily. Until having a bowel movement  30 each  1  . pravastatin (PRAVACHOL) 20 MG tablet Take 1 tablet (20 mg total) by mouth daily.  90 tablet  3   No current facility-administered medications for this visit.    No Known Allergies  Review of Systems negative except from HPI and PMH  Physical Exam BP 146/74  Pulse 70  Ht 6\' 2"  (1.88 m)  Wt 211 lb (95.709 kg)  BMI 27.08 kg/m2 Well developed and well nourished in no acute distress HENT normal E scleral and icterus clear Neck Supple JVP flat; carotids brisk and full Clear to ausculation regular rate and rhythm   Soft with active bowel sounds No clubbing cyanosis 2+Edema Alert and oriented, wide-based and unstable gaitSkin Warm and  Dry    Assessment and  Plan  Congestive heart failure-chronic diastolic  Atrial fibrillation-persistent  Thrombolic risk factors hypertension-1 age-73 vascular disease-1  Pacemaker-St. Jude  We discussed the role of anticoagulation We discussed the use of the NOACs compared to Coumadin. We briefly reviewed the data of at least comparability in stroke prevention, bleeding and outcome. We discussed some of the new once wherein somewhat associated with decreased ischemic stroke risk, one to be taken daily, and has been shown to be comparable and bleeding risk to aspirin.  We also discussed bleeding associated with warfarin as well as NOACs and a wall bleeding as a complication of all these drugs intracranial bleeding is more frequently associated with warfarin then the NOACs and a GI bleeding is more commonly associated with the latter  We'll begin on. Rivaroxaban at a dose of 15 mg daily  Will not undertake cardioversion as there is no trivial symptoms to his atrial fibrillation. We'll stop his aspirin.  He is evidence of volume overload and we will begin him on furosemide 20 mg every other day and recheck his metabolic profile in 2 weeks time

## 2013-11-25 NOTE — Patient Instructions (Addendum)
Your physician has recommended you make the following change in your medication:  1) STOP Aspirin 2) START Xarelto 20 mg daily 3) START Furosemide 20 mg every other day  Your physician recommends that you return for lab work in: 2 weeks for BMET  Continue to follow up with [facility] every 3 months (telephone pacemaker check).  Your physician recommends that you schedule a follow-up appointment in: 12 months with Dr.Klein

## 2013-11-30 LAB — MDC_IDC_ENUM_SESS_TYPE_INCLINIC
Battery Impedance: 1700 Ohm
Battery Voltage: 2.76 V
Implantable Pulse Generator Model: 5826
Lead Channel Impedance Value: 358 Ohm
Lead Channel Impedance Value: 406 Ohm
Lead Channel Pacing Threshold Pulse Width: 0.4 ms
Lead Channel Sensing Intrinsic Amplitude: 0.3 mV
Lead Channel Setting Pacing Amplitude: 2 V
Lead Channel Setting Pacing Pulse Width: 0.4 ms
Lead Channel Setting Sensing Sensitivity: 2 mV
MDC IDC MSMT LEADCHNL RV PACING THRESHOLD AMPLITUDE: 0.875 V
MDC IDC MSMT LEADCHNL RV SENSING INTR AMPL: 6.2 mV
MDC IDC PG SERIAL: 2010227
MDC IDC SESS DTM: 20150617192459

## 2013-12-02 ENCOUNTER — Encounter: Payer: Self-pay | Admitting: Internal Medicine

## 2013-12-07 ENCOUNTER — Other Ambulatory Visit: Payer: Self-pay | Admitting: *Deleted

## 2013-12-07 MED ORDER — RIVAROXABAN 20 MG PO TABS: 20.0000 mg | ORAL_TABLET | Freq: Every day | ORAL | Status: DC

## 2013-12-09 ENCOUNTER — Other Ambulatory Visit (INDEPENDENT_AMBULATORY_CARE_PROVIDER_SITE_OTHER): Payer: Medicare Other

## 2013-12-09 DIAGNOSIS — I442 Atrioventricular block, complete: Secondary | ICD-10-CM

## 2013-12-09 DIAGNOSIS — I4891 Unspecified atrial fibrillation: Secondary | ICD-10-CM | POA: Diagnosis not present

## 2013-12-09 DIAGNOSIS — I480 Paroxysmal atrial fibrillation: Secondary | ICD-10-CM

## 2013-12-10 LAB — BMP8+EGFR
BUN/Creatinine Ratio: 14 (ref 10–22)
BUN: 26 mg/dL (ref 8–27)
CO2: 20 mmol/L (ref 18–29)
Calcium: 9.7 mg/dL (ref 8.6–10.2)
Chloride: 101 mmol/L (ref 97–108)
Creatinine, Ser: 1.82 mg/dL — ABNORMAL HIGH (ref 0.76–1.27)
GFR, EST AFRICAN AMERICAN: 37 mL/min/{1.73_m2} — AB (ref >59)
GFR, EST NON AFRICAN AMERICAN: 32 mL/min/{1.73_m2} — AB (ref >59)
GLUCOSE: 108 mg/dL — AB (ref 65–99)
Potassium: 4.4 mmol/L (ref 3.5–5.2)
Sodium: 142 mmol/L (ref 134–144)

## 2014-01-01 ENCOUNTER — Encounter: Payer: Self-pay | Admitting: Surgery

## 2014-01-04 ENCOUNTER — Ambulatory Visit: Payer: Medicare Other | Admitting: Surgery

## 2014-01-15 ENCOUNTER — Telehealth: Payer: Self-pay | Admitting: Family Medicine

## 2014-01-15 MED ORDER — ESOMEPRAZOLE MAGNESIUM 40 MG PO CPDR: DELAYED_RELEASE_CAPSULE | ORAL | Status: DC

## 2014-01-15 NOTE — Telephone Encounter (Signed)
done

## 2014-02-08 ENCOUNTER — Ambulatory Visit: Payer: Medicare Other | Admitting: Surgery

## 2014-02-24 ENCOUNTER — Telehealth: Payer: Self-pay | Admitting: Family Medicine

## 2014-02-24 DIAGNOSIS — Z950 Presence of cardiac pacemaker: Secondary | ICD-10-CM | POA: Diagnosis not present

## 2014-02-24 DIAGNOSIS — I442 Atrioventricular block, complete: Secondary | ICD-10-CM | POA: Diagnosis not present

## 2014-03-02 ENCOUNTER — Ambulatory Visit: Payer: Medicare Other | Admitting: Family Medicine

## 2014-03-04 ENCOUNTER — Other Ambulatory Visit: Payer: Self-pay

## 2014-03-04 MED ORDER — METOPROLOL SUCCINATE ER 50 MG PO TB24: 50.0000 mg | ORAL_TABLET | Freq: Every day | ORAL | Status: DC

## 2014-03-04 NOTE — Telephone Encounter (Signed)
Last seen 10/02/13  [facility]  This is mail order

## 2014-03-11 ENCOUNTER — Encounter: Payer: Self-pay | Admitting: Family Medicine

## 2014-03-11 ENCOUNTER — Ambulatory Visit (INDEPENDENT_AMBULATORY_CARE_PROVIDER_SITE_OTHER): Payer: Medicare Other | Admitting: Family Medicine

## 2014-03-11 VITALS — BP 131/75 | HR 70 | Temp 97.5°F | Ht 74.0 in | Wt 217.0 lb

## 2014-03-11 DIAGNOSIS — E785 Hyperlipidemia, unspecified: Secondary | ICD-10-CM

## 2014-03-11 DIAGNOSIS — F411 Generalized anxiety disorder: Secondary | ICD-10-CM

## 2014-03-11 DIAGNOSIS — N189 Chronic kidney disease, unspecified: Secondary | ICD-10-CM

## 2014-03-11 DIAGNOSIS — E039 Hypothyroidism, unspecified: Secondary | ICD-10-CM

## 2014-03-11 DIAGNOSIS — E1122 Type 2 diabetes mellitus with diabetic chronic kidney disease: Secondary | ICD-10-CM | POA: Diagnosis not present

## 2014-03-11 DIAGNOSIS — I2510 Atherosclerotic heart disease of native coronary artery without angina pectoris: Secondary | ICD-10-CM

## 2014-03-11 DIAGNOSIS — E1129 Type 2 diabetes mellitus with other diabetic kidney complication: Secondary | ICD-10-CM | POA: Diagnosis not present

## 2014-03-11 MED ORDER — ALPRAZOLAM 0.5 MG PO TABS: 0.5000 mg | ORAL_TABLET | Freq: Every evening | ORAL | Status: DC | PRN

## 2014-03-11 NOTE — Progress Notes (Signed)
   Subjective:    Patient ID: [name], male    DOB: 1921/10/21, 80 y.o.   MRN: [id number]  HPI 80 year old gentleman, widower lives by himself and does pretty well since he lost his wife about a year ago. He sees some doctors at the [facility]. We spent time today talking about medicines and agreed to discontinue some of his lipid-lowering agents and cut back on others the He has no specific complaints and just wants his Xanax refilled her    Review of Systems  Constitutional: Negative.   HENT: Negative.   Respiratory: Positive for shortness of breath (occasional with exertion).   Gastrointestinal: Negative.   Endocrine: Negative.   Genitourinary: Negative.   Musculoskeletal: Negative.   Neurological: Negative.   Hematological: Negative.        Objective:   Physical Exam  Constitutional: He is oriented to person, place, and time. He appears well-developed and well-nourished.  HENT:  Head: Normocephalic.  Right Ear: External ear normal.  Left Ear: External ear normal.  Nose: Nose normal.  Mouth/Throat: Oropharynx is clear and moist.  Eyes: Conjunctivae and EOM are normal. Pupils are equal, round, and reactive to light.  Neck: Normal range of motion. Neck supple.  Cardiovascular: Normal rate, regular rhythm, normal heart sounds and intact distal pulses.   Pulmonary/Chest: Effort normal and breath sounds normal.  Abdominal: Soft. Bowel sounds are normal.  Musculoskeletal: Normal range of motion.  Neurological: He is alert and oriented to person, place, and time.  Skin: Skin is warm and dry.  Psychiatric: He has a normal mood and affect. His behavior is normal. Judgment and thought content normal.    BP 131/75  Pulse 70  Temp(Src) 97.5 F (36.4 C) (Oral)  Ht 6\' 2"  (1.88 m)  Wt 217 lb (98.431 kg)  BMI 27.85 kg/m2      Assessment & Plan:  1. Type 2 diabetes mellitus with diabetic chronic kidney disease   2. Hyperlipidemia   3. Hypothyroidism, unspecified  hypothyroidism type Misunderstood dose at last visit.  Nedd to check next visit  4. Anxiety state  - ALPRAZolam (XANAX) 0.5 MG tablet; Take 1 tablet (0.5 mg total) by mouth at bedtime as needed for sleep.  Dispense: 90 tablet; Refill: 0  [name] MD

## 2014-03-11 NOTE — Patient Instructions (Signed)
Stop fenofibrate, atorvastatin and take two fish oil caps instead of fourHealth Maintenance A healthy lifestyle and preventative care can promote health and wellness.  Maintain regular health, dental, and eye exams.  Eat a healthy diet. Foods like vegetables, fruits, whole grains, low-fat dairy products, and lean protein foods contain the nutrients you need and are low in calories. Decrease your intake of foods high in solid fats, added sugars, and salt. Get information about a proper diet from your health care provider, if necessary.  Regular physical exercise is one of the most important things you can do for your health. Most adults should get at least 150 minutes of moderate-intensity exercise (any activity that increases your heart rate and causes you to sweat) each week. In addition, most adults need muscle-strengthening exercises on 2 or more days a week.   Maintain a healthy weight. The body mass index (BMI) is a screening tool to identify possible weight problems. It provides an estimate of body fat based on height and weight. Your health care provider can find your BMI and can help you achieve or maintain a healthy weight. For males 20 years and older:  A BMI below 18.5 is considered underweight.  A BMI of 18.5 to 24.9 is normal.  A BMI of 25 to 29.9 is considered overweight.  A BMI of 30 and above is considered obese.  Maintain normal blood lipids and cholesterol by exercising and minimizing your intake of saturated fat. Eat a balanced diet with plenty of fruits and vegetables. Blood tests for lipids and cholesterol should begin at age 9 and be repeated every 5 years. If your lipid or cholesterol levels are high, you are over age 37, or you are at high risk for heart disease, you may need your cholesterol levels checked more frequently.Ongoing high lipid and cholesterol levels should be treated with medicines if diet and exercise are not working.  If you smoke, find out from your  health care provider how to quit. If you do not use tobacco, do not start.  Lung cancer screening is recommended for adults aged 73-80 years who are at high risk for developing lung cancer because of a history of smoking. A yearly low-dose CT scan of the lungs is recommended for people who have at least a 30-pack-year history of smoking and are current smokers or have quit within the past 15 years. A pack year of smoking is smoking an average of 1 pack of cigarettes a day for 1 year (for example, a 30-pack-year history of smoking could mean smoking 1 pack a day for 30 years or 2 packs a day for 15 years). Yearly screening should continue until the smoker has stopped smoking for at least 15 years. Yearly screening should be stopped for people who develop a health problem that would prevent them from having lung cancer treatment.  If you choose to drink alcohol, do not have more than 2 drinks per day. One drink is considered to be 12 oz (360 mL) of beer, 5 oz (150 mL) of wine, or 1.5 oz (45 mL) of liquor.  Avoid the use of street drugs. Do not share needles with anyone. Ask for help if you need support or instructions about stopping the use of drugs.  High blood pressure causes heart disease and increases the risk of stroke. Blood pressure should be checked at least every 1-2 years. Ongoing high blood pressure should be treated with medicines if weight loss and exercise are not effective.  If  you are 67-34 years old, ask your health care provider if you should take aspirin to prevent heart disease.  Diabetes screening involves taking a blood sample to check your fasting blood sugar level. This should be done once every 3 years after age 62 if you are at a normal weight and without risk factors for diabetes. Testing should be considered at a younger age or be carried out more frequently if you are overweight and have at least 1 risk factor for diabetes.  Colorectal cancer can be detected and often  prevented. Most routine colorectal cancer screening begins at the age of 76 and continues through age 45. However, your health care provider may recommend screening at an earlier age if you have risk factors for colon cancer. On a yearly basis, your health care provider may provide home test kits to check for hidden blood in the stool. A small camera at the end of a tube may be used to directly examine the colon (sigmoidoscopy or colonoscopy) to detect the earliest forms of colorectal cancer. Talk to your health care provider about this at age 6 when routine screening begins. A direct exam of the colon should be repeated every 5-10 years through age 41, unless early forms of precancerous polyps or small growths are found.  People who are at an increased risk for hepatitis B should be screened for this virus. You are considered at high risk for hepatitis B if:  You were born in a country where hepatitis B occurs often. Talk with your health care provider about which countries are considered high risk.  Your parents were born in a high-risk country and you have not received a shot to protect against hepatitis B (hepatitis B vaccine).  You have HIV or AIDS.  You use needles to inject street drugs.  You live with, or have sex with, someone who has hepatitis B.  You are a man who has sex with other men (MSM).  You get hemodialysis treatment.  You take certain medicines for conditions like cancer, organ transplantation, and autoimmune conditions.  Hepatitis C blood testing is recommended for all people born from [birth year] through 1963 and any individual with known risk factors for hepatitis C.  Healthy men should no longer receive prostate-specific antigen (PSA) blood tests as part of routine cancer screening. Talk to your health care provider about prostate cancer screening.  Testicular cancer screening is not recommended for adolescents or adult males who have no symptoms. Screening includes  self-exam, a health care provider exam, and other screening tests. Consult with your health care provider about any symptoms you have or any concerns you have about testicular cancer.  Practice safe sex. Use condoms and avoid high-risk sexual practices to reduce the spread of sexually transmitted infections (STIs).  You should be screened for STIs, including gonorrhea and chlamydia if:  You are sexually active and are younger than 24 years.  You are older than 24 years, and your health care provider tells you that you are at risk for this type of infection.  Your sexual activity has changed since you were last screened, and you are at an increased risk for chlamydia or gonorrhea. Ask your health care provider if you are at risk.  If you are at risk of being infected with HIV, it is recommended that you take a prescription medicine daily to prevent HIV infection. This is called pre-exposure prophylaxis (PrEP). You are considered at risk if:  You are a man who has sex  with other men (MSM).  You are a heterosexual man who is sexually active with multiple partners.  You take drugs by injection.  You are sexually active with a partner who has HIV.  Talk with your health care provider about whether you are at high risk of being infected with HIV. If you choose to begin PrEP, you should first be tested for HIV. You should then be tested every 3 months for as long as you are taking PrEP.  Use sunscreen. Apply sunscreen liberally and repeatedly throughout the day. You should seek shade when your shadow is shorter than you. Protect yourself by wearing long sleeves, pants, a wide-brimmed hat, and sunglasses year round whenever you are outdoors.  Tell your health care provider of new moles or changes in moles, especially if there is a change in shape or color. Also, tell your health care provider if a mole is larger than the size of a pencil eraser.  A one-time screening for abdominal aortic aneurysm  (AAA) and surgical repair of large AAAs by ultrasound is recommended for men aged 89-75 years who are current or former smokers.  Stay current with your vaccines (immunizations). Document Released: 11/24/2007 Document Revised: 06/02/2013 Document Reviewed: 10/23/2010 [facility] Patient Information 2015 [place], [place]. This information is not intended to replace advice given to you by your health care provider. Make sure you discuss any questions you have with your health care provider.

## 2014-03-11 NOTE — Addendum Note (Signed)
Addended by: Ilean China on: 03/11/2014 05:16 PM   Modules accepted: Orders

## 2014-03-11 NOTE — Progress Notes (Signed)
   Subjective:    Patient ID: [name], male    DOB: [birth year], 80 y.o.   MRN: [id number]  HPI  Patient Active Problem List   Diagnosis Date Noted  . Atherosclerotic PVD with ulceration 11/23/2013  . Chronic ulcer of right great toe 11/23/2013  . Depression 10/02/2013  . Anxiety state, unspecified 10/02/2013  . Foot ulcer, right 10/02/2013  . Heart burn 06/02/2013  . CAP (community acquired pneumonia) 06/02/2013  . Cough 06/02/2013  . Chronic kidney disease (CKD), stage IV (severe) 09/30/2012  . CAD (coronary artery disease) of artery bypass graft 09/30/2012  . Hypothyroidism 09/30/2012  . DM (diabetes mellitus) 09/30/2012  . Atrial fibrillation-paroxysmal 09/12/2010  . Coronary artery disease- Status post drug-eluting stent 09/12/2010  . Hyperlipidemia 02/02/2010  . AV BLOCK, COMPLETE 02/02/2010  . ORTHOSTATIC DIZZINESS 02/02/2010  . DYSPNEA ON EXERTION 02/02/2010  . Pacemaker-St. Jude 02/02/2010   Outpatient Encounter Prescriptions as of 03/11/2014  Medication Sig  . ALPRAZolam (XANAX) 0.5 MG tablet Take 1 tablet (0.5 mg total) by mouth at bedtime as needed for sleep.  . carboxymethylcellulose (REFRESH PLUS) 0.5 % SOLN 1 drop 3 (three) times daily as needed (for dry eyes).   . Chelated Potassium 99 MG TABS Take 1 tablet by mouth every other day.  . Chelated Zinc 50 MG TABS Take 50 mg by mouth every other day.  . citalopram (CELEXA) 20 MG tablet Take 1 tablet (20 mg total) by mouth daily.  . clobetasol cream (TEMOVATE) AB-123456789 % Apply 1 application topically 2 (two) times daily as needed.  Marland Kitchen esomeprazole (NEXIUM) 40 MG capsule TAKE 1 CAPSULE DAILY  . fenofibrate 54 MG tablet Take 54 mg by mouth daily.  Marland Kitchen FREESTYLE LITE test strip USE TO TEST SUGARS ONCE DAILY  . metoprolol succinate (TOPROL-XL) 50 MG 24 hr tablet Take 1 tablet (50 mg total) by mouth at bedtime. Take with or immediately following a meal.  . Multiple Vitamins-Minerals (MULTIVITAMIN,TX-MINERALS) tablet Take 1  tablet by mouth daily.    . mupirocin cream (BACTROBAN) 2 % Apply 1 application topically 2 (two) times daily.  Marland Kitchen omega-3 acid ethyl esters (LOVAZA) 1 G capsule Take 2 capsules (2 g total) by mouth 2 (two) times daily.  Marland Kitchen OVER THE COUNTER MEDICATION Apply 1 application topically daily as needed (for scalp).  . polyethylene glycol (MIRALAX / GLYCOLAX) packet Take 17 g by mouth daily. Until having a bowel movement  . pravastatin (PRAVACHOL) 20 MG tablet Take 1 tablet (20 mg total) by mouth daily.  . rivaroxaban (XARELTO) 20 MG TABS tablet Take 1 tablet (20 mg total) by mouth daily with supper.  . furosemide (LASIX) 20 MG tablet Take 1 tablet (20 mg total) by mouth every other day.  . levothyroxine (SYNTHROID, LEVOTHROID) 200 MCG tablet Take 1 tablet (200 mcg total) by mouth daily before breakfast.  . nitroGLYCERIN (NITROSTAT) 0.4 MG SL tablet Place 1 tablet (0.4 mg total) under the tongue as needed.  . [DISCONTINUED] atorvastatin (LIPITOR) 20 MG tablet Take 20 mg by mouth daily.  . [DISCONTINUED] rivaroxaban (XARELTO) 20 MG TABS tablet Take 1 tablet (20 mg total) by mouth daily with supper.     Review of Systems     Objective:   Physical Exam BP 131/75  Pulse 70  Temp(Src) 97.5 F (36.4 C) (Oral)  Ht 6\' 2"  (1.88 m)  Wt 217 lb (98.431 kg)  BMI 27.85 kg/m2        Assessment & Plan:

## 2014-03-12 LAB — CMP14+EGFR
A/G RATIO: 1.7 (ref 1.1–2.5)
ALBUMIN: 4.3 g/dL (ref 3.5–4.7)
ALT: 12 IU/L (ref 0–44)
AST: 20 IU/L (ref 0–40)
Alkaline Phosphatase: 43 IU/L (ref 39–117)
BILIRUBIN TOTAL: 0.5 mg/dL (ref 0.0–1.2)
BUN/Creatinine Ratio: 13 (ref 10–22)
BUN: 20 mg/dL (ref 8–27)
CO2: 24 mmol/L (ref 18–29)
CREATININE: 1.58 mg/dL — AB (ref 0.76–1.27)
Calcium: 9.2 mg/dL (ref 8.6–10.2)
Chloride: 99 mmol/L (ref 97–108)
GFR, EST AFRICAN AMERICAN: 44 mL/min/{1.73_m2} — AB (ref >59)
GFR, EST NON AFRICAN AMERICAN: 38 mL/min/{1.73_m2} — AB (ref >59)
GLOBULIN, TOTAL: 2.5 g/dL (ref 1.5–4.5)
GLUCOSE: 90 mg/dL (ref 65–99)
Potassium: 4.6 mmol/L (ref 3.5–5.2)
Sodium: 141 mmol/L (ref 134–144)
TOTAL PROTEIN: 6.8 g/dL (ref 6.0–8.5)

## 2014-03-12 LAB — LIPID PANEL
CHOL/HDL RATIO: 5.3 ratio — AB (ref 0.0–5.0)
CHOLESTEROL TOTAL: 245 mg/dL — AB (ref 100–199)
HDL: 46 mg/dL (ref >39)
LDL CALC: 156 mg/dL — AB (ref 0–99)
Triglycerides: 215 mg/dL — ABNORMAL HIGH (ref 0–149)
VLDL Cholesterol Cal: 43 mg/dL — ABNORMAL HIGH (ref 5–40)

## 2014-03-12 LAB — TSH: TSH: 7.1 u[IU]/mL — ABNORMAL HIGH (ref 0.450–4.500)

## 2014-03-12 NOTE — Telephone Encounter (Signed)
Message copied by Waverly Ferrari on Fri Mar 12, 2014 10:44 AM ------      Message from: [name]      Created: Fri Mar 12, 2014  0000000 AM       Metabolic panel shows decreased renal function consistent with age and medical history; no specific treatment at this time. Thyroid function supports the need for increased dose of thyroid supplement to 200 mcg; we discussed this at his visit; we should repeat thyroid testing in 2 months. Lipid levels remain elevated but given his age I would not be aggressive with treatment. We discussed in discontinuation of meds and I would go-ahead with that plan. ------

## 2014-03-19 ENCOUNTER — Telehealth: Payer: Self-pay | Admitting: Family Medicine

## 2014-03-25 ENCOUNTER — Telehealth: Payer: Self-pay | Admitting: Family Medicine

## 2014-03-26 NOTE — Telephone Encounter (Signed)
Pt aware of lab results, started on new thyroid dosage couple days ago. Telephone has been out of order for a week and a half.

## 2014-03-28 ENCOUNTER — Other Ambulatory Visit: Payer: Self-pay | Admitting: Family Medicine

## 2014-04-30 ENCOUNTER — Other Ambulatory Visit (INDEPENDENT_AMBULATORY_CARE_PROVIDER_SITE_OTHER): Payer: Medicare Other

## 2014-04-30 DIAGNOSIS — E039 Hypothyroidism, unspecified: Secondary | ICD-10-CM | POA: Diagnosis not present

## 2014-04-30 NOTE — Progress Notes (Signed)
Lab only 

## 2014-05-01 LAB — TSH: TSH: 9.83 u[IU]/mL — AB (ref 0.450–4.500)

## 2014-05-03 ENCOUNTER — Other Ambulatory Visit: Payer: Self-pay | Admitting: Family Medicine

## 2014-05-03 DIAGNOSIS — E039 Hypothyroidism, unspecified: Secondary | ICD-10-CM

## 2014-05-03 MED ORDER — LEVOTHYROXINE SODIUM 200 MCG PO TABS: 200.0000 ug | ORAL_TABLET | Freq: Every day | ORAL | Status: DC

## 2014-05-03 NOTE — Telephone Encounter (Signed)
done

## 2014-05-15 ENCOUNTER — Other Ambulatory Visit: Payer: Self-pay | Admitting: Family Medicine

## 2014-05-19 ENCOUNTER — Encounter: Payer: Self-pay | Admitting: Family Medicine

## 2014-05-19 ENCOUNTER — Ambulatory Visit (INDEPENDENT_AMBULATORY_CARE_PROVIDER_SITE_OTHER): Payer: Medicare Other | Admitting: Family Medicine

## 2014-05-19 VITALS — BP 151/78 | HR 70 | Temp 96.6°F | Ht 74.0 in | Wt 212.0 lb

## 2014-05-19 DIAGNOSIS — R42 Dizziness and giddiness: Secondary | ICD-10-CM | POA: Diagnosis not present

## 2014-05-19 DIAGNOSIS — I2510 Atherosclerotic heart disease of native coronary artery without angina pectoris: Secondary | ICD-10-CM

## 2014-05-19 MED ORDER — FLUTICASONE PROPIONATE 50 MCG/ACT NA SUSP: 2.0000 | Freq: Every day | NASAL | Status: DC

## 2014-05-19 NOTE — Progress Notes (Signed)
   Subjective:    Patient ID: [name], male    DOB: [birth year], 80 y.o.   MRN: [id number]  HPI 80 year old gentleman with persistent woozy head. He does not endorse vertigo but really more of lightheadedness. He does have the sensation of water in his right ear. As we talk about his symptoms he does have low vision having lost his right eye many years ago and World War [place] and the left eye has poor vision and this may be contributing some to his symptoms. He has had CT scans which show probable liver infarcts and is on Plavix as result.    Review of Systems  Eyes: Positive for visual disturbance.  Neurological: Positive for dizziness and light-headedness.       Objective:   Physical Exam  HENT:  Excess cerumen and right EAC and both tympanic membranes are dull suggesting effusion  Neurological: Coordination normal.    BP 151/78 mmHg  Pulse 70  Temp(Src) 96.6 F (35.9 C) (Oral)  Ht 6\' 2"  (1.88 m)  Wt 212 lb (96.163 kg)  BMI 27.21 kg/m2      Assessment & Plan:  1. ORTHOSTATIC DIZZINESS Dizziness is probably multifactorial related to low vision, middle ear effusion, and possibly circulatory deficit as regards his inner ear or cerebellum. I'll try Flonase to see if we can open up his eustachian tubes and help effusion is a year but this is the only correctable cause of the dizziness that I can affect  [name] MD

## 2014-05-31 ENCOUNTER — Encounter: Payer: Self-pay | Admitting: Internal Medicine

## 2014-05-31 DIAGNOSIS — I442 Atrioventricular block, complete: Secondary | ICD-10-CM

## 2014-06-10 ENCOUNTER — Ambulatory Visit (INDEPENDENT_AMBULATORY_CARE_PROVIDER_SITE_OTHER): Payer: Medicare Other | Admitting: Family Medicine

## 2014-06-10 ENCOUNTER — Encounter: Payer: Self-pay | Admitting: Family Medicine

## 2014-06-10 VITALS — BP 128/70 | HR 69 | Temp 96.7°F | Ht 74.0 in | Wt 217.4 lb

## 2014-06-10 DIAGNOSIS — R300 Dysuria: Secondary | ICD-10-CM | POA: Diagnosis not present

## 2014-06-10 DIAGNOSIS — L089 Local infection of the skin and subcutaneous tissue, unspecified: Secondary | ICD-10-CM | POA: Diagnosis not present

## 2014-06-10 DIAGNOSIS — I2510 Atherosclerotic heart disease of native coronary artery without angina pectoris: Secondary | ICD-10-CM | POA: Diagnosis not present

## 2014-06-10 LAB — POCT URINALYSIS DIPSTICK
Bilirubin, UA: NEGATIVE
Glucose, UA: NEGATIVE
Ketones, UA: NEGATIVE
Nitrite, UA: NEGATIVE
PH UA: 7
SPEC GRAV UA: 1.01
Urobilinogen, UA: NEGATIVE

## 2014-06-10 MED ORDER — CEPHALEXIN 500 MG PO CAPS: 500.0000 mg | ORAL_CAPSULE | Freq: Four times a day (QID) | ORAL | Status: DC

## 2014-06-10 NOTE — Progress Notes (Signed)
   Subjective:    Patient ID: [name], male    DOB: 1921-07-28, 80 y.o.   MRN: [id number]  HPI 80 year old here for follow-up dizziness. Has been using Flonase and maybe a little better. His chief concern today is possible infection on his left foot. The foot is red on the sole and halfway up side of the foot. He has been using some unknown cream. He is also complaining of some nocturia and wonders about infection    Review of Systems  Skin: Positive for color change and rash.  Neurological: Positive for dizziness.       Objective:   Physical Exam  HENT:  Left eardrum looks much the same very dull and there is fluid in the external auditory canal  Skin:  Lower half of the left foot is erythematous there is a small break in the skin over the heel but skin tone is normal skin is very dry. Right foot compared it does not have the same redness.          Assessment & Plan:  1. Dysuria Begin Keflex pending results of culture but Keflex of we will also cover soft tissue infection in the foot - POCT urinalysis dipstick - Urine culture  2. Soft tissue infection Use Bactroban that I think he already has along with Keflex.  [name] MD

## 2014-06-12 LAB — URINE CULTURE

## 2014-06-15 ENCOUNTER — Telehealth: Payer: Self-pay | Admitting: Family Medicine

## 2014-06-15 ENCOUNTER — Other Ambulatory Visit: Payer: Self-pay

## 2014-06-15 DIAGNOSIS — H9202 Otalgia, left ear: Secondary | ICD-10-CM

## 2014-06-15 DIAGNOSIS — L97519 Non-pressure chronic ulcer of other part of right foot with unspecified severity: Secondary | ICD-10-CM

## 2014-06-15 MED ORDER — MUPIROCIN CALCIUM 2 % EX CREA: 1.0000 "application " | TOPICAL_CREAM | Freq: Two times a day (BID) | CUTANEOUS | Status: DC

## 2014-06-15 NOTE — Telephone Encounter (Signed)
He was given Keflex for UTI as well as ear. Had suggested Flonase before but if that's not working and he still has the complaint might be better to get him to see an ENT doctor to see what they would suggest

## 2014-06-15 NOTE — Telephone Encounter (Signed)
Patient states that he is using Flonase daily and would like referral to ENT and patient states only day he can not do is 1/12

## 2014-06-17 DIAGNOSIS — H6692 Otitis media, unspecified, left ear: Secondary | ICD-10-CM | POA: Diagnosis not present

## 2014-06-17 DIAGNOSIS — H6122 Impacted cerumen, left ear: Secondary | ICD-10-CM | POA: Diagnosis not present

## 2014-06-28 DIAGNOSIS — H6062 Unspecified chronic otitis externa, left ear: Secondary | ICD-10-CM | POA: Diagnosis not present

## 2014-06-28 DIAGNOSIS — H6010 Cellulitis of external ear, unspecified ear: Secondary | ICD-10-CM | POA: Diagnosis not present

## 2014-07-07 ENCOUNTER — Telehealth: Payer: Self-pay | Admitting: Family Medicine

## 2014-07-07 DIAGNOSIS — E785 Hyperlipidemia, unspecified: Secondary | ICD-10-CM

## 2014-07-07 MED ORDER — OMEGA-3-ACID ETHYL ESTERS 1 G PO CAPS: 2.0000 g | ORAL_CAPSULE | Freq: Two times a day (BID) | ORAL | Status: DC

## 2014-07-07 NOTE — Telephone Encounter (Signed)
done

## 2014-07-13 ENCOUNTER — Other Ambulatory Visit: Payer: Self-pay

## 2014-07-13 DIAGNOSIS — F32A Depression, unspecified: Secondary | ICD-10-CM

## 2014-07-13 DIAGNOSIS — H6062 Unspecified chronic otitis externa, left ear: Secondary | ICD-10-CM | POA: Diagnosis not present

## 2014-07-13 DIAGNOSIS — F329 Major depressive disorder, single episode, unspecified: Secondary | ICD-10-CM

## 2014-07-13 DIAGNOSIS — H6122 Impacted cerumen, left ear: Secondary | ICD-10-CM | POA: Diagnosis not present

## 2014-07-13 DIAGNOSIS — H6521 Chronic serous otitis media, right ear: Secondary | ICD-10-CM | POA: Diagnosis not present

## 2014-07-13 MED ORDER — CITALOPRAM HYDROBROMIDE 20 MG PO TABS: 20.0000 mg | ORAL_TABLET | Freq: Every day | ORAL | Status: DC

## 2014-07-21 ENCOUNTER — Other Ambulatory Visit: Payer: Self-pay | Admitting: Family Medicine

## 2014-07-22 NOTE — Telephone Encounter (Signed)
Sees Dr. Sabra Heck, last filled 03/11/14, last seen 06/10/14. Call into [facility] if approved

## 2014-07-26 ENCOUNTER — Other Ambulatory Visit: Payer: Self-pay | Admitting: Internal Medicine

## 2014-07-26 ENCOUNTER — Other Ambulatory Visit: Payer: Self-pay | Admitting: Family Medicine

## 2014-07-26 NOTE — Telephone Encounter (Signed)
PATIENT NEEDS F/U APPT FOR MORE REFILLS

## 2014-07-31 DIAGNOSIS — L0390 Cellulitis, unspecified: Secondary | ICD-10-CM | POA: Diagnosis not present

## 2014-08-04 DIAGNOSIS — H6691 Otitis media, unspecified, right ear: Secondary | ICD-10-CM | POA: Diagnosis not present

## 2014-08-04 DIAGNOSIS — H6061 Unspecified chronic otitis externa, right ear: Secondary | ICD-10-CM | POA: Diagnosis not present

## 2014-08-04 DIAGNOSIS — H6121 Impacted cerumen, right ear: Secondary | ICD-10-CM | POA: Diagnosis not present

## 2014-08-11 DIAGNOSIS — H6061 Unspecified chronic otitis externa, right ear: Secondary | ICD-10-CM | POA: Diagnosis not present

## 2014-08-11 DIAGNOSIS — H61031 Chondritis of right external ear: Secondary | ICD-10-CM | POA: Diagnosis not present

## 2014-08-11 DIAGNOSIS — H6691 Otitis media, unspecified, right ear: Secondary | ICD-10-CM | POA: Diagnosis not present

## 2014-08-18 ENCOUNTER — Other Ambulatory Visit: Payer: Self-pay | Admitting: Family Medicine

## 2014-08-19 DIAGNOSIS — H61031 Chondritis of right external ear: Secondary | ICD-10-CM | POA: Diagnosis not present

## 2014-08-19 DIAGNOSIS — H6061 Unspecified chronic otitis externa, right ear: Secondary | ICD-10-CM | POA: Diagnosis not present

## 2014-08-30 DIAGNOSIS — I442 Atrioventricular block, complete: Secondary | ICD-10-CM | POA: Diagnosis not present

## 2014-09-22 ENCOUNTER — Other Ambulatory Visit: Payer: Self-pay | Admitting: Family Medicine

## 2014-10-20 ENCOUNTER — Other Ambulatory Visit: Payer: Self-pay | Admitting: Family Medicine

## 2014-10-20 NOTE — Telephone Encounter (Signed)
Last seen 06/11/15 Dr Sabra Heck  If approved route to nurse to call into [facility]

## 2014-10-21 NOTE — Telephone Encounter (Signed)
Refill called to pharmacy.

## 2014-10-23 ENCOUNTER — Other Ambulatory Visit: Payer: Self-pay | Admitting: Family Medicine

## 2014-10-25 ENCOUNTER — Telehealth: Payer: Self-pay | Admitting: Family Medicine

## 2014-10-25 DIAGNOSIS — F329 Major depressive disorder, single episode, unspecified: Secondary | ICD-10-CM

## 2014-10-25 DIAGNOSIS — F32A Depression, unspecified: Secondary | ICD-10-CM

## 2014-10-25 MED ORDER — CITALOPRAM HYDROBROMIDE 20 MG PO TABS: 20.0000 mg | ORAL_TABLET | Freq: Every day | ORAL | Status: DC

## 2014-10-25 NOTE — Telephone Encounter (Signed)
Citalopram rx refilled and patient aware he needs to be seen. Patient aware that the citalopram is for depression not cholesterol.

## 2014-11-15 ENCOUNTER — Other Ambulatory Visit: Payer: Self-pay | Admitting: Family Medicine

## 2014-11-25 ENCOUNTER — Encounter: Payer: Self-pay | Admitting: Family Medicine

## 2014-11-25 ENCOUNTER — Ambulatory Visit (INDEPENDENT_AMBULATORY_CARE_PROVIDER_SITE_OTHER): Payer: Medicare Other | Admitting: Family Medicine

## 2014-11-25 VITALS — BP 129/75 | HR 70 | Temp 97.1°F | Ht 74.0 in | Wt 218.0 lb

## 2014-11-25 DIAGNOSIS — E1122 Type 2 diabetes mellitus with diabetic chronic kidney disease: Secondary | ICD-10-CM

## 2014-11-25 DIAGNOSIS — N184 Chronic kidney disease, stage 4 (severe): Secondary | ICD-10-CM

## 2014-11-25 DIAGNOSIS — C61 Malignant neoplasm of prostate: Secondary | ICD-10-CM | POA: Diagnosis not present

## 2014-11-25 DIAGNOSIS — K5901 Slow transit constipation: Secondary | ICD-10-CM | POA: Diagnosis not present

## 2014-11-25 DIAGNOSIS — E039 Hypothyroidism, unspecified: Secondary | ICD-10-CM | POA: Diagnosis not present

## 2014-11-25 DIAGNOSIS — F32A Depression, unspecified: Secondary | ICD-10-CM

## 2014-11-25 DIAGNOSIS — E785 Hyperlipidemia, unspecified: Secondary | ICD-10-CM | POA: Diagnosis not present

## 2014-11-25 DIAGNOSIS — F329 Major depressive disorder, single episode, unspecified: Secondary | ICD-10-CM

## 2014-11-25 DIAGNOSIS — N189 Chronic kidney disease, unspecified: Secondary | ICD-10-CM | POA: Diagnosis not present

## 2014-11-25 MED ORDER — NITROGLYCERIN 0.4 MG SL SUBL: SUBLINGUAL_TABLET | SUBLINGUAL | Status: DC

## 2014-11-25 MED ORDER — POLYETHYLENE GLYCOL 3350 17 G PO PACK: 17.0000 g | PACK | Freq: Every day | ORAL | Status: DC

## 2014-11-25 MED ORDER — LEVOTHYROXINE SODIUM 200 MCG PO TABS: 200.0000 ug | ORAL_TABLET | Freq: Every day | ORAL | Status: DC

## 2014-11-25 MED ORDER — ESOMEPRAZOLE MAGNESIUM 40 MG PO CPDR: 40.0000 mg | DELAYED_RELEASE_CAPSULE | Freq: Every day | ORAL | Status: DC

## 2014-11-25 MED ORDER — CITALOPRAM HYDROBROMIDE 20 MG PO TABS: 20.0000 mg | ORAL_TABLET | Freq: Every day | ORAL | Status: DC

## 2014-11-25 NOTE — Progress Notes (Signed)
Subjective:    Patient ID: [name], male    DOB: [birth year], 81 y.o.   MRN: [id number]  HPI 81 year old gentleman who returns today to follow-up chronic problems such as hypertension, hyperlipidemia and hypothyroidism. At several of his last visits weekly and to discontinue some of the medicines. He seems at the time of visit to be on the same page as far as discontinuing cholesterol medicines of which she was on 3. At other times he requests measurement of lipids and PSA, the latter for which she was treated with radiation therapy. Has not had PSA in a while and when we discussed this I do not think he would be interested in further radiation or treatment so I encouraged him to not do PSA today and he agreed. Generally he feels pretty well for his age.  Patient Active Problem List   Diagnosis Date Noted  . Atherosclerotic PVD with ulceration 11/23/2013  . Chronic ulcer of right great toe 11/23/2013  . Depression 10/02/2013  . Anxiety state, unspecified 10/02/2013  . Foot ulcer, right 10/02/2013  . Heart burn 06/02/2013  . CAP (community acquired pneumonia) 06/02/2013  . Cough 06/02/2013  . Chronic kidney disease (CKD), stage IV (severe) 09/30/2012  . CAD (coronary artery disease) of artery bypass graft 09/30/2012  . Hypothyroidism 09/30/2012  . DM (diabetes mellitus) 09/30/2012  . Atrial fibrillation-paroxysmal 09/12/2010  . Coronary artery disease- Status post drug-eluting stent 09/12/2010  . Hyperlipidemia 02/02/2010  . AV BLOCK, COMPLETE 02/02/2010  . ORTHOSTATIC DIZZINESS 02/02/2010  . DYSPNEA ON EXERTION 02/02/2010  . Pacemaker-St. Jude 02/02/2010   Outpatient Encounter Prescriptions as of 11/25/2014  Medication Sig  . ALPRAZolam (XANAX) 0.5 MG tablet TAKE 1 TABLET AT BEDTIME AS NEEDED FOR SLEEP  . carboxymethylcellulose (REFRESH PLUS) 0.5 % SOLN 1 drop 3 (three) times daily as needed (for dry eyes).   . Chelated Potassium 99 MG TABS Take 1 tablet by mouth every other  day.  . Chelated Zinc 50 MG TABS Take 50 mg by mouth every other day.  . citalopram (CELEXA) 20 MG tablet Take 1 tablet (20 mg total) by mouth daily.  . clobetasol cream (TEMOVATE) AB-123456789 % Apply 1 application topically 2 (two) times daily as needed.  . fenofibrate 54 MG tablet Take 54 mg by mouth daily.  Marland Kitchen FREESTYLE LITE test strip USE TO TEST SUGARS ONCE DAILY  . levothyroxine (SYNTHROID, LEVOTHROID) 200 MCG tablet Take 1 tablet (200 mcg total) by mouth daily before breakfast.  . metoprolol succinate (TOPROL-XL) 50 MG 24 hr tablet TAKE 1 TABLET AT BEDTIME WITH OR IMMEDIATELY FOLLOWING A MEAL  . Multiple Vitamins-Minerals (MULTIVITAMIN,TX-MINERALS) tablet Take 1 tablet by mouth daily.    . mupirocin cream (BACTROBAN) 2 % Apply 1 application topically 2 (two) times daily.  Marland Kitchen NEXIUM 40 MG capsule TAKE 1 CAPSULE DAILY  . omega-3 acid ethyl esters (LOVAZA) 1 G capsule Take 2 capsules (2 g total) by mouth 2 (two) times daily.  Marland Kitchen OVER THE COUNTER MEDICATION Apply 1 application topically daily as needed (for scalp).  . polyethylene glycol (MIRALAX / GLYCOLAX) packet Take 17 g by mouth daily. Until having a bowel movement  . pravastatin (PRAVACHOL) 20 MG tablet Take 1 tablet (20 mg total) by mouth daily.  Alveda Reasons 20 MG TABS tablet TAKE 1 TABLET DAILY WITH SUPPER  . furosemide (LASIX) 20 MG tablet Take 1 tablet (20 mg total) by mouth every other day.  . nitroGLYCERIN (NITROSTAT) 0.4 MG SL tablet Place 1  tablet (0.4 mg total) under the tongue as needed. (Patient not taking: Reported on 11/25/2014)  . [DISCONTINUED] cephALEXin (KEFLEX) 500 MG capsule Take 1 capsule (500 mg total) by mouth 4 (four) times daily.  . [DISCONTINUED] fluticasone (FLONASE) 50 MCG/ACT nasal spray Place 2 sprays into both nostrils daily.   No facility-administered encounter medications on file as of 11/25/2014.       Review of Systems  Constitutional: Negative.   Respiratory: Negative.   Cardiovascular: Negative.     Genitourinary: Negative.   Neurological: Positive for light-headedness.  Psychiatric/Behavioral: Negative.        Objective:   Physical Exam  Constitutional: He is oriented to person, place, and time. He appears well-developed and well-nourished.  HENT:  Head: Normocephalic.  Right Ear: External ear normal.  Left Ear: External ear normal.  Cardiovascular: Normal rate.   Pulmonary/Chest: Breath sounds normal.  Neurological: He is alert and oriented to person, place, and time.  Psychiatric: He has a normal mood and affect.    BP 129/75 mmHg  Pulse 70  Temp(Src) 97.1 F (36.2 C) (Oral)  Ht 6\' 2"  (1.88 m)  Wt 218 lb (98.884 kg)  BMI 27.98 kg/m2       Assessment & Plan:  1. Hypothyroidism, unspecified hypothyroidism type Last TSH was not at goal despite 200 g of thyroid supplement - TSH  2. Hyperlipidemia I think he still is continuing to take fish oil but when he runs out of that we will stop cholesterol medicines. I think this is appropriate at 81 years old  3. Type 2 diabetes mellitus with diabetic chronic kidney disease He is on no medications but does monitor his sugar 3 times a week  4. Depression Do not think he really is depressed but he desires to continue on Celexa - citalopram (CELEXA) 20 MG tablet; Take 1 tablet (20 mg total) by mouth daily.  Dispense: 90 tablet; Refill: 2  5. Chronic kidney disease (CKD), stage IV (severe) Kidney status unchanged  6. Constipation, slow transit  - polyethylene glycol (MIRALAX / GLYCOLAX) packet; Take 17 g by mouth daily.  Dispense: 30 each; Refill: 1  7. Hypothyroidism (acquired)  - levothyroxine (SYNTHROID, LEVOTHROID) 200 MCG tablet; Take 1 tablet (200 mcg total) by mouth daily before breakfast.  Dispense: 90 tablet; Refill: 2  8. Prostate cancer See above - PSA Total+%Free (Serial)   [name] MD

## 2014-11-26 LAB — PSA TOTAL+% FREE (SERIAL)
PSA, Free Pct: >10 %
PSA, Free: 0.01 ng/mL
Prostate Specific Ag, Serum: <0.1 ng/mL (ref 0.0–4.0)

## 2014-11-26 LAB — TSH: TSH: 2.43 u[IU]/mL (ref 0.450–4.500)

## 2014-11-27 ENCOUNTER — Telehealth: Payer: Self-pay | Admitting: Family Medicine

## 2014-11-27 NOTE — Telephone Encounter (Signed)
Discussed results with patient

## 2014-11-29 DIAGNOSIS — I442 Atrioventricular block, complete: Secondary | ICD-10-CM | POA: Diagnosis not present

## 2014-12-15 ENCOUNTER — Other Ambulatory Visit: Payer: Self-pay | Admitting: Family Medicine

## 2014-12-15 NOTE — Telephone Encounter (Signed)
Last seen 11/25/14 Dr Sabra Heck last lipid 03/11/14

## 2015-01-11 ENCOUNTER — Encounter: Payer: Self-pay | Admitting: Internal Medicine

## 2015-01-11 ENCOUNTER — Ambulatory Visit (INDEPENDENT_AMBULATORY_CARE_PROVIDER_SITE_OTHER): Payer: Medicare Other | Admitting: Internal Medicine

## 2015-01-11 VITALS — BP 136/64 | HR 70 | Ht 74.0 in | Wt 220.0 lb

## 2015-01-11 DIAGNOSIS — I442 Atrioventricular block, complete: Secondary | ICD-10-CM

## 2015-01-11 DIAGNOSIS — Z45018 Encounter for adjustment and management of other part of cardiac pacemaker: Secondary | ICD-10-CM

## 2015-01-11 DIAGNOSIS — Z950 Presence of cardiac pacemaker: Secondary | ICD-10-CM | POA: Diagnosis not present

## 2015-01-11 DIAGNOSIS — I480 Paroxysmal atrial fibrillation: Secondary | ICD-10-CM | POA: Diagnosis not present

## 2015-01-11 NOTE — Progress Notes (Signed)
Patient Care Team: [name], MD as PCP - General (Family Medicine)   HPI  [name] is a 81 y.o. male seen in followup for a pacemaker implanted for CHB 2009.   He also has CAD with a chronically occluded right coronary artery. He had an LAD with 90% stenosis at the take off of the first diagonal. He had a drug-eluting stent placed by Dr. Burt Knack 2009  Myoview scan done 2013 was negative for ischemia;  LV function 60%   He also struggled with orthostatic intolerance.  He has significant shortness of breath with minimal activity.    Renal function and a GFR 30 09/14/2013  Past Medical History  Diagnosis Date  . Cancer     prostate ca with radiation in 2005  . Hypertension   . Cellulitis of face feb 2011    prostetic rt eye, cellulitis of rt side of face  . CAD (coronary artery disease)     (non Q wave MI in Jan 2009. He had chronically ocluded right coronary artery. He had a LAD w/90% stenosis at the take off of the first diagona. He had a drug-eluding stent palced by Dr Burt Knack)  . Complete heart block     Zephyr dual chamber pacemaker per Dr Caryl Comes  . Dyslipidemia   . Hypothyroid   . Macular degeneration   . Blindness of right eye     Related to trauma in [place]  . Diabetes mellitus without complication     Past Surgical History  Procedure Laterality Date  . Eye removed Right 1997    Current Outpatient Prescriptions  Medication Sig Dispense Refill  . ALPRAZolam (XANAX) 0.5 MG tablet TAKE 1 TABLET AT BEDTIME AS NEEDED FOR SLEEP 90 tablet 0  . carboxymethylcellulose (REFRESH PLUS) 0.5 % SOLN 1 drop 3 (three) times daily as needed (for dry eyes).     . Chelated Potassium 99 MG TABS Take 1 tablet by mouth every other day.    . Chelated Zinc 50 MG TABS Take 50 mg by mouth every other day.    . citalopram (CELEXA) 20 MG tablet Take 1 tablet (20 mg total) by mouth daily. 90 tablet 2  . clobetasol cream (TEMOVATE) AB-123456789 % Apply 1 application topically 2 (two) times  daily as needed. 60 g 1  . esomeprazole (NEXIUM) 40 MG capsule Take 1 capsule (40 mg total) by mouth daily. 90 capsule 2  . FREESTYLE LITE test strip USE TO TEST SUGARS ONCE DAILY 100 each 2  . furosemide (LASIX) 20 MG tablet Take 1 tablet (20 mg total) by mouth every other day. 45 tablet 1  . levothyroxine (SYNTHROID, LEVOTHROID) 200 MCG tablet Take 1 tablet (200 mcg total) by mouth daily before breakfast. 90 tablet 2  . metoprolol succinate (TOPROL-XL) 50 MG 24 hr tablet TAKE 1 TABLET AT BEDTIME WITH OR IMMEDIATELY FOLLOWING A MEAL 30 tablet 0  . Multiple Vitamins-Minerals (MULTIVITAMIN,TX-MINERALS) tablet Take 1 tablet by mouth daily.      . mupirocin cream (BACTROBAN) 2 % Apply 1 application topically 2 (two) times daily. 30 g 2  . nitroGLYCERIN (NITROSTAT) 0.4 MG SL tablet Every 5 minutes as needed (max of 3 doses) 30 tablet 2  . omega-3 acid ethyl esters (LOVAZA) 1 G capsule TAKE 2 CAPSULES TWICE A DAY 360 capsule 2  . OVER THE COUNTER MEDICATION Apply 1 application topically daily as needed (for scalp).    . polyethylene glycol (MIRALAX / GLYCOLAX) packet Take 17 g by  mouth daily. 30 each 1  . XARELTO 20 MG TABS tablet TAKE 1 TABLET DAILY WITH SUPPER 90 tablet 1   No current facility-administered medications for this visit.    No Known Allergies  Review of Systems negative except from HPI and PMH  Physical Exam Ht 6\' 2"  (1.88 m) Well developed and well nourished in no acute distress HENT normal E scleral and icterus clear Neck Supple JVP flat; carotids brisk and full Clear to ausculation regular rate and rhythm Soft with active bowel sounds No clubbing cyanosis 2+Edema Alert and oriented, wide-based and unstable gait Skin Warm and Dry    Assessment and  Plan  Congestive heart failure-chronic diastolic  Atrial fibrillation-persistent  Thrombolic risk factors hypertension-1 age-42 vascular disease-1  Pacemaker-St. Jude    He was currently incompetent and  reprogramming of his device [id number] was associated with a significant improvement in exercise performance

## 2015-01-11 NOTE — Patient Instructions (Signed)
Medication Instructions:  Your physician recommends that you continue on your current medications as directed. Please refer to the Current Medication list given to you today.  Labwork: None ordered  Testing/Procedures: None ordered  Follow-Up: Your physician wants you to follow-up in: 6 months with Dr. Caryl Comes. You will receive a reminder letter in the mail two months in advance. If you don't receive a letter, please call our office to schedule the follow-up appointment.  Your physician wants you to follow-up in: 1 year with Dr. Caryl Comes.  You will receive a reminder letter in the mail two months in advance. If you don't receive a letter, please call our office to schedule the follow-up appointment.  Any Other Special Instructions Will Be Listed Below (If Applicable). Thank you for choosing [facility]!!

## 2015-01-12 ENCOUNTER — Encounter: Payer: Self-pay | Admitting: Internal Medicine

## 2015-01-12 ENCOUNTER — Other Ambulatory Visit: Payer: Self-pay | Admitting: Family Medicine

## 2015-01-12 LAB — CUP PACEART INCLINIC DEVICE CHECK
Brady Statistic RA Percent Paced: 83 %
Date Time Interrogation Session: 20160802201923
Lead Channel Impedance Value: 423 Ohm
Lead Channel Pacing Threshold Pulse Width: 0.4 ms
Lead Channel Setting Pacing Pulse Width: 0.4 ms
Lead Channel Setting Sensing Sensitivity: 2 mV
MDC IDC MSMT BATTERY IMPEDANCE: 3200 Ohm
MDC IDC MSMT BATTERY VOLTAGE: 2.73 V
MDC IDC MSMT LEADCHNL RV PACING THRESHOLD AMPLITUDE: 0.875 V
MDC IDC PG SERIAL: 2010227
MDC IDC STAT BRADY RV PERCENT PACED: 100 %
Pulse Gen Model: 5826

## 2015-01-12 NOTE — Telephone Encounter (Signed)
[facility] pt, last filled 10/21/14, last seen 11/25/14. Call into [facility]

## 2015-01-22 ENCOUNTER — Other Ambulatory Visit: Payer: Self-pay | Admitting: Adult Health

## 2015-02-28 DIAGNOSIS — I442 Atrioventricular block, complete: Secondary | ICD-10-CM | POA: Diagnosis not present

## 2015-04-02 ENCOUNTER — Emergency Department (HOSPITAL_COMMUNITY): Payer: Medicare Other

## 2015-04-02 ENCOUNTER — Emergency Department (HOSPITAL_COMMUNITY)
Admission: EM | Admit: 2015-04-02 | Discharge: 2015-04-02 | Disposition: A | Discharge status: Home / Self Care | Payer: Medicare Other | Attending: Emergency Medicine | Admitting: Emergency Medicine

## 2015-04-02 ENCOUNTER — Encounter (HOSPITAL_COMMUNITY): Payer: Self-pay | Admitting: Emergency Medicine

## 2015-04-02 DIAGNOSIS — I10 Essential (primary) hypertension: Secondary | ICD-10-CM | POA: Diagnosis not present

## 2015-04-02 DIAGNOSIS — Z87891 Personal history of nicotine dependence: Secondary | ICD-10-CM | POA: Insufficient documentation

## 2015-04-02 DIAGNOSIS — Z7901 Long term (current) use of anticoagulants: Secondary | ICD-10-CM | POA: Insufficient documentation

## 2015-04-02 DIAGNOSIS — R404 Transient alteration of awareness: Secondary | ICD-10-CM | POA: Diagnosis not present

## 2015-04-02 DIAGNOSIS — Z8546 Personal history of malignant neoplasm of prostate: Secondary | ICD-10-CM | POA: Diagnosis not present

## 2015-04-02 DIAGNOSIS — E119 Type 2 diabetes mellitus without complications: Secondary | ICD-10-CM | POA: Insufficient documentation

## 2015-04-02 DIAGNOSIS — I252 Old myocardial infarction: Secondary | ICD-10-CM | POA: Insufficient documentation

## 2015-04-02 DIAGNOSIS — Z792 Long term (current) use of antibiotics: Secondary | ICD-10-CM | POA: Diagnosis not present

## 2015-04-02 DIAGNOSIS — Z79899 Other long term (current) drug therapy: Secondary | ICD-10-CM | POA: Insufficient documentation

## 2015-04-02 DIAGNOSIS — R350 Frequency of micturition: Secondary | ICD-10-CM | POA: Diagnosis not present

## 2015-04-02 DIAGNOSIS — I2510 Atherosclerotic heart disease of native coronary artery without angina pectoris: Secondary | ICD-10-CM | POA: Diagnosis not present

## 2015-04-02 DIAGNOSIS — R42 Dizziness and giddiness: Secondary | ICD-10-CM | POA: Insufficient documentation

## 2015-04-02 DIAGNOSIS — Z872 Personal history of diseases of the skin and subcutaneous tissue: Secondary | ICD-10-CM | POA: Diagnosis not present

## 2015-04-02 DIAGNOSIS — H5441 Blindness, right eye, normal vision left eye: Secondary | ICD-10-CM | POA: Insufficient documentation

## 2015-04-02 DIAGNOSIS — E039 Hypothyroidism, unspecified: Secondary | ICD-10-CM | POA: Diagnosis not present

## 2015-04-02 DIAGNOSIS — J40 Bronchitis, not specified as acute or chronic: Secondary | ICD-10-CM | POA: Insufficient documentation

## 2015-04-02 DIAGNOSIS — F17210 Nicotine dependence, cigarettes, uncomplicated: Secondary | ICD-10-CM | POA: Diagnosis not present

## 2015-04-02 LAB — CBC WITH DIFFERENTIAL/PLATELET
BASOS PCT: 1 %
Basophils Absolute: 0 10*3/uL (ref 0.0–0.1)
EOS ABS: 0.1 10*3/uL (ref 0.0–0.7)
EOS PCT: 2 %
HCT: 39.4 % (ref 39.0–52.0)
HEMOGLOBIN: 13.1 g/dL (ref 13.0–17.0)
LYMPHS PCT: 17 %
Lymphs Abs: 1.3 10*3/uL (ref 0.7–4.0)
MCH: 30.3 pg (ref 26.0–34.0)
MCHC: 33.2 g/dL (ref 30.0–36.0)
MCV: 91 fL (ref 78.0–100.0)
MONO ABS: 0.9 10*3/uL (ref 0.1–1.0)
MONOS PCT: 12 %
NEUTROS ABS: 5.5 10*3/uL (ref 1.7–7.7)
NEUTROS PCT: 68 %
PLATELETS: 229 10*3/uL (ref 150–400)
RBC: 4.33 MIL/uL (ref 4.22–5.81)
RDW: 13.3 % (ref 11.5–15.5)
WBC: 7.9 10*3/uL (ref 4.0–10.5)

## 2015-04-02 LAB — COMPREHENSIVE METABOLIC PANEL
ALBUMIN: 3.4 g/dL — AB (ref 3.5–5.0)
ALT: 20 U/L (ref 17–63)
ANION GAP: 8 (ref 5–15)
AST: 26 U/L (ref 15–41)
Alkaline Phosphatase: 47 U/L (ref 38–126)
BUN: 18 mg/dL (ref 6–20)
CHLORIDE: 101 mmol/L (ref 101–111)
CO2: 26 mmol/L (ref 22–32)
Calcium: 8.7 mg/dL — ABNORMAL LOW (ref 8.9–10.3)
Creatinine, Ser: 1.24 mg/dL (ref 0.61–1.24)
GFR calc Af Amer: 57 mL/min — ABNORMAL LOW (ref >60)
GFR calc non Af Amer: 49 mL/min — ABNORMAL LOW (ref >60)
GLUCOSE: 102 mg/dL — AB (ref 65–99)
POTASSIUM: 3.6 mmol/L (ref 3.5–5.1)
SODIUM: 135 mmol/L (ref 135–145)
Total Bilirubin: 1.1 mg/dL (ref 0.3–1.2)
Total Protein: 7.7 g/dL (ref 6.5–8.1)

## 2015-04-02 LAB — URINALYSIS, ROUTINE W REFLEX MICROSCOPIC
BILIRUBIN URINE: NEGATIVE
GLUCOSE, UA: NEGATIVE mg/dL
KETONES UR: NEGATIVE mg/dL
Leukocytes, UA: NEGATIVE
NITRITE: NEGATIVE
PH: 7 (ref 5.0–8.0)
Protein, ur: NEGATIVE mg/dL
Specific Gravity, Urine: 1.016 (ref 1.005–1.030)
UROBILINOGEN UA: 1 mg/dL (ref 0.0–1.0)

## 2015-04-02 LAB — URINE MICROSCOPIC-ADD ON

## 2015-04-02 LAB — LIPASE, BLOOD: Lipase: 25 U/L (ref 11–51)

## 2015-04-02 MED ORDER — ALBUTEROL SULFATE HFA 108 (90 BASE) MCG/ACT IN AERS
2.0000 | INHALATION_SPRAY | Freq: Once | RESPIRATORY_TRACT | Status: AC
  Administered 2015-04-02: 2 via RESPIRATORY_TRACT
  Filled 2015-04-02: qty 6.7

## 2015-04-02 MED ORDER — GUAIFENESIN-CODEINE 100-10 MG/5ML PO SOLN: 10.0000 mL | Freq: Four times a day (QID) | ORAL | Status: DC | PRN

## 2015-04-02 MED ORDER — AEROCHAMBER Z-STAT PLUS/MEDIUM MISC
1.0000 | Freq: Once | Status: AC
  Administered 2015-04-02: 1
  Filled 2015-04-02: qty 1

## 2015-04-02 NOTE — Discharge Instructions (Signed)
Take your medications as prescribed. Follow up with your primary care provider in 3-4 days. Please return to the Emergency Department if symptoms worsen or new onset of fever, chills, visual changes, facial asymmetry, change in mental status, wheezing, difficulty breathing, chest pain, numbness, tingling, weakness.

## 2015-04-02 NOTE — ED Notes (Signed)
Delay in lab draw,  Pt changing into gown

## 2015-04-02 NOTE — ED Notes (Signed)
Bed: [id number] Expected date: 04/02/15 Expected time: 12:19 PM Means of arrival: Ambulance Comments: 81 yo dizziness

## 2015-04-02 NOTE — ED Notes (Signed)
Patient here from home with complaints of dizziness that started last night worse this morning. Denies n/v/d. Ambulatory.

## 2015-04-02 NOTE — ED Provider Notes (Signed)
CSN: [id number]     Arrival date & time 04/02/15  1221 History   First MD Initiated Contact with Patient 04/02/15 1222     Chief Complaint  Patient presents with  . Dizziness     (Consider location/radiation/quality/duration/timing/severity/associated sxs/prior Treatment) HPI Comments: Patient is a 81 year old male with past medical history of hypertension, CAD, pacemaker for complete heart block, diabetes, prostate cancer (radiation done in 2005), HLD who presents to the ED via EMS with complaint of dizziness. Patient reports having sensation of "lightheadedness" intermittently for the past few years. He notes the lightheadedness worsened from sitting to standing. Denies room spinning. He states he was told that he might have had a TIA at onset of lightheadedness over a year ago however he was unable to have an MRI due to his pacemaker. Patient notes that his lightheadedness began to worsen last night and notes that when he woke up this morning he was unable to get out of bed. He also reports having urinary frequency and notes he has had a cough this week. Denies fever, chills, headache, visual changes, ear pain, sore throat, SOB, wheezing, CP, palpitations, abdominal pain, N/V/D, constipation, dysuria, blood in urine or stool, numbness, tingling, weakness, syncope, seizure. Patient's daughter reports that he had an unwitnessed fall earlier this week, however patient reports he only slipped off the toilet and denies LOC or head injury. Pt is on Xarelto.    Past Medical History  Diagnosis Date  . Cancer [facility])     prostate ca with radiation in 2005  . Hypertension   . Cellulitis of face feb 2011    prostetic rt eye, cellulitis of rt side of face  . CAD (coronary artery disease)     (non Q wave MI in Jan 2009. He had chronically ocluded right coronary artery. He had a LAD w/90% stenosis at the take off of the first diagona. He had a drug-eluding stent palced by Dr Burt Knack)  . Complete heart block  (HCC)     Zephyr dual chamber pacemaker per Dr Caryl Comes  . Dyslipidemia   . Hypothyroid   . Macular degeneration   . Blindness of right eye     Related to trauma in [place]  . Diabetes mellitus without complication [facility])    Past Surgical History  Procedure Laterality Date  . Eye removed Right 1997   History reviewed. No pertinent family history. Social History  Substance Use Topics  . Smoking status: Former Smoker -- 2.00 packs/day for 65 years    Types: Cigarettes    Quit date: 09/11/2000  . Smokeless tobacco: Never Used  . Alcohol Use: 0.6 oz/week    1 Cans of beer per week     Comment: 1 beer daily    Review of Systems  Respiratory: Positive for cough.   Genitourinary: Positive for frequency.  Neurological: Positive for light-headedness.  All other systems reviewed and are negative.     Allergies  Review of patient's allergies indicates no known allergies.  Home Medications   Prior to Admission medications   Medication Sig Start Date End Date Taking? Authorizing Provider  ALPRAZolam Duanne Moron) 0.5 MG tablet TAKE 1 TABLET AT BEDTIME AS NEEDED FOR SLEEP 01/12/15  Yes Chipper Herb, MD  clobetasol cream (TEMOVATE) AB-123456789 % Apply 1 application topically 2 (two) times daily as needed. 10/02/13  Yes Vernie Shanks, MD  esomeprazole (NEXIUM) 40 MG capsule Take 1 capsule (40 mg total) by mouth daily. 11/25/14  Yes [name], MD  levothyroxine (SYNTHROID, LEVOTHROID) 200 MCG tablet Take 1 tablet (200 mcg total) by mouth daily before breakfast. 11/25/14  Yes [name], MD  mupirocin cream (BACTROBAN) 2 % Apply 1 application topically 2 (two) times daily. 06/15/14  Yes [name], MD  nitroGLYCERIN (NITROSTAT) 0.4 MG SL tablet Every 5 minutes as needed (max of 3 doses) 11/25/14  Yes [name], MD  omega-3 acid ethyl esters (LOVAZA) 1 G capsule TAKE 2 CAPSULES TWICE A DAY 12/15/14  Yes [name], MD  polyethylene glycol [facility] / GLYCOLAX) packet Take 17 g by  mouth daily. 11/25/14  Yes [name], MD  XARELTO 20 MG TABS tablet TAKE 1 TABLET DAILY WITH SUPPER Patient taking differently: TAKE 1 TABLET every other day 01/24/15  Yes Deboraha Sprang, MD  citalopram (CELEXA) 20 MG tablet Take 20 mg by mouth daily.  03/24/15   Historical Provider, MD  FREESTYLE LITE test strip USE TO TEST SUGARS ONCE DAILY    Vernie Shanks, MD  guaiFENesin-codeine 100-10 MG/5ML syrup Take 10 mLs by mouth every 6 (six) hours as needed for cough. 04/02/15   Chesley Noon Nadeau, PA-C   BP 152/82 mmHg  Pulse 70  Temp(Src) 98.1 F (36.7 C) (Oral)  Resp 16  SpO2 97% Physical Exam  Constitutional: He is oriented to person, place, and time. He appears well-developed and well-nourished. No distress.  HENT:  Head: Normocephalic and atraumatic.  Right Ear: Tympanic membrane normal.  Left Ear: Tympanic membrane normal.  Nose: Nose normal. Right sinus exhibits no maxillary sinus tenderness and no frontal sinus tenderness. Left sinus exhibits no maxillary sinus tenderness and no frontal sinus tenderness.  Mouth/Throat: Uvula is midline, oropharynx is clear and moist and mucous membranes are normal. No oropharyngeal exudate.  Eyes: Conjunctivae and EOM are normal. Pupils are equal, round, and reactive to light. Right eye exhibits no discharge. Left eye exhibits no discharge. No scleral icterus.  Pt does not have right eye.  Neck: Normal range of motion. Neck supple.  Cardiovascular: Normal rate, regular rhythm, normal heart sounds and intact distal pulses.   No murmur heard. Pulmonary/Chest: Effort normal. No respiratory distress. He has wheezes (RLL). He has no rales. He exhibits no tenderness.  Abdominal: Soft. Bowel sounds are normal. He exhibits no distension and no mass. There is no tenderness. There is no rebound and no guarding.  Musculoskeletal: Normal range of motion. He exhibits no edema or tenderness.  Lymphadenopathy:    He has no cervical adenopathy.   Neurological: He is alert and oriented to person, place, and time. He has normal strength. No cranial nerve deficit or sensory deficit. He displays a negative Romberg sign. Coordination normal.  Skin: Skin is warm and dry. He is not diaphoretic.  Nursing note and vitals reviewed.   ED Course  Procedures (including critical care time) Labs Review Labs Reviewed  COMPREHENSIVE METABOLIC PANEL - Abnormal; Notable for the following:    Glucose, Bld 102 (*)    Calcium 8.7 (*)    Albumin 3.4 (*)    GFR calc non Af Amer 49 (*)    GFR calc Af Amer 57 (*)    All other components within normal limits  URINALYSIS, ROUTINE W REFLEX MICROSCOPIC (NOT AT [facility]) - Abnormal; Notable for the following:    Hgb urine dipstick MODERATE (*)    All other components within normal limits  CBC WITH DIFFERENTIAL/PLATELET  LIPASE, BLOOD  URINE MICROSCOPIC-ADD ON    Imaging Review  Dg Chest 2 View  04/02/2015  CLINICAL DATA:  Dizziness beginning last night, coronary disease, hypertension, diabetes mellitus, former smoker EXAM: CHEST  2 VIEW COMPARISON:  06/02/2013 FINDINGS: LEFT subclavian transvenous pacemaker leads project over RIGHT atrium and RIGHT ventricle. Enlargement of cardiac silhouette with slight pulmonary vascular congestion. Atherosclerotic calcification aorta. Lungs mildly hyperinflated but clear. No pleural effusion or pneumothorax. Bones demineralized. IMPRESSION: Enlargement of cardiac silhouette with slight vascular congestion post pacemaker. No acute abnormalities. Electronically Signed   By: Lavonia Dana M.D.   On: 04/02/2015 14:31   Ct Head Wo Contrast  04/02/2015  CLINICAL DATA:  Dizziness. EXAM: CT HEAD WITHOUT CONTRAST TECHNIQUE: Contiguous axial images were obtained from the base of the skull through the vertex without intravenous contrast. COMPARISON:  CT scan of August 28, 2012. FINDINGS: Moderate diffuse cortical atrophy is noted. Mild chronic ischemic white matter disease is noted. No  mass effect or midline shift is noted. Ventricular size is within normal limits. There is no evidence of mass lesion, hemorrhage or acute infarction. IMPRESSION: Moderate diffuse cortical atrophy. Mild chronic ischemic white matter disease. No acute intracranial abnormality seen. Electronically Signed   By: Marijo Conception, M.D.   On: 04/02/2015 14:20   I have personally reviewed and evaluated these images and lab results as part of my medical decision-making.   EKG Interpretation   Date/Time:  Saturday April 02 2015 12:37:57 EDT Ventricular Rate:  70 PR Interval:  178 QRS Duration: 143 QT Interval:  456 QTC Calculation: 492 R Axis:   -59 Text Interpretation:  Ventricular-paced rhythm No further analysis  attempted due to paced rhythm agree. Confirmed by Johnney Killian, MD, Jeannie Done  [phone]) on 04/02/2015 1:48:09 PM     Filed Vitals:   04/02/15 1448  BP: 152/82  Pulse: 70  Temp:   Resp: 16     MDM   Final diagnoses:  Dizziness  Bronchitis    Patient presents with worsening lightheadedness. Reports history of lightheadedness for the past year, ?TIA, unable to obtain MRI due to pacemaker. Also reports having a cough this week, no fever , no SOB, no wheezing, no CP. Patient's daughter states she has had a cold this week with similar symptoms. Daughter reports patient had a unwitnessed fall earlier this week, patient denies head injury or LOC. Patient is on Xarelto.  Patient is ambulatory at home with a cane. VSS. Exam revealed mild wheezing in RLL. No neuro deficits.   Labs and urine unremarkable. EKG showed ventricular-paced rhythm, unchanged from prior. CXR negative. CT head showed no acute intracranial abnormalities. Orthostatics negative. Patient able to stand and take a few steps without assistance, no ataxia. He endorses mild lightheadedness while ambulating. I do not feel that any further workup or imaging is warranted at this time. I suspect cough and wheezing on exam is likely due  to bronchitis. Plan to d/c pt home with albuterol inhaler with spacer and guaifenesin-codeine. Pt advised to follow up with his PCP in 3-4 days.   Evaluation does not show pathology requring ongoing emergent intervention or admission. Pt is hemodynamically stable and mentating appropriately. Discussed findings/results and plan with patient/guardian, who agrees with plan. All questions answered. Return precautions discussed and outpatient follow up given.     Chesley Noon [place], [place] 04/02/15 1609  Charlesetta Shanks, MD 04/11/15 [phone]

## 2015-04-04 ENCOUNTER — Telehealth (HOSPITAL_BASED_OUTPATIENT_CLINIC_OR_DEPARTMENT_OTHER): Payer: Self-pay | Admitting: Emergency Medicine

## 2015-04-11 ENCOUNTER — Other Ambulatory Visit: Payer: Self-pay | Admitting: *Deleted

## 2015-04-11 ENCOUNTER — Ambulatory Visit: Payer: Medicare Other | Admitting: Family Medicine

## 2015-04-11 ENCOUNTER — Telehealth: Payer: Self-pay | Admitting: Pediatrics

## 2015-04-11 ENCOUNTER — Encounter: Payer: Self-pay | Admitting: Pediatrics

## 2015-04-11 ENCOUNTER — Ambulatory Visit (INDEPENDENT_AMBULATORY_CARE_PROVIDER_SITE_OTHER): Payer: Medicare Other | Admitting: Pediatrics

## 2015-04-11 VITALS — BP 134/75 | HR 69 | Temp 97.3°F | Ht 74.0 in

## 2015-04-11 DIAGNOSIS — R29898 Other symptoms and signs involving the musculoskeletal system: Secondary | ICD-10-CM | POA: Diagnosis not present

## 2015-04-11 DIAGNOSIS — I480 Paroxysmal atrial fibrillation: Secondary | ICD-10-CM

## 2015-04-11 DIAGNOSIS — J209 Acute bronchitis, unspecified: Secondary | ICD-10-CM | POA: Diagnosis not present

## 2015-04-11 MED ORDER — AZITHROMYCIN 250 MG PO TABS: ORAL_TABLET | ORAL | Status: DC

## 2015-04-11 NOTE — Addendum Note (Signed)
Addended by: Eustaquio Maize on: 04/11/2015 08:53 PM   Modules accepted: Level of Service

## 2015-04-11 NOTE — Progress Notes (Addendum)
Subjective:    Patient ID: [name], male    DOB: 08/17/1921, 81 y.o.   MRN: [id number]  CC: hospital f/u  HPI: [name] is a 81 y.o. male presenting on 04/11/2015 for Hospitalization Follow-up  Went to ED one week ago because couldn't walk. He got a URI 2 weeks ago while they were at the beach. No runny nose, lots of coughing, some congestion. Called the ambulance 1 week ago to take him to the ED because he was so dizzy and weak. He has some dizziness and weaknes that has been going on for 2 year, he felt was a little bit worse on Saturday. Still feels "woozyheaded" today, says he has seen 15 doctors to try to figure out why, has been going on for 2 years. No appetite, has been ongoing for past few months, worse recently. No vision R eye, decreased vision L eye due to macular degeneration. Lives alone, has an aid with in 3 days a week Daughter lives nearby, she helps get food for him on days he doesn't have an aid He "uses furniture" to get from living room, bed room to bathroom.  He denies recent falls. He still is coughing now, still with congestion.  Relevant past medical, surgical, family and social history reviewed and updated as indicated. Interim medical history since our last visit reviewed. Allergies and medications reviewed and updated.   ROS: Per HPI unless specifically indicated above  Past Medical History Patient Active Problem List   Diagnosis Date Noted  . Atherosclerotic PVD with ulceration ([place]) 11/23/2013  . Chronic ulcer of right great toe ([place]) 11/23/2013  . Depression 10/02/2013  . Anxiety state, unspecified 10/02/2013  . Foot ulcer, right ([place]) 10/02/2013  . Heart burn 06/02/2013  . CAP (community acquired pneumonia) 06/02/2013  . Cough 06/02/2013  . Chronic kidney disease (CKD), stage IV (severe) ([place]) 09/30/2012  . CAD (coronary artery disease) of artery bypass graft 09/30/2012  . Hypothyroidism 09/30/2012  . DM (diabetes mellitus) ([place])  09/30/2012  . Atrial fibrillation-paroxysmal 09/12/2010  . Coronary artery disease- Status post drug-eluting stent 09/12/2010  . Hyperlipidemia 02/02/2010  . AV BLOCK, COMPLETE 02/02/2010  . ORTHOSTATIC DIZZINESS 02/02/2010  . DYSPNEA ON EXERTION 02/02/2010  . Pacemaker-St. Jude 02/02/2010    Current Outpatient Prescriptions  Medication Sig Dispense Refill  . ALPRAZolam (XANAX) 0.5 MG tablet TAKE 1 TABLET AT BEDTIME AS NEEDED FOR SLEEP 90 tablet 0  . esomeprazole (NEXIUM) 40 MG capsule Take 1 capsule (40 mg total) by mouth daily. 90 capsule 2  . FREESTYLE LITE test strip USE TO TEST SUGARS ONCE DAILY 100 each 2  . guaiFENesin-codeine 100-10 MG/5ML syrup Take 10 mLs by mouth every 6 (six) hours as needed for cough. 120 mL 0  . levothyroxine (SYNTHROID, LEVOTHROID) 200 MCG tablet Take 1 tablet (200 mcg total) by mouth daily before breakfast. 90 tablet 2  . mupirocin cream (BACTROBAN) 2 % Apply 1 application topically 2 (two) times daily. 30 g 2  . omega-3 acid ethyl esters (LOVAZA) 1 G capsule TAKE 2 CAPSULES TWICE A DAY 360 capsule 2  . polyethylene glycol (MIRALAX / GLYCOLAX) packet Take 17 g by mouth daily. 30 each 1  . XARELTO 20 MG TABS tablet TAKE 1 TABLET DAILY WITH SUPPER 90 tablet 3  . azithromycin (ZITHROMAX Z-PAK) 250 MG tablet Take 2 the first day and then one each day after. 1 each 0  . nitroGLYCERIN (NITROSTAT) 0.4 MG SL tablet Every 5  minutes as needed (max of 3 doses) (Patient not taking: Reported on 04/11/2015) 30 tablet 2   No current facility-administered medications for this visit.       Objective:    BP 134/75 mmHg  Pulse 69  Temp(Src) 97.3 F (36.3 C) (Oral)  Ht 6\' 2"  (1.88 m)  SpO2 96%  Wt Readings from Last 3 Encounters:  01/11/15 220 lb (99.791 kg)  11/25/14 218 lb (98.884 kg)  06/10/14 217 lb 6.4 oz (98.612 kg)    Gen: NAD, alert, cooperative with exam, NCAT, in a wheelchair EYES: no scleral icterus ENT:  TMs pearly gray b/l, OP without  erythema LYMPH: no cervical LAD CV: NRRR, normal S1/S2, no murmur, distal pulses 2+ b/l Resp: CTABL, no wheezes, normal WOB Abd: +BS, soft, NTND. no guarding or organomegaly Ext: No edema, warm Neuro: Alert and oriented, strength equal b/l LE with ext/flex at knee MSK: normal muscle bulk     Assessment & Plan:   [name] was seen today for hospitalization follow-up. I have reviewed his hospital records including progress notes, labs, imaging. He had a CT of his head that was negative. Due to his pacemaker he cannot have an MRI but possibility of a stroke has been raised in the past. He has no focal weakness of neurological symptoms now. Legs generally feel "weak" after spending more time resting and in bed since he has been sick. Pt not able to leave house on his own due to requiring assist devices, decreased vision. Will refer for home health PT to do home safety assessment and gait evaluation as weakness in legs is new. Sometimes uses walker at home, often "holds onto furniture" to get around. Will treat congestion ongoing over two weeks with azithromycin. Discussed minimizing xanax, only using when he really needs it for sleep, and stopping codeine that was recently prescribed for cough as both medicines can increase weakness and risk of falls in pt who lives alone, has decreased vision, is on xarelto for afib and has some gait instability per his report.    Diagnoses and all orders for this visit:  Acute bronchitis, unspecified organism -     azithromycin (ZITHROMAX Z-PAK) 250 MG tablet; Take 2 the first day and then one each day after.  Weakness of both legs -     Ambulatory referral to Home Health   Follow up plan: Return in about 2 weeks (around 04/25/2015) for follow up with Dr. Miller/PCP.  Assunta Found, MD [facility] 04/11/2015, 11:49 AM

## 2015-04-11 NOTE — Telephone Encounter (Signed)
Rx for test strips called into [facility] per Dr Evette Doffing

## 2015-04-17 DIAGNOSIS — M6281 Muscle weakness (generalized): Secondary | ICD-10-CM | POA: Diagnosis not present

## 2015-04-17 DIAGNOSIS — R269 Unspecified abnormalities of gait and mobility: Secondary | ICD-10-CM | POA: Diagnosis not present

## 2015-04-20 DIAGNOSIS — R269 Unspecified abnormalities of gait and mobility: Secondary | ICD-10-CM | POA: Diagnosis not present

## 2015-04-20 DIAGNOSIS — M6281 Muscle weakness (generalized): Secondary | ICD-10-CM | POA: Diagnosis not present

## 2015-04-22 ENCOUNTER — Other Ambulatory Visit: Payer: Self-pay | Admitting: Family Medicine

## 2015-04-22 DIAGNOSIS — M6281 Muscle weakness (generalized): Secondary | ICD-10-CM | POA: Diagnosis not present

## 2015-04-22 DIAGNOSIS — R269 Unspecified abnormalities of gait and mobility: Secondary | ICD-10-CM | POA: Diagnosis not present

## 2015-04-25 NOTE — Telephone Encounter (Signed)
Pt takes once a day at night for sleep.

## 2015-04-25 NOTE — Telephone Encounter (Signed)
Last seen 04/11/15  Dr Evette Doffing   If approved route to nurse to call into [facility]

## 2015-04-25 NOTE — Telephone Encounter (Signed)
Refill called to [facility]

## 2015-04-26 DIAGNOSIS — R269 Unspecified abnormalities of gait and mobility: Secondary | ICD-10-CM | POA: Diagnosis not present

## 2015-04-26 DIAGNOSIS — M6281 Muscle weakness (generalized): Secondary | ICD-10-CM | POA: Diagnosis not present

## 2015-04-29 DIAGNOSIS — R269 Unspecified abnormalities of gait and mobility: Secondary | ICD-10-CM | POA: Diagnosis not present

## 2015-04-29 DIAGNOSIS — M6281 Muscle weakness (generalized): Secondary | ICD-10-CM | POA: Diagnosis not present

## 2015-05-03 DIAGNOSIS — R269 Unspecified abnormalities of gait and mobility: Secondary | ICD-10-CM | POA: Diagnosis not present

## 2015-05-03 DIAGNOSIS — M6281 Muscle weakness (generalized): Secondary | ICD-10-CM | POA: Diagnosis not present

## 2015-05-06 DIAGNOSIS — M6281 Muscle weakness (generalized): Secondary | ICD-10-CM | POA: Diagnosis not present

## 2015-05-06 DIAGNOSIS — R269 Unspecified abnormalities of gait and mobility: Secondary | ICD-10-CM | POA: Diagnosis not present

## 2015-05-11 ENCOUNTER — Ambulatory Visit (INDEPENDENT_AMBULATORY_CARE_PROVIDER_SITE_OTHER): Payer: Medicare Other | Admitting: Family Medicine

## 2015-05-11 DIAGNOSIS — M6281 Muscle weakness (generalized): Secondary | ICD-10-CM | POA: Diagnosis not present

## 2015-05-11 DIAGNOSIS — R269 Unspecified abnormalities of gait and mobility: Secondary | ICD-10-CM | POA: Diagnosis not present

## 2015-05-11 DIAGNOSIS — H3530 Unspecified macular degeneration: Secondary | ICD-10-CM | POA: Diagnosis not present

## 2015-05-11 DIAGNOSIS — H5411 Blindness, right eye, low vision left eye: Secondary | ICD-10-CM | POA: Diagnosis not present

## 2015-05-13 DIAGNOSIS — M6281 Muscle weakness (generalized): Secondary | ICD-10-CM | POA: Diagnosis not present

## 2015-05-13 DIAGNOSIS — R269 Unspecified abnormalities of gait and mobility: Secondary | ICD-10-CM | POA: Diagnosis not present

## 2015-05-17 DIAGNOSIS — R269 Unspecified abnormalities of gait and mobility: Secondary | ICD-10-CM | POA: Diagnosis not present

## 2015-05-17 DIAGNOSIS — M6281 Muscle weakness (generalized): Secondary | ICD-10-CM | POA: Diagnosis not present

## 2015-05-30 DIAGNOSIS — I442 Atrioventricular block, complete: Secondary | ICD-10-CM | POA: Diagnosis not present

## 2015-06-03 ENCOUNTER — Other Ambulatory Visit: Payer: Self-pay

## 2015-06-03 MED ORDER — ALPRAZOLAM 0.5 MG PO TABS: 0.5000 mg | ORAL_TABLET | Freq: Every evening | ORAL | Status: DC | PRN

## 2015-06-03 NOTE — Telephone Encounter (Signed)
Please call in xanax with 1 refills 

## 2015-06-03 NOTE — Telephone Encounter (Signed)
rx called into pharmacy

## 2015-06-03 NOTE — Telephone Encounter (Signed)
Last seen 04/11/15  Dr Evette Doffing  Requesting 90 day supply  If approved route to nurse to call into [facility]

## 2015-07-14 ENCOUNTER — Encounter: Payer: Self-pay | Admitting: Internal Medicine

## 2015-07-14 ENCOUNTER — Ambulatory Visit (INDEPENDENT_AMBULATORY_CARE_PROVIDER_SITE_OTHER): Payer: Medicare Other | Admitting: *Deleted

## 2015-07-14 DIAGNOSIS — Z950 Presence of cardiac pacemaker: Secondary | ICD-10-CM

## 2015-07-14 DIAGNOSIS — I442 Atrioventricular block, complete: Secondary | ICD-10-CM

## 2015-07-14 LAB — CUP PACEART INCLINIC DEVICE CHECK
Battery Impedance: 3200 Ohm
Brady Statistic RV Percent Paced: 99 %
Date Time Interrogation Session: 20170202112353
Implantable Lead Location: 753860
Lead Channel Setting Pacing Pulse Width: 0.4 ms
Lead Channel Setting Sensing Sensitivity: 2 mV
MDC IDC LEAD IMPLANT DT: 20090127
MDC IDC LEAD IMPLANT DT: 20090127
MDC IDC LEAD LOCATION: 753859
MDC IDC MSMT BATTERY VOLTAGE: 2.79 V
MDC IDC MSMT LEADCHNL RV IMPEDANCE VALUE: 421 Ohm
MDC IDC MSMT LEADCHNL RV PACING THRESHOLD AMPLITUDE: 0.75 V
MDC IDC MSMT LEADCHNL RV PACING THRESHOLD PULSEWIDTH: 0.4 ms
MDC IDC MSMT LEADCHNL RV SENSING INTR AMPL: 7 mV
MDC IDC PG SERIAL: 2010227

## 2015-07-14 NOTE — Progress Notes (Signed)
Pacemaker check in clinic. Normal device function. Threshold, sensing, impedance consistent with previous measurements. Device programmed to maximize longevity. No high ventricular rates noted. Device programmed at appropriate safety margins. Histogram distribution appropriate for patient activity level- no rates below 70bpm, hysteresis appears to be off. Patient admits to only taking Xarelto every other day due to seeing blood in the commode one day. I reiterated xarelto instructions- he seems happy with his plan to continue EOD. Estimated longevity 3.5-4.25 years Patient enrolled in TTM's. ROV with SK in August.

## 2015-07-26 DIAGNOSIS — H8143 Vertigo of central origin, bilateral: Secondary | ICD-10-CM | POA: Diagnosis not present

## 2015-08-20 ENCOUNTER — Other Ambulatory Visit: Payer: Self-pay | Admitting: Family Medicine

## 2015-08-21 DIAGNOSIS — Z87891 Personal history of nicotine dependence: Secondary | ICD-10-CM | POA: Diagnosis not present

## 2015-08-21 DIAGNOSIS — S60412A Abrasion of right middle finger, initial encounter: Secondary | ICD-10-CM | POA: Diagnosis not present

## 2015-08-21 DIAGNOSIS — X58XXXA Exposure to other specified factors, initial encounter: Secondary | ICD-10-CM | POA: Diagnosis not present

## 2015-08-21 DIAGNOSIS — L089 Local infection of the skin and subcutaneous tissue, unspecified: Secondary | ICD-10-CM | POA: Diagnosis not present

## 2015-08-29 DIAGNOSIS — I442 Atrioventricular block, complete: Secondary | ICD-10-CM | POA: Diagnosis not present

## 2015-09-07 ENCOUNTER — Ambulatory Visit (INDEPENDENT_AMBULATORY_CARE_PROVIDER_SITE_OTHER): Payer: Medicare Other | Admitting: Family Medicine

## 2015-09-07 ENCOUNTER — Encounter: Payer: Self-pay | Admitting: Family Medicine

## 2015-09-07 VITALS — BP 127/68 | HR 69 | Temp 95.5°F | Ht 74.0 in | Wt 201.0 lb

## 2015-09-07 DIAGNOSIS — N184 Chronic kidney disease, stage 4 (severe): Secondary | ICD-10-CM

## 2015-09-07 DIAGNOSIS — E785 Hyperlipidemia, unspecified: Secondary | ICD-10-CM

## 2015-09-07 DIAGNOSIS — I480 Paroxysmal atrial fibrillation: Secondary | ICD-10-CM

## 2015-09-07 DIAGNOSIS — E039 Hypothyroidism, unspecified: Secondary | ICD-10-CM

## 2015-09-07 MED ORDER — ALPRAZOLAM 0.5 MG PO TABS: 0.5000 mg | ORAL_TABLET | Freq: Every evening | ORAL | Status: DC | PRN

## 2015-09-07 NOTE — Progress Notes (Signed)
   HPI  Patient presents today here for follow-up refill on Xanax  Patient explains that the [facility] recently took him off ofSynthroid due to very low TSH, he has not taken any Synthroid for about 2 months.  Xanax Takes for sleep, has been on it for several years, no falls. Denies anxiety. States he takes about 1/2-1 pill nightly.  A. Fib Only taking 1 Xarelto every other night. States that when he first started the medication, over one year ago, he had an episode of seeing blood in the toilet after he urinated, he did not see acome out in his urine. He started one pill a night at that time No other signs of bleeding He does bruise easily when he's on daily dosing  Dizziness 2+ years Increases with standing and walking No room spinning sensation but he suspicious of vertigo, he's been doing epley's maneuvers. cT of the head October 2016 without any acute findings. No changes lately  no falls  doing well with a walker    PMH: Smoking status noted ROS: Per HPI  Objective: BP 127/68 mmHg  Pulse 69  Temp(Src) 95.5 F (35.3 C) (Oral)  Ht _0  (1.88 m)  Wt 201 lb (91.173 kg)  BMI 25.80 kg/m2 Gen: NAD, alert, cooperative with exam HEENT: NCAT CV: RRR Resp: CTABL, no wheezes, non-labored Ext: No edema, warm Neuro: Alert and oriented, No gross deficits  Assessment and plan:  # Afib Rate controlled Discussed daily xarelto dosing, Cr Cl 50, wil likely have to change dose vs changing to eliquis for more safety with renal disease Continue daily xarelto for now, CMP pending  # Hypothyroidism TSH, stopped synthroid for now  # CKD stage 4 CMP, last Cre was better than previous, liely have to change to eliquis  # dizziness Sounds like orthostasis, reassurance provided Discussed vertigo and epley's maneuvers  HLD Off meds except for fish oil- agree Labs today but given age I'm not sure a statin is beneficial  Orders Placed This Encounter  Procedures  . CMP14+EGFR  .  CBC with Differential  . TSH  . Lipid Panel    Meds ordered this encounter  Medications  . DISCONTD: ALPRAZolam (XANAX) 0.5 MG tablet    Sig: Take 1 tablet (0.5 mg total) by mouth at bedtime as needed. for sleep    Dispense:  90 tablet    Refill:  0  . ALPRAZolam (XANAX) 0.5 MG tablet    Sig: Take 1 tablet (0.5 mg total) by mouth at bedtime as needed. for sleep    Dispense:  90 tablet    Refill:  [place], MD [facility] 09/07/2015, 12:12 PM

## 2015-09-07 NOTE — Patient Instructions (Signed)
Great to meet you!  Come back in 3 months for routine follow up  We will call with labs  For now take 1 xarelto daily. We may change the dose based on his kidney function. I may change him to eliquis for more safety with kidney dysfunction

## 2015-09-08 ENCOUNTER — Other Ambulatory Visit: Payer: Self-pay | Admitting: Family Medicine

## 2015-09-08 LAB — CMP14+EGFR
A/G RATIO: 1.7 (ref 1.2–2.2)
ALT: 16 IU/L (ref 0–44)
AST: 25 IU/L (ref 0–40)
Albumin: 4.5 g/dL (ref 3.2–4.6)
Alkaline Phosphatase: 65 IU/L (ref 39–117)
BILIRUBIN TOTAL: 0.6 mg/dL (ref 0.0–1.2)
BUN / CREAT RATIO: 14 (ref 10–22)
BUN: 19 mg/dL (ref 10–36)
CHLORIDE: 95 mmol/L — AB (ref 96–106)
CO2: 25 mmol/L (ref 18–29)
Calcium: 9.6 mg/dL (ref 8.6–10.2)
Creatinine, Ser: 1.35 mg/dL — ABNORMAL HIGH (ref 0.76–1.27)
GFR, EST AFRICAN AMERICAN: 53 mL/min/{1.73_m2} — AB (ref >59)
GFR, EST NON AFRICAN AMERICAN: 46 mL/min/{1.73_m2} — AB (ref >59)
GLOBULIN, TOTAL: 2.6 g/dL (ref 1.5–4.5)
Glucose: 92 mg/dL (ref 65–99)
POTASSIUM: 4.3 mmol/L (ref 3.5–5.2)
SODIUM: 139 mmol/L (ref 134–144)
TOTAL PROTEIN: 7.1 g/dL (ref 6.0–8.5)

## 2015-09-08 LAB — CBC WITH DIFFERENTIAL/PLATELET
BASOS ABS: 0.1 10*3/uL (ref 0.0–0.2)
BASOS: 1 %
EOS (ABSOLUTE): 0.4 10*3/uL (ref 0.0–0.4)
Eos: 7 %
Hematocrit: 44.4 % (ref 37.5–51.0)
Hemoglobin: 14.7 g/dL (ref 12.6–17.7)
Immature Grans (Abs): 0 10*3/uL (ref 0.0–0.1)
Immature Granulocytes: 0 %
LYMPHS ABS: 1.8 10*3/uL (ref 0.7–3.1)
Lymphs: 36 %
MCH: 29.6 pg (ref 26.6–33.0)
MCHC: 33.1 g/dL (ref 31.5–35.7)
MCV: 89 fL (ref 79–97)
Monocytes Absolute: 0.4 10*3/uL (ref 0.1–0.9)
Monocytes: 8 %
NEUTROS ABS: 2.4 10*3/uL (ref 1.4–7.0)
Neutrophils: 48 %
PLATELETS: 203 10*3/uL (ref 150–379)
RBC: 4.97 x10E6/uL (ref 4.14–5.80)
RDW: 16.1 % — ABNORMAL HIGH (ref 12.3–15.4)
WBC: 5 10*3/uL (ref 3.4–10.8)

## 2015-09-08 LAB — LIPID PANEL
CHOL/HDL RATIO: 11.1 ratio — AB (ref 0.0–5.0)
Cholesterol, Total: 454 mg/dL — ABNORMAL HIGH (ref 100–199)
HDL: 41 mg/dL (ref >39)
Triglycerides: 578 mg/dL (ref 0–149)

## 2015-09-08 LAB — TSH: TSH: 123 u[IU]/mL — AB (ref 0.450–4.500)

## 2015-09-08 MED ORDER — RIVAROXABAN 15 MG PO TABS: 15.0000 mg | ORAL_TABLET | Freq: Every day | ORAL | Status: DC

## 2015-09-08 MED ORDER — LEVOTHYROXINE SODIUM 100 MCG PO TABS: 100.0000 ug | ORAL_TABLET | Freq: Every day | ORAL | Status: DC

## 2015-09-08 NOTE — Telephone Encounter (Signed)
Medication adjustment based on labs  Patient concerned about xarelto dose, decreasing to 15 daily from 20 daily based on age and CrCl 46, (he was taking 20 every other day)  Re-start synthroid at 170mcg (previousl 200  And [facility] stopped for low TSH)  Very elevated cholesterol and Trigs with know CAD. I respect stopping statins at 89 but I think its worth a discussion. Will ask him to follow up with me or his pcp to discuss.   Will ask nursing to communicate, sent through results notes  Laroy Apple, MD [facility] 09/08/2015, 5:35 PM

## 2015-09-22 ENCOUNTER — Other Ambulatory Visit: Payer: Self-pay

## 2015-09-22 MED ORDER — RIVAROXABAN 15 MG PO TABS: 15.0000 mg | ORAL_TABLET | Freq: Every day | ORAL | Status: DC

## 2015-11-09 ENCOUNTER — Other Ambulatory Visit: Payer: Self-pay

## 2015-11-09 MED ORDER — ALPRAZOLAM 0.5 MG PO TABS: 0.5000 mg | ORAL_TABLET | Freq: Every evening | ORAL | Status: DC | PRN

## 2015-11-09 NOTE — Telephone Encounter (Signed)
Xanax refill called to [facility].

## 2015-11-09 NOTE — Telephone Encounter (Signed)
Patient last seen in office on 09-07-15. Rx last filled on 09-07-15. Please and route to Pool A so nurse can phone in to [facility]

## 2015-11-25 ENCOUNTER — Encounter: Payer: Self-pay | Admitting: Family Medicine

## 2015-11-25 ENCOUNTER — Ambulatory Visit (INDEPENDENT_AMBULATORY_CARE_PROVIDER_SITE_OTHER): Payer: Medicare Other | Admitting: Family Medicine

## 2015-11-25 VITALS — BP 132/74 | HR 75 | Temp 97.4°F | Ht 74.0 in | Wt 201.0 lb

## 2015-11-25 DIAGNOSIS — R300 Dysuria: Secondary | ICD-10-CM | POA: Diagnosis not present

## 2015-11-25 DIAGNOSIS — E039 Hypothyroidism, unspecified: Secondary | ICD-10-CM

## 2015-11-25 DIAGNOSIS — E1122 Type 2 diabetes mellitus with diabetic chronic kidney disease: Secondary | ICD-10-CM | POA: Diagnosis not present

## 2015-11-25 LAB — URINALYSIS, COMPLETE
Bilirubin, UA: NEGATIVE
Glucose, UA: NEGATIVE
Ketones, UA: NEGATIVE
NITRITE UA: NEGATIVE
PH UA: 5.5 (ref 5.0–7.5)
PROTEIN UA: NEGATIVE
Specific Gravity, UA: 1.02 (ref 1.005–1.030)
UUROB: 0.2 mg/dL (ref 0.2–1.0)

## 2015-11-25 LAB — MICROSCOPIC EXAMINATION

## 2015-11-25 LAB — BAYER DCA HB A1C WAIVED: HB A1C (BAYER DCA - WAIVED): 6 % (ref <7.0)

## 2015-11-25 MED ORDER — CEPHALEXIN 500 MG PO CAPS: 500.0000 mg | ORAL_CAPSULE | Freq: Four times a day (QID) | ORAL | Status: DC

## 2015-11-25 NOTE — Progress Notes (Signed)
BP 132/74 mmHg  Pulse 75  Temp(Src) 97.4 F (36.3 C) (Oral)  Ht _0  (1.88 m)  Wt 201 lb (91.173 kg)  BMI 25.80 kg/m2   Subjective:    Patient ID: [name], male    DOB: 1921/10/12, 82 y.o.   MRN: [id number]  HPI: [name] is a 82 y.o. male presenting on 11/25/2015 for Urinary Tract Infection and Ear Pain   HPI Dysuria Patient comes in today with dysuria has been going on for the past few days. He denies any fevers or chills or flank pain. He denies any hematuria. He has had urinary tract infections previously and feels like this is similar to what he has had previously.  Type 2 diabetes recheck Patient comes in today for recheck of his type 2 diabetes. He he is not currently on anything for his diabetes and wants to see where it is. He has been trying diet and exercise for this.  Hypothyroidism Patient is currently on 100 g of thyroid medication. he is coming in today for recheck. He denies any palpitations or diarrhea or constipation or chest pain.  Relevant past medical, surgical, family and social history reviewed and updated as indicated. Interim medical history since our last visit reviewed. Allergies and medications reviewed and updated.  Review of Systems  Constitutional: Negative for fever.  HENT: Negative for ear discharge and ear pain.   Eyes: Negative for discharge and visual disturbance.  Respiratory: Negative for shortness of breath and wheezing.   Cardiovascular: Negative for chest pain and leg swelling.  Gastrointestinal: Negative for abdominal pain, diarrhea and constipation.  Genitourinary: Positive for dysuria, urgency and frequency. Negative for hematuria, flank pain and difficulty urinating.  Musculoskeletal: Negative for back pain and gait problem.  Skin: Negative for rash.  Neurological: Negative for syncope, light-headedness and headaches.  All other systems reviewed and are negative.   Per HPI unless specifically indicated above     Medication List       This list is accurate as of: 11/25/15  1:20 PM.  Always use your most recent med list.               ALPRAZolam 0.5 MG tablet  Commonly known as:  XANAX  Take 1 tablet (0.5 mg total) by mouth at bedtime as needed. for sleep     cephALEXin 500 MG capsule  Commonly known as:  KEFLEX  Take 1 capsule (500 mg total) by mouth 4 (four) times daily.     levothyroxine 100 MCG tablet  Commonly known as:  SYNTHROID, LEVOTHROID  Take 1 tablet (100 mcg total) by mouth daily.     nitroGLYCERIN 0.4 MG SL tablet  Commonly known as:  NITROSTAT  Every 5 minutes as needed (max of 3 doses)     omega-3 acid ethyl esters 1 g capsule  Commonly known as:  LOVAZA  TAKE 2 CAPSULES TWICE A DAY     Rivaroxaban 15 MG Tabs tablet  Commonly known as:  XARELTO  Take 1 tablet (15 mg total) by mouth daily with supper.           Objective:    BP 132/74 mmHg  Pulse 75  Temp(Src) 97.4 F (36.3 C) (Oral)  Ht _1  (1.88 m)  Wt 201 lb (91.173 kg)  BMI 25.80 kg/m2  Wt Readings from Last 3 Encounters:  11/25/15 201 lb (91.173 kg)  09/07/15 201 lb (91.173 kg)  01/11/15 220 lb (99.791 kg)  Physical Exam  Constitutional: He is oriented to person, place, and time. He appears well-developed and well-nourished. No distress.  Eyes: Conjunctivae and EOM are normal. Pupils are equal, round, and reactive to light. Right eye exhibits no discharge. No scleral icterus.  Neck: Neck supple. No thyromegaly present.  Cardiovascular: Normal rate, regular rhythm, normal heart sounds and intact distal pulses.   No murmur heard. Pulmonary/Chest: Effort normal and breath sounds normal. No respiratory distress. He has no wheezes.  Abdominal: Soft. Bowel sounds are normal. He exhibits no distension. There is no tenderness. There is no rebound and no guarding.  Musculoskeletal: Normal range of motion. He exhibits no edema.  Lymphadenopathy:    He has no cervical adenopathy.  Neurological: He is  alert and oriented to person, place, and time. Coordination normal.  Skin: Skin is warm and dry. No rash noted. He is not diaphoretic.  Psychiatric: He has a normal mood and affect. His behavior is normal.  Nursing note and vitals reviewed.     Assessment & Plan:   Problem List Items Addressed This Visit      Endocrine   Hypothyroidism - Primary    Recheck labs and follow-up in a week with her PCP      Relevant Medications   levothyroxine (SYNTHROID, LEVOTHROID) 112 MCG tablet   Other Relevant Orders   TSH (Completed)   DM (diabetes mellitus) ([place])    Recheck labs and follow-up in a week with her PCP       Relevant Orders   CMP14+EGFR (Completed)   Bayer DCA Hb A1c Waived (Completed)   Lipid panel (Completed)    Other Visit Diagnoses    Dysuria        Relevant Orders    Urinalysis, Complete (Completed)        Follow up plan: Return in about 1 week (around 12/02/2015), or if symptoms worsen or fail to improve, for Follow-up in one week with PCP to establish regular routine care.  Counseling provided for all of the vaccine components Orders Placed This Encounter  Procedures  . Urinalysis, Complete  . CMP14+EGFR  . TSH  . Bayer DCA Hb A1c Waived  . Lipid panel    Caryl Pina, MD [facility] 11/25/2015, 1:20 PM

## 2015-11-25 NOTE — Assessment & Plan Note (Signed)
Recheck labs and follow-up in a week with her PCP

## 2015-11-26 LAB — LIPID PANEL
CHOL/HDL RATIO: 4.8 ratio (ref 0.0–5.0)
Cholesterol, Total: 213 mg/dL — ABNORMAL HIGH (ref 100–199)
HDL: 44 mg/dL (ref >39)
LDL Calculated: 128 mg/dL — ABNORMAL HIGH (ref 0–99)
TRIGLYCERIDES: 204 mg/dL — AB (ref 0–149)
VLDL Cholesterol Cal: 41 mg/dL — ABNORMAL HIGH (ref 5–40)

## 2015-11-26 LAB — CMP14+EGFR
A/G RATIO: 1.3 (ref 1.2–2.2)
ALBUMIN: 4 g/dL (ref 3.2–4.6)
ALT: 6 IU/L (ref 0–44)
AST: 15 IU/L (ref 0–40)
Alkaline Phosphatase: 70 IU/L (ref 39–117)
BILIRUBIN TOTAL: 0.5 mg/dL (ref 0.0–1.2)
BUN / CREAT RATIO: 19 (ref 10–24)
BUN: 27 mg/dL (ref 10–36)
CHLORIDE: 95 mmol/L — AB (ref 96–106)
CO2: 23 mmol/L (ref 18–29)
Calcium: 9.4 mg/dL (ref 8.6–10.2)
Creatinine, Ser: 1.43 mg/dL — ABNORMAL HIGH (ref 0.76–1.27)
GFR calc non Af Amer: 43 mL/min/{1.73_m2} — ABNORMAL LOW (ref >59)
GFR, EST AFRICAN AMERICAN: 49 mL/min/{1.73_m2} — AB (ref >59)
GLOBULIN, TOTAL: 3 g/dL (ref 1.5–4.5)
GLUCOSE: 101 mg/dL — AB (ref 65–99)
Potassium: 4.5 mmol/L (ref 3.5–5.2)
Sodium: 138 mmol/L (ref 134–144)
TOTAL PROTEIN: 7 g/dL (ref 6.0–8.5)

## 2015-11-26 LAB — TSH: TSH: 25 u[IU]/mL — ABNORMAL HIGH (ref 0.450–4.500)

## 2015-11-28 MED ORDER — LEVOTHYROXINE SODIUM 112 MCG PO TABS: 112.0000 ug | ORAL_TABLET | Freq: Every day | ORAL | Status: DC

## 2015-11-29 ENCOUNTER — Emergency Department (HOSPITAL_COMMUNITY)
Admission: EM | Admit: 2015-11-29 | Discharge: 2015-11-29 | Disposition: A | Discharge status: Home / Self Care | Payer: Medicare Other | Attending: Emergency Medicine | Admitting: Emergency Medicine

## 2015-11-29 ENCOUNTER — Telehealth: Payer: Self-pay

## 2015-11-29 ENCOUNTER — Ambulatory Visit: Payer: Medicare Other | Admitting: Family Medicine

## 2015-11-29 ENCOUNTER — Encounter (HOSPITAL_COMMUNITY): Payer: Self-pay

## 2015-11-29 ENCOUNTER — Emergency Department (HOSPITAL_COMMUNITY): Payer: Medicare Other

## 2015-11-29 ENCOUNTER — Other Ambulatory Visit: Payer: Self-pay

## 2015-11-29 DIAGNOSIS — Z87891 Personal history of nicotine dependence: Secondary | ICD-10-CM | POA: Diagnosis not present

## 2015-11-29 DIAGNOSIS — R531 Weakness: Secondary | ICD-10-CM | POA: Insufficient documentation

## 2015-11-29 DIAGNOSIS — E039 Hypothyroidism, unspecified: Secondary | ICD-10-CM | POA: Diagnosis not present

## 2015-11-29 DIAGNOSIS — Z8546 Personal history of malignant neoplasm of prostate: Secondary | ICD-10-CM | POA: Insufficient documentation

## 2015-11-29 DIAGNOSIS — R5383 Other fatigue: Secondary | ICD-10-CM | POA: Diagnosis not present

## 2015-11-29 DIAGNOSIS — I455 Other specified heart block: Secondary | ICD-10-CM | POA: Diagnosis not present

## 2015-11-29 DIAGNOSIS — I10 Essential (primary) hypertension: Secondary | ICD-10-CM | POA: Insufficient documentation

## 2015-11-29 DIAGNOSIS — I2510 Atherosclerotic heart disease of native coronary artery without angina pectoris: Secondary | ICD-10-CM | POA: Diagnosis not present

## 2015-11-29 DIAGNOSIS — E119 Type 2 diabetes mellitus without complications: Secondary | ICD-10-CM | POA: Diagnosis not present

## 2015-11-29 DIAGNOSIS — Z79899 Other long term (current) drug therapy: Secondary | ICD-10-CM | POA: Insufficient documentation

## 2015-11-29 DIAGNOSIS — Z792 Long term (current) use of antibiotics: Secondary | ICD-10-CM | POA: Insufficient documentation

## 2015-11-29 DIAGNOSIS — R930 Abnormal findings on diagnostic imaging of skull and head, not elsewhere classified: Secondary | ICD-10-CM | POA: Diagnosis not present

## 2015-11-29 DIAGNOSIS — H539 Unspecified visual disturbance: Secondary | ICD-10-CM | POA: Diagnosis not present

## 2015-11-29 DIAGNOSIS — E785 Hyperlipidemia, unspecified: Secondary | ICD-10-CM | POA: Diagnosis not present

## 2015-11-29 DIAGNOSIS — I6782 Cerebral ischemia: Secondary | ICD-10-CM | POA: Insufficient documentation

## 2015-11-29 LAB — BASIC METABOLIC PANEL
ANION GAP: 11 (ref 5–15)
BUN: 23 mg/dL — ABNORMAL HIGH (ref 6–20)
CALCIUM: 8.9 mg/dL (ref 8.9–10.3)
CO2: 24 mmol/L (ref 22–32)
CREATININE: 1.45 mg/dL — AB (ref 0.61–1.24)
Chloride: 97 mmol/L — ABNORMAL LOW (ref 101–111)
GFR, EST AFRICAN AMERICAN: 47 mL/min — AB (ref >60)
GFR, EST NON AFRICAN AMERICAN: 41 mL/min — AB (ref >60)
Glucose, Bld: 108 mg/dL — ABNORMAL HIGH (ref 65–99)
Potassium: 4.5 mmol/L (ref 3.5–5.1)
SODIUM: 132 mmol/L — AB (ref 135–145)

## 2015-11-29 LAB — URINALYSIS, ROUTINE W REFLEX MICROSCOPIC
Bilirubin Urine: NEGATIVE
Glucose, UA: NEGATIVE mg/dL
Hgb urine dipstick: NEGATIVE
KETONES UR: NEGATIVE mg/dL
NITRITE: NEGATIVE
PROTEIN: NEGATIVE mg/dL
Specific Gravity, Urine: 1.019 (ref 1.005–1.030)
pH: 7.5 (ref 5.0–8.0)

## 2015-11-29 LAB — CBC
HCT: 39 % (ref 39.0–52.0)
Hemoglobin: 12.9 g/dL — ABNORMAL LOW (ref 13.0–17.0)
MCH: 29.7 pg (ref 26.0–34.0)
MCHC: 33.1 g/dL (ref 30.0–36.0)
MCV: 89.9 fL (ref 78.0–100.0)
PLATELETS: 262 10*3/uL (ref 150–400)
RBC: 4.34 MIL/uL (ref 4.22–5.81)
RDW: 13.4 % (ref 11.5–15.5)
WBC: 7.5 10*3/uL (ref 4.0–10.5)

## 2015-11-29 LAB — URINE MICROSCOPIC-ADD ON

## 2015-11-29 LAB — CBG MONITORING, ED: GLUCOSE-CAPILLARY: 113 mg/dL — AB (ref 65–99)

## 2015-11-29 MED ORDER — SODIUM CHLORIDE 0.9 % IV SOLN: INTRAVENOUS | Status: DC

## 2015-11-29 MED ORDER — SODIUM CHLORIDE 0.9 % IV BOLUS (SEPSIS)
500.0000 mL | Freq: Once | INTRAVENOUS | Status: AC
  Administered 2015-11-29: 500 mL via INTRAVENOUS

## 2015-11-29 MED ORDER — SODIUM CHLORIDE 0.9 % IV BOLUS (SEPSIS): 250.0000 mL | Freq: Once | INTRAVENOUS | Status: DC
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
## 2015-11-29 NOTE — Discharge Instructions (Signed)
Workup here today without any significant lab abnormalities. Head CT negative chest x-ray negative. Recommend further follow-up with primary care doctor. Return for any new or worse symptoms. Unfortunately patient not a candidate for MRI because of the pacemaker so not able to completely evaluate the walking and standing problems. No evidence of significant dehydration.

## 2015-11-29 NOTE — ED Notes (Signed)
Pt states weakness x 2 years.  However, pt went to [facility] last week for evaluation.  Has been told his labs are off.  Kidney function.  ? Dehydration.  Pt states weakness to the point of difficulty with ambulation.  No fever/cough/congestion.  Pt does have frequent urination.

## 2015-11-29 NOTE — ED Notes (Signed)
MD at bedside. 

## 2015-11-29 NOTE — Telephone Encounter (Signed)
Son called office stating that he felt like his dad may be dehydrated. He has no energy, will not eat and drink, and is very weak. Advised me that they wished to go to [facility] ER and would like for [place] to call and let them know that they are coming. Advised son that I would. Called the charge nurse in ER and gave report

## 2015-11-29 NOTE — ED Notes (Signed)
Pt being sent by PCP r/o dehydration.  Pt c/o increasing weakness and lack of appetite x 1 week.  PCP reports "thyroid and kidney function are out of whack."

## 2015-12-01 DIAGNOSIS — I442 Atrioventricular block, complete: Secondary | ICD-10-CM | POA: Diagnosis not present

## 2015-12-05 ENCOUNTER — Ambulatory Visit: Payer: Medicare Other | Admitting: Family Medicine

## 2015-12-23 DIAGNOSIS — R310 Gross hematuria: Secondary | ICD-10-CM | POA: Diagnosis not present

## 2015-12-23 DIAGNOSIS — Z8546 Personal history of malignant neoplasm of prostate: Secondary | ICD-10-CM | POA: Diagnosis not present

## 2015-12-23 DIAGNOSIS — R32 Unspecified urinary incontinence: Secondary | ICD-10-CM | POA: Diagnosis not present

## 2016-01-31 DIAGNOSIS — J342 Deviated nasal septum: Secondary | ICD-10-CM | POA: Diagnosis not present

## 2016-01-31 DIAGNOSIS — H903 Sensorineural hearing loss, bilateral: Secondary | ICD-10-CM | POA: Diagnosis not present

## 2016-01-31 DIAGNOSIS — H6122 Impacted cerumen, left ear: Secondary | ICD-10-CM | POA: Diagnosis not present

## 2016-02-01 ENCOUNTER — Other Ambulatory Visit: Payer: Self-pay | Admitting: Family Medicine

## 2016-02-02 ENCOUNTER — Ambulatory Visit: Payer: Medicare Other | Admitting: Family Medicine

## 2016-02-02 ENCOUNTER — Encounter: Payer: Self-pay | Admitting: Family Medicine

## 2016-02-02 ENCOUNTER — Ambulatory Visit (INDEPENDENT_AMBULATORY_CARE_PROVIDER_SITE_OTHER): Payer: Medicare Other | Admitting: Family Medicine

## 2016-02-02 VITALS — BP 137/68 | HR 70 | Temp 97.0°F | Ht 74.0 in | Wt 205.8 lb

## 2016-02-02 DIAGNOSIS — N184 Chronic kidney disease, stage 4 (severe): Secondary | ICD-10-CM

## 2016-02-02 DIAGNOSIS — I480 Paroxysmal atrial fibrillation: Secondary | ICD-10-CM

## 2016-02-02 DIAGNOSIS — E039 Hypothyroidism, unspecified: Secondary | ICD-10-CM

## 2016-02-02 DIAGNOSIS — F411 Generalized anxiety disorder: Secondary | ICD-10-CM | POA: Diagnosis not present

## 2016-02-02 MED ORDER — ALPRAZOLAM 0.5 MG PO TABS: 0.5000 mg | ORAL_TABLET | Freq: Every evening | ORAL | 0 refills | Status: DC | PRN

## 2016-02-02 NOTE — Patient Instructions (Signed)
Great to see you!  We will call with your labs results within 1 week  Lets plan on seeing you again in 3 months

## 2016-02-02 NOTE — Progress Notes (Signed)
   HPI  Patient presents today here for follow-up of hypothyroidism, anxiety, and CKD, and anticoagulation.  Hypothyroidism Good medication compliance, takes with Xarelto. Cannot tell a difference after changing medication dose.  Anxiety Taking approximately one half to one Xanax every night to help him sleep. States he's taking mostly one half pills.  Anticoagulation For Atrial fibrillation No signs of bleeding, reports good daily compliance. States that if medication was twice daily he would be less likely to use it and does not want to change medication.   PMH: Smoking status noted ROS: Per HPI  Objective: BP 137/68 (BP Location: Left Arm, Patient Position: Sitting, Cuff Size: Normal)   Pulse 70   Temp 97 F (36.1 C) (Oral)   Ht 6\' 2"  (1.88 m)   Wt 205 lb 12.8 oz (93.4 kg)   BMI 26.42 kg/m  Gen: NAD, alert, cooperative with exam HEENT: NCAT CV: regular rate, distant heart sounds Resp: CTABL, no wheezes, non-labored Ext: No edema, warm Neuro: Alert and oriented, No gross deficits  Assessment and plan:  # Hypothyroidism Clinically stable Last TSH was 25, rechecking Continue Synthroid at 112 g for now, consider separating from other medications if still not controlled  # Atrial fibrillation, chronic anticoagulation, CKD 4 Rate controlled Given chronic kidney disease I have discussed with him and his daughter that it would be best for him to take renally dosed eliquis, however he does not want to pursue this b/c of BID dosing.  CrCL 44 labs  # Anxiety Stable Last refill 90 pills about 3 months ago Discussed using xanax sparingly, refilled.      No orders of the defined types were placed in this encounter.   No orders of the defined types were placed in this encounter.   Laroy Apple, MD [facility] 02/02/2016, 12:00 PM

## 2016-02-03 LAB — TSH: TSH: 26.74 u[IU]/mL — ABNORMAL HIGH (ref 0.450–4.500)

## 2016-02-03 LAB — CBC WITH DIFFERENTIAL/PLATELET
BASOS: 1 %
Basophils Absolute: 0.1 10*3/uL (ref 0.0–0.2)
EOS (ABSOLUTE): 0.4 10*3/uL (ref 0.0–0.4)
Eos: 6 %
HEMOGLOBIN: 12 g/dL — AB (ref 12.6–17.7)
Hematocrit: 37.1 % — ABNORMAL LOW (ref 37.5–51.0)
IMMATURE GRANS (ABS): 0 10*3/uL (ref 0.0–0.1)
Immature Granulocytes: 0 %
LYMPHS ABS: 1.3 10*3/uL (ref 0.7–3.1)
LYMPHS: 22 %
MCH: 28.2 pg (ref 26.6–33.0)
MCHC: 32.3 g/dL (ref 31.5–35.7)
MCV: 87 fL (ref 79–97)
Monocytes Absolute: 0.7 10*3/uL (ref 0.1–0.9)
Monocytes: 12 %
NEUTROS ABS: 3.7 10*3/uL (ref 1.4–7.0)
Neutrophils: 59 %
PLATELETS: 221 10*3/uL (ref 150–379)
RBC: 4.26 x10E6/uL (ref 4.14–5.80)
RDW: 16.1 % — ABNORMAL HIGH (ref 12.3–15.4)
WBC: 6.1 10*3/uL (ref 3.4–10.8)

## 2016-02-03 LAB — BMP8+EGFR
BUN / CREAT RATIO: 21 (ref 10–24)
BUN: 26 mg/dL (ref 10–36)
CHLORIDE: 97 mmol/L (ref 96–106)
CO2: 24 mmol/L (ref 18–29)
Calcium: 9.5 mg/dL (ref 8.6–10.2)
Creatinine, Ser: 1.22 mg/dL (ref 0.76–1.27)
GFR calc non Af Amer: 52 mL/min/{1.73_m2} — ABNORMAL LOW (ref >59)
GFR, EST AFRICAN AMERICAN: 60 mL/min/{1.73_m2} (ref >59)
GLUCOSE: 97 mg/dL (ref 65–99)
Potassium: 4.8 mmol/L (ref 3.5–5.2)
SODIUM: 137 mmol/L (ref 134–144)

## 2016-02-08 ENCOUNTER — Encounter: Payer: Self-pay | Admitting: Internal Medicine

## 2016-02-08 ENCOUNTER — Telehealth: Payer: Self-pay

## 2016-02-08 ENCOUNTER — Ambulatory Visit (INDEPENDENT_AMBULATORY_CARE_PROVIDER_SITE_OTHER): Payer: Medicare Other | Admitting: Internal Medicine

## 2016-02-08 DIAGNOSIS — I442 Atrioventricular block, complete: Secondary | ICD-10-CM

## 2016-02-08 MED ORDER — NITROGLYCERIN 0.4 MG SL SUBL: 0.4000 mg | SUBLINGUAL_TABLET | SUBLINGUAL | 3 refills | Status: DC | PRN

## 2016-02-08 NOTE — Patient Instructions (Signed)
Medication Instructions: - Your physician recommends that you continue on your current medications as directed. Please refer to the Current Medication list given to you today.  Labwork: - none today  Procedures/Testing: - none   Follow-Up: - Your physician wants you to follow-up in: 1 year with Dr. Caryl Comes. You will receive a reminder letter in the mail two months in advance. If you don't receive a letter, please call our office to schedule the follow-up appointment.  Any Additional Special Instructions Will Be Listed Below (If Applicable).     If you need a refill on your cardiac medications before your next appointment, please call your pharmacy.

## 2016-02-08 NOTE — Progress Notes (Signed)
So Patient Care Team: [name], MD as PCP - General (Family Medicine)   HPI  [name] is a 82 y.o. male seen in followup for a pacemaker implanted for CHB 2009.   He also has CAD with a chronically occluded right coronary artery. He had an LAD with 90% stenosis at the take off of the first diagonal. He had a drug-eluting stent placed by Dr. Burt Knack 2009  Myoview scan done 2013 was negative for ischemia;  LV function 60%   He is largely nonambulatory   8/17 TSH 26.7 GFR 50 +/-   Past Medical History:  Diagnosis Date  . Blindness of right eye    Related to trauma in [place]  . CAD (coronary artery disease)    (non Q wave MI in Jan 2009. He had chronically ocluded right coronary artery. He had a LAD w/90% stenosis at the take off of the first diagona. He had a drug-eluding stent palced by Dr Burt Knack)  . Cancer [facility])    prostate ca with radiation in 2005  . Cellulitis of face feb 2011   prostetic rt eye, cellulitis of rt side of face  . Complete heart block (HCC)    Zephyr dual chamber pacemaker per Dr Caryl Comes  . Diabetes mellitus without complication ([place])   . Dyslipidemia   . Hypertension   . Hypothyroid   . Macular degeneration     Past Surgical History:  Procedure Laterality Date  . eye removed Right 1997    Current Outpatient Prescriptions  Medication Sig Dispense Refill  . ALPRAZolam (XANAX) 0.5 MG tablet Take 1 tablet (0.5 mg total) by mouth at bedtime as needed. for sleep 90 tablet 0  . Cholecalciferol (VITAMIN D PO) Take 1 tablet by mouth daily.    Marland Kitchen levothyroxine (SYNTHROID, LEVOTHROID) 112 MCG tablet Take 1 tablet (112 mcg total) by mouth daily. 90 tablet 1  . MELATONIN PO Take 1 tablet by mouth at bedtime.    . nitroGLYCERIN (NITROSTAT) 0.4 MG SL tablet Place 0.4 mg under the tongue every 5 (five) minutes as needed for chest pain (Do not exceed 3 doses).    Marland Kitchen omega-3 acid ethyl esters (LOVAZA) 1 g capsule Take 2 g by mouth 2 (two) times daily.    .  Rivaroxaban (XARELTO) 15 MG TABS tablet Take 15 mg by mouth daily with supper.     No current facility-administered medications for this visit.     No Known Allergies  Review of Systems negative except from HPI and PMH  Physical Exam BP (!) 120/50   Pulse 70   Ht 6' (1.829 m)   Wt 205 lb (93 kg)   SpO2 97%   BMI 27.80 kg/m  Well developed and well nourished in no acute distress HENT normal E scleral and icterus clear Neck Supple JVP flat; carotids brisk and full Clear to ausculation regular rate and rhythm Soft with active bowel sounds No clubbing cyanosis 2+Edema Alert and oriented, wide-based and unstable gait Skin Warm and Dry    Assessment and  Plan  Congestive heart failure-chronic diastolic  Atrial fibrillation-persistent  Thrombolic risk factors hypertension-1 age-1 vascular disease-1  Pacemaker-[employer]  Hypothyoidism  Treated    He was currently incompetent and reprogramming of his device [id number] was associated with a significant improvement in exercise performance  Spoke with [facility]  regarding the lowering of the Synthroid with a TSH over 25. She was grateful for being alerted to and she was going to review  the record as it did not make sense to her either

## 2016-02-08 NOTE — Telephone Encounter (Signed)
Calling about lab results. Patient aware of lab results and verbalizes understanding.

## 2016-02-10 ENCOUNTER — Telehealth: Payer: Self-pay | Admitting: Family Medicine

## 2016-02-10 NOTE — Telephone Encounter (Signed)
Pt aware and he hasn't been taking all of his meds together. He will start taking it by itself with only water and wait 30 minutes prior to taking anything else or eating.

## 2016-02-10 NOTE — Telephone Encounter (Signed)
I have reviewed his recent thyroid meds and TSH's  His previous TSH was elevated, At that time his dose was escalated but it made a difference in his TSH.  I am concerned his synthroid is not be well absorbed.   I think an increase in synthroid dose is reasonable but may not be the root cause.   I will request that the patient take Synthroid at least 30 minutes on an empty stomach  apart from any other medications and with only water.  Laroy Apple, MD [facility] 02/10/2016, 8:00 AM

## 2016-03-02 DIAGNOSIS — I442 Atrioventricular block, complete: Secondary | ICD-10-CM | POA: Diagnosis not present

## 2016-04-15 ENCOUNTER — Other Ambulatory Visit: Payer: Self-pay | Admitting: Family Medicine

## 2016-04-16 NOTE — Telephone Encounter (Signed)
See last TSH

## 2016-04-27 ENCOUNTER — Other Ambulatory Visit: Payer: Self-pay | Admitting: *Deleted

## 2016-04-27 MED ORDER — GLUCOSE BLOOD VI STRP: ORAL_STRIP | 3 refills | Status: DC

## 2016-05-01 ENCOUNTER — Other Ambulatory Visit: Payer: Self-pay | Admitting: Family Medicine

## 2016-05-08 ENCOUNTER — Ambulatory Visit (INDEPENDENT_AMBULATORY_CARE_PROVIDER_SITE_OTHER): Payer: Medicare Other | Admitting: Family Medicine

## 2016-05-08 ENCOUNTER — Encounter: Payer: Self-pay | Admitting: Family Medicine

## 2016-05-08 ENCOUNTER — Other Ambulatory Visit: Payer: Self-pay | Admitting: Family Medicine

## 2016-05-08 VITALS — BP 116/69 | HR 69 | Temp 96.8°F

## 2016-05-08 DIAGNOSIS — E1122 Type 2 diabetes mellitus with diabetic chronic kidney disease: Secondary | ICD-10-CM | POA: Diagnosis not present

## 2016-05-08 DIAGNOSIS — I480 Paroxysmal atrial fibrillation: Secondary | ICD-10-CM

## 2016-05-08 DIAGNOSIS — F411 Generalized anxiety disorder: Secondary | ICD-10-CM

## 2016-05-08 DIAGNOSIS — E039 Hypothyroidism, unspecified: Secondary | ICD-10-CM | POA: Diagnosis not present

## 2016-05-08 DIAGNOSIS — N184 Chronic kidney disease, stage 4 (severe): Secondary | ICD-10-CM

## 2016-05-08 DIAGNOSIS — N3281 Overactive bladder: Secondary | ICD-10-CM | POA: Insufficient documentation

## 2016-05-08 LAB — BAYER DCA HB A1C WAIVED: HB A1C: 6.5 % (ref <7.0)

## 2016-05-08 MED ORDER — MIRABEGRON ER 25 MG PO TB24: 25.0000 mg | ORAL_TABLET | Freq: Every day | ORAL | 3 refills | Status: DC

## 2016-05-08 MED ORDER — ALPRAZOLAM 0.5 MG PO TABS: 0.5000 mg | ORAL_TABLET | Freq: Every evening | ORAL | 0 refills | Status: DC | PRN

## 2016-05-08 NOTE — Progress Notes (Signed)
   HPI  Patient presents today here for follow-up of type 2 diabetes, anxiety, hypothyroidism, chronic kidney disease, and A. fib.  Patient is chronically anticoagulated on renally dosed Xarelto for A. fib. He denies any excessive bleeding. He has good medication compliance.  Type 2 diabetes Checking blood sugars intermittently fasting 150-200, has noticed a difference with his liberated diet lately in the holiday season.  Thyroid disease Good medication compliance, previously elevated TSH Has been taking this 30 minutes before his other medications   A. fib No palpitations, feels well.   PMH: Smoking status noted ROS: Per HPI  Objective: BP 116/69   Pulse 69   Temp (!) 96.8 F (36 C) (Oral)  Gen: NAD, alert, cooperative with exam HEENT: NCAT CV: RRR, good S1/S2, no murmur Resp: CTABL, no wheezes, non-labored Ext: No edema, warm Neuro: Alert and oriented, No gross deficits  Diabetic Foot Exam - Simple   Simple Foot Form Visual Inspection See comments:  Yes Sensation Testing See comments:  Yes Pulse Check Posterior Tibialis and Dorsalis pulse intact bilaterally:  Yes Comments Sensation absent to monofilament throughout, thickened yellow toenails throughout, scaling diffusely across the foot       Assessment and plan:  # T2 diabetes Diet controlled A1c 6.5 Continue monitoring closely with A1c every 3 months   # Anxiety Taking Xanax one half pill 1-2 times daily, patient denies any dizziness associated with dosing He takes one half pill for sleep on most nights.   # Hypothyroidism Patient has changed the way he takes Synthroid, takes 30 minutes before all the other medications Good medication compliance, repeat TSH  # Paroxysmal atrial fibrillation, chronic anticoagulation Creatinine clearance is hovering around 50, his Xarelto is renally dosed, technically 15 mg for cranial clearance from 15-50 ml/Min  Chronic kidney disease stage IV, repeating  labs    Orders Placed This Encounter  Procedures  . Bayer DCA Hb A1c Waived  . TSH  . CBC with Differential/Platelet  . CMP14+EGFR    Meds ordered this encounter  Medications  . ALPRAZolam (XANAX) 0.5 MG tablet    Sig: Take 1 tablet (0.5 mg total) by mouth at bedtime as needed. for sleep    Dispense:  90 tablet    Refill:  0    Laroy Apple, MD [facility] 05/08/2016, 1:48 PM

## 2016-05-08 NOTE — Telephone Encounter (Signed)
Sent to pharmacy.   Laroy Apple, MD [facility] 05/08/2016, 5:00 PM

## 2016-05-08 NOTE — Telephone Encounter (Signed)
Pt wants RX for overactive bladder Was seen today

## 2016-05-08 NOTE — Patient Instructions (Signed)
Great to see you!  Come back in 3 months  We will call with lab results within 1 week

## 2016-05-08 NOTE — Telephone Encounter (Signed)
Called and discussed personally.   Laroy Apple, MD [facility] 05/08/2016, 5:18 PM

## 2016-05-08 NOTE — Telephone Encounter (Signed)
Pt notified of RX 

## 2016-05-09 LAB — CBC WITH DIFFERENTIAL/PLATELET
BASOS: 1 %
Basophils Absolute: 0.1 10*3/uL (ref 0.0–0.2)
EOS (ABSOLUTE): 0.3 10*3/uL (ref 0.0–0.4)
Eos: 5 %
Hematocrit: 38.2 % (ref 37.5–51.0)
Hemoglobin: 12.5 g/dL — ABNORMAL LOW (ref 12.6–17.7)
IMMATURE GRANULOCYTES: 0 %
Immature Grans (Abs): 0 10*3/uL (ref 0.0–0.1)
Lymphocytes Absolute: 1.3 10*3/uL (ref 0.7–3.1)
Lymphs: 18 %
MCH: 28.9 pg (ref 26.6–33.0)
MCHC: 32.7 g/dL (ref 31.5–35.7)
MCV: 88 fL (ref 79–97)
MONOS ABS: 0.7 10*3/uL (ref 0.1–0.9)
Monocytes: 9 %
NEUTROS PCT: 67 %
Neutrophils Absolute: 4.9 10*3/uL (ref 1.4–7.0)
PLATELETS: 225 10*3/uL (ref 150–379)
RBC: 4.33 x10E6/uL (ref 4.14–5.80)
RDW: 14.5 % (ref 12.3–15.4)
WBC: 7.4 10*3/uL (ref 3.4–10.8)

## 2016-05-09 LAB — CMP14+EGFR
A/G RATIO: 1.4 (ref 1.2–2.2)
ALK PHOS: 71 IU/L (ref 39–117)
ALT: 8 IU/L (ref 0–44)
AST: 14 IU/L (ref 0–40)
Albumin: 3.9 g/dL (ref 3.2–4.6)
BILIRUBIN TOTAL: 0.4 mg/dL (ref 0.0–1.2)
BUN/Creatinine Ratio: 19 (ref 10–24)
BUN: 23 mg/dL (ref 10–36)
CALCIUM: 9.2 mg/dL (ref 8.6–10.2)
CHLORIDE: 98 mmol/L (ref 96–106)
CO2: 24 mmol/L (ref 18–29)
Creatinine, Ser: 1.22 mg/dL (ref 0.76–1.27)
GFR calc Af Amer: 59 mL/min/{1.73_m2} — ABNORMAL LOW (ref >59)
GFR, EST NON AFRICAN AMERICAN: 51 mL/min/{1.73_m2} — AB (ref >59)
Globulin, Total: 2.8 g/dL (ref 1.5–4.5)
Glucose: 105 mg/dL — ABNORMAL HIGH (ref 65–99)
POTASSIUM: 4.6 mmol/L (ref 3.5–5.2)
Sodium: 139 mmol/L (ref 134–144)
Total Protein: 6.7 g/dL (ref 6.0–8.5)

## 2016-05-09 LAB — TSH: TSH: 11.55 u[IU]/mL — ABNORMAL HIGH (ref 0.450–4.500)

## 2016-05-10 ENCOUNTER — Other Ambulatory Visit: Payer: Self-pay | Admitting: Family Medicine

## 2016-05-10 MED ORDER — LEVOTHYROXINE SODIUM 125 MCG PO TABS: 125.0000 ug | ORAL_TABLET | Freq: Every day | ORAL | 3 refills | Status: DC

## 2016-05-10 MED ORDER — MIRABEGRON ER 25 MG PO TB24: 25.0000 mg | ORAL_TABLET | Freq: Every day | ORAL | 3 refills | Status: DC

## 2016-05-16 ENCOUNTER — Telehealth: Payer: Self-pay | Admitting: *Deleted

## 2016-05-16 MED ORDER — OXYBUTYNIN CHLORIDE ER 5 MG PO TB24: 5.0000 mg | ORAL_TABLET | Freq: Every day | ORAL | 1 refills | Status: DC

## 2016-05-16 NOTE — Telephone Encounter (Signed)
Patient aware and would [facility]

## 2016-05-16 NOTE — Telephone Encounter (Signed)
Insurance rejected coverage of Myrbetriq ER 25mg . Will cover Oxybutynin ER in either 5, 10, 15mg  or tolterodine tartrate ER 2 or 4mg .   Would you like to change to one of these or proceed with a prior authorization? I don't believe a PA will be successful.   -Cyril Mourning

## 2016-05-16 NOTE — Telephone Encounter (Signed)
Trial of oxybutinin, 1 pill each night.   Laroy Apple, MD [facility] 05/16/2016, 12:08 PM

## 2016-05-16 NOTE — Telephone Encounter (Signed)
Patient aware and verbalizes insurance.

## 2016-06-01 ENCOUNTER — Encounter: Payer: Self-pay | Admitting: Internal Medicine

## 2016-06-01 DIAGNOSIS — I442 Atrioventricular block, complete: Secondary | ICD-10-CM | POA: Diagnosis not present

## 2016-06-19 ENCOUNTER — Telehealth: Payer: Self-pay | Admitting: Family Medicine

## 2016-06-19 NOTE — Telephone Encounter (Signed)
Patient aware to most recent dosage listed in chart is 126mcg of levothyroxine

## 2016-07-02 ENCOUNTER — Other Ambulatory Visit: Payer: Self-pay | Admitting: Internal Medicine

## 2016-07-06 ENCOUNTER — Other Ambulatory Visit: Payer: Self-pay | Admitting: Internal Medicine

## 2016-07-13 ENCOUNTER — Other Ambulatory Visit: Payer: Self-pay | Admitting: *Deleted

## 2016-07-13 DIAGNOSIS — R296 Repeated falls: Secondary | ICD-10-CM | POA: Diagnosis not present

## 2016-07-13 DIAGNOSIS — F411 Generalized anxiety disorder: Secondary | ICD-10-CM

## 2016-07-13 DIAGNOSIS — M6281 Muscle weakness (generalized): Secondary | ICD-10-CM | POA: Diagnosis not present

## 2016-07-13 MED ORDER — ALPRAZOLAM 0.5 MG PO TABS: 0.5000 mg | ORAL_TABLET | Freq: Every evening | ORAL | 0 refills | Status: DC | PRN

## 2016-07-13 NOTE — Telephone Encounter (Signed)
Patient aware and verbalizes understanding. Rx placed up front.

## 2016-07-17 DIAGNOSIS — M6281 Muscle weakness (generalized): Secondary | ICD-10-CM | POA: Diagnosis not present

## 2016-07-17 DIAGNOSIS — R296 Repeated falls: Secondary | ICD-10-CM | POA: Diagnosis not present

## 2016-07-19 ENCOUNTER — Telehealth: Payer: Self-pay | Admitting: Family Medicine

## 2016-07-19 DIAGNOSIS — M6281 Muscle weakness (generalized): Secondary | ICD-10-CM | POA: Diagnosis not present

## 2016-07-19 DIAGNOSIS — R296 Repeated falls: Secondary | ICD-10-CM | POA: Diagnosis not present

## 2016-07-19 NOTE — Telephone Encounter (Signed)
Patient has diet controlled DM2, this does not require medications. See last progress note. It is completely ok to stop checking blood sugars as his disease state is very mild.   Nosebleeds Considering his age anticoagulation is risky, I would recommend follow up within the next 5-7 days for this so we can discuss thoroughly and re-check CBC.   Laroy Apple, MD [facility] 07/19/2016, 1:05 PM

## 2016-07-19 NOTE — Telephone Encounter (Signed)
[name] informs them that he has nosebleeds every morning and with his blood thinner they wanted to let you know this.  Also, patient states he is diabetic and is suppose to check his sugar daily, but he  legally blind so cannot do this unless a neighbor helps out.  Also there are not meds for him concerning his diabetes.  Just wanted to pass this information along and have you call and let them know what to do

## 2016-07-19 NOTE — Telephone Encounter (Signed)
Spoke to PT and advised of MD feedback and scheduled pt with Dr. Wendi Snipes 07/24/16 at 2:55 and PT said she will let pt know.

## 2016-07-23 DIAGNOSIS — M6281 Muscle weakness (generalized): Secondary | ICD-10-CM | POA: Diagnosis not present

## 2016-07-23 DIAGNOSIS — R296 Repeated falls: Secondary | ICD-10-CM | POA: Diagnosis not present

## 2016-07-24 ENCOUNTER — Ambulatory Visit (INDEPENDENT_AMBULATORY_CARE_PROVIDER_SITE_OTHER): Payer: Medicare Other | Admitting: Family Medicine

## 2016-07-24 ENCOUNTER — Encounter: Payer: Self-pay | Admitting: Family Medicine

## 2016-07-24 VITALS — BP 139/77 | HR 69 | Temp 97.5°F | Ht 73.83 in | Wt 218.6 lb

## 2016-07-24 DIAGNOSIS — I480 Paroxysmal atrial fibrillation: Secondary | ICD-10-CM | POA: Diagnosis not present

## 2016-07-24 DIAGNOSIS — H6123 Impacted cerumen, bilateral: Secondary | ICD-10-CM

## 2016-07-24 DIAGNOSIS — R040 Epistaxis: Secondary | ICD-10-CM

## 2016-07-24 MED ORDER — APIXABAN 2.5 MG PO TABS: 2.5000 mg | ORAL_TABLET | Freq: Two times a day (BID) | ORAL | 5 refills | Status: DC

## 2016-07-24 NOTE — Patient Instructions (Signed)
Great to see you!  To start eliquis: Take the first pill instead of your xarelto, then continue twice daily.   Please seek immediate medical attention for any uncontrolled bleeding.   We will call within 1 week with lab results

## 2016-07-24 NOTE — Progress Notes (Signed)
   HPI  Patient presents today with frequent nosebleeds and cerumen impaction.  She explains that over the weekend he had 3 nosebleeds lasting 30 minutes or more. He states that they have stopped after he started taking Xarelto every other day.  Patient is taking anticoagulation for paroxysmal A. fib. He has no other bleeding sources.  He feels well otherwise. He has had more nasal congestion lately.   PMH: Smoking status noted ROS: Per HPI  Objective: BP 139/77   Pulse 69   Temp 97.5 F (36.4 C) (Oral)   Ht 6' 1.83" (1.875 m)   Wt 218 lb 9.6 oz (99.2 kg)   BMI 28.20 kg/m  Gen: NAD, alert, cooperative with exam HEENT: NCAT, areas with swollen turbinates bilaterally but no fresh blood or visible vessels, oropharynx moist and clear, TMs obscured by cerumen bilaterally CV: No murmur appreciated, regular rate Resp: CTABL, no wheezes, non-labored Ext: No edema, warm Neuro: Alert and oriented, No gross deficits  Assessment and plan:  # Paroxysmal atrial fibrillation Rate controlled Anticoagulated currently with Xarelto, I have discussed with patient that taking Xarelto every other day is not therapeutic. I have continued to discuss the seriousness of anticoagulation and very low threshold for immediate medical attention for any uncontrolled bleeding, even nosebleeds for more than 20 minutes. Changing from Xarelto to eliquis, reduced dose eliquis, 2.5 mg BID   # Nosebleeds I'm suspicious of nasal mucosa irritation with easy bleeding due to anticoagulation Currently no bleeding Nasal saline or nasal saline gel regularly  # Impacted cerumen Irrigated in clinic today with partial removal.  Supportive care- mineral oil drops, may return for another irrigation.    Orders Placed This Encounter  Procedures  . CMP14+EGFR  . CBC with Differential/Platelet    Meds ordered this encounter  Medications  . apixaban (ELIQUIS) 2.5 MG TABS tablet    Sig: Take 1 tablet (2.5 mg  total) by mouth 2 (two) times daily.    Dispense:  180 tablet    Refill:  [place], MD [facility] 07/24/2016, 5:23 PM

## 2016-07-25 ENCOUNTER — Telehealth: Payer: Self-pay | Admitting: Family Medicine

## 2016-07-25 DIAGNOSIS — M6281 Muscle weakness (generalized): Secondary | ICD-10-CM | POA: Diagnosis not present

## 2016-07-25 DIAGNOSIS — R296 Repeated falls: Secondary | ICD-10-CM | POA: Diagnosis not present

## 2016-07-25 LAB — CMP14+EGFR
A/G RATIO: 1.3 (ref 1.2–2.2)
ALBUMIN: 4 g/dL (ref 3.2–4.6)
ALK PHOS: 60 IU/L (ref 39–117)
ALT: 10 IU/L (ref 0–44)
AST: 14 IU/L (ref 0–40)
BILIRUBIN TOTAL: 0.4 mg/dL (ref 0.0–1.2)
BUN / CREAT RATIO: 19 (ref 10–24)
BUN: 25 mg/dL (ref 10–36)
CHLORIDE: 97 mmol/L (ref 96–106)
CO2: 25 mmol/L (ref 18–29)
Calcium: 9.5 mg/dL (ref 8.6–10.2)
Creatinine, Ser: 1.29 mg/dL — ABNORMAL HIGH (ref 0.76–1.27)
GFR calc non Af Amer: 48 mL/min/{1.73_m2} — ABNORMAL LOW (ref >59)
GFR, EST AFRICAN AMERICAN: 55 mL/min/{1.73_m2} — AB (ref >59)
GLUCOSE: 108 mg/dL — AB (ref 65–99)
Globulin, Total: 3 g/dL (ref 1.5–4.5)
POTASSIUM: 4.5 mmol/L (ref 3.5–5.2)
Sodium: 140 mmol/L (ref 134–144)
Total Protein: 7 g/dL (ref 6.0–8.5)

## 2016-07-25 LAB — CBC WITH DIFFERENTIAL/PLATELET
Basophils Absolute: 0 10*3/uL (ref 0.0–0.2)
Basos: 1 %
EOS (ABSOLUTE): 0.4 10*3/uL (ref 0.0–0.4)
Eos: 6 %
HEMATOCRIT: 36.4 % — AB (ref 37.5–51.0)
Hemoglobin: 12 g/dL — ABNORMAL LOW (ref 13.0–17.7)
IMMATURE GRANS (ABS): 0 10*3/uL (ref 0.0–0.1)
Immature Granulocytes: 1 %
LYMPHS ABS: 1.5 10*3/uL (ref 0.7–3.1)
Lymphs: 24 %
MCH: 29.6 pg (ref 26.6–33.0)
MCHC: 33 g/dL (ref 31.5–35.7)
MCV: 90 fL (ref 79–97)
MONOCYTES: 11 %
MONOS ABS: 0.7 10*3/uL (ref 0.1–0.9)
NEUTROS ABS: 3.6 10*3/uL (ref 1.4–7.0)
Neutrophils: 57 %
Platelets: 241 10*3/uL (ref 150–379)
RBC: 4.05 x10E6/uL — ABNORMAL LOW (ref 4.14–5.80)
RDW: 14.9 % (ref 12.3–15.4)
WBC: 6.2 10*3/uL (ref 3.4–10.8)

## 2016-07-25 NOTE — Telephone Encounter (Signed)
Pharmacy has questions about myrbetriq 25 mg which is not on his medication list. Is patient on medication?

## 2016-07-25 NOTE — Telephone Encounter (Signed)
He is actually on ditropan, I tried to send myrbetriq but this was denied.   Laroy Apple, MD [facility] 07/25/2016, 1:45 PM

## 2016-07-25 NOTE — Telephone Encounter (Signed)
Pt called me back - he is just taking the myrbetric 25 mg now  - no longer on the ditropan 5 mg   Please refill myrbetric 25 to mail order if approved

## 2016-07-25 NOTE — Addendum Note (Signed)
Addended by: Zannie Cove on: 07/25/2016 04:52 PM   Modules accepted: Orders

## 2016-07-25 NOTE — Telephone Encounter (Signed)
Spoke with mail order = they do not show the pt on ditropan at all - last fill they see was 05/16/16 at [facility]   She states she will leave both the myrbetric and the ditropan off until we send them one or the other.  I attempted calling [place], but had to leave a message. I want him or a sitter to look through his pill bottles and see what he is actually taking .... Ditropan or myrbetric  - hopefully not both.  I left the message for them to call back to Dr Alen Bleacher nurse to discuss.  Please send which ever he needs to mail order once confirmed.

## 2016-07-26 MED ORDER — MYRBETRIQ 25 MG PO TB24: 25.0000 mg | ORAL_TABLET | Freq: Every day | ORAL | 3 refills | Status: DC

## 2016-07-26 NOTE — Telephone Encounter (Signed)
Rx Sent.   Laroy Apple, MD [facility] 07/26/2016, 7:43 AM

## 2016-07-26 NOTE — Addendum Note (Signed)
Addended by: [name] on: 07/26/2016 07:43 AM   Modules accepted: Orders

## 2016-07-26 NOTE — Telephone Encounter (Signed)
Patient aware.

## 2016-07-30 ENCOUNTER — Other Ambulatory Visit: Payer: Self-pay | Admitting: Family Medicine

## 2016-07-30 DIAGNOSIS — R296 Repeated falls: Secondary | ICD-10-CM | POA: Diagnosis not present

## 2016-07-30 DIAGNOSIS — M6281 Muscle weakness (generalized): Secondary | ICD-10-CM | POA: Diagnosis not present

## 2016-07-31 ENCOUNTER — Telehealth: Payer: Self-pay | Admitting: Family Medicine

## 2016-07-31 MED ORDER — LEVOTHYROXINE SODIUM 125 MCG PO TABS
125.0000 ug | ORAL_TABLET | Freq: Every day | ORAL | 1 refills | Status: DC
Start: 2016-07-31 — End: 2016-09-25

## 2016-07-31 NOTE — Telephone Encounter (Signed)
Patient received call from [facility] for refill of Levothyroxine 125 mcg and he thinks he may have accidentally denied it instead of accepting it.  He was confused between his Eliquis and his Levothyroxine.  I contacted [facility] and they had contacted him about this, they need a new prescription sent in for the 125 mcg.  I will send this electronically.  Patient aware we are taking care of this.

## 2016-08-09 ENCOUNTER — Ambulatory Visit: Payer: Medicare Other | Admitting: Family Medicine

## 2016-09-01 ENCOUNTER — Encounter: Payer: Self-pay | Admitting: Internal Medicine

## 2016-09-01 DIAGNOSIS — I442 Atrioventricular block, complete: Secondary | ICD-10-CM | POA: Diagnosis not present

## 2016-09-10 ENCOUNTER — Ambulatory Visit: Payer: Medicare Other | Admitting: Family Medicine

## 2016-09-10 ENCOUNTER — Encounter: Payer: Self-pay | Admitting: Family Medicine

## 2016-09-10 ENCOUNTER — Ambulatory Visit (INDEPENDENT_AMBULATORY_CARE_PROVIDER_SITE_OTHER): Payer: Medicare Other | Admitting: Family Medicine

## 2016-09-10 VITALS — BP 129/71 | HR 73 | Temp 97.2°F

## 2016-09-10 DIAGNOSIS — I25810 Atherosclerosis of coronary artery bypass graft(s) without angina pectoris: Secondary | ICD-10-CM | POA: Diagnosis not present

## 2016-09-10 DIAGNOSIS — E1122 Type 2 diabetes mellitus with diabetic chronic kidney disease: Secondary | ICD-10-CM | POA: Diagnosis not present

## 2016-09-10 DIAGNOSIS — E039 Hypothyroidism, unspecified: Secondary | ICD-10-CM | POA: Diagnosis not present

## 2016-09-10 DIAGNOSIS — I480 Paroxysmal atrial fibrillation: Secondary | ICD-10-CM

## 2016-09-10 LAB — BAYER DCA HB A1C WAIVED: HB A1C: 6.1 % (ref <7.0)

## 2016-09-10 MED ORDER — NITROGLYCERIN 0.4 MG SL SUBL: 0.4000 mg | SUBLINGUAL_TABLET | SUBLINGUAL | 3 refills | Status: AC | PRN

## 2016-09-10 NOTE — Patient Instructions (Signed)
Great to see you!  I would recommend taking eliquis twice daily, I would like to start you on a daily plain claritin to see if we can calm down your nasal congestion that likely caused the nosebleed  Come back in 3 months

## 2016-09-10 NOTE — Progress Notes (Signed)
   HPI  Patient presents today for follow-up for atrial fibrillation, diabetes, hypothyroidism.  Patient has had some nosebleeds recently, he states that he believes this is due to [facility] was. He had one nosebleed after blowing his nose that lasted about 5 minutes and stop with pressure. The next nosebleed lasted less than 5 minutes and started after nose blowing as well.  Patient denies any chest pain, needs refill on nitroglycerin.  He would like to have his thyroid rechecked. He is asymptomatic, good medication compliance  PMH: Smoking status noted ROS: Per HPI  Objective: BP 129/71   Pulse 73   Temp 97.2 F (36.2 C) (Oral)  Gen: NAD, alert, cooperative with exam HEENT: NCAT, drooping of the right eyelid, right nares with erythematous boggy mucosa, left with swollen turbinate CV: Distant heart sounds difficult to appreciate rhythm Resp: CTABL, no wheezes, non-labored Ext: No edema, warm Neuro: Alert and oriented, No gross deficits  Assessment and plan:  # Type 2 diabetes A1c stable, actually improved, controlled Continue without medications, diet controlled  # Paroxysmal atrial fibrillation Discussed that Ellik was taken once daily does not provide 24 hours of coverage, recommended cautiously restarting twice daily. Low threshold for seeking treatment for any bleeding. Considered 10 mg Xarelto, however this is not well studied for preventing clot and A. fib.  # Hypothyroidism Repeat TSH Stable clinically Caution with increase given atrial fibrillation  # CAD  Asymptomatic, refilled nitroglycerin    Orders Placed This Encounter  Procedures  . Bayer DCA Hb A1c Waived  . CBC with Differential/Platelet  . TSH    Meds ordered this encounter  Medications  . nitroGLYCERIN (NITROSTAT) 0.4 MG SL tablet    Sig: Place 1 tablet (0.4 mg total) under the tongue every 5 (five) minutes as needed for chest pain (Do not exceed 3 doses).    Dispense:  25 tablet    Refill:   [place], MD [place] 09/10/2016, 12:06 PM

## 2016-09-11 LAB — CBC WITH DIFFERENTIAL/PLATELET
BASOS ABS: 0.1 10*3/uL (ref 0.0–0.2)
Basos: 1 %
EOS (ABSOLUTE): 0.4 10*3/uL (ref 0.0–0.4)
Eos: 8 %
Hematocrit: 40.3 % (ref 37.5–51.0)
Hemoglobin: 13.3 g/dL (ref 13.0–17.7)
Immature Grans (Abs): 0 10*3/uL (ref 0.0–0.1)
Immature Granulocytes: 0 %
LYMPHS ABS: 1.5 10*3/uL (ref 0.7–3.1)
Lymphs: 28 %
MCH: 29.5 pg (ref 26.6–33.0)
MCHC: 33 g/dL (ref 31.5–35.7)
MCV: 89 fL (ref 79–97)
MONOS ABS: 0.5 10*3/uL (ref 0.1–0.9)
Monocytes: 9 %
NEUTROS ABS: 2.9 10*3/uL (ref 1.4–7.0)
Neutrophils: 54 %
PLATELETS: 231 10*3/uL (ref 150–379)
RBC: 4.51 x10E6/uL (ref 4.14–5.80)
RDW: 14.3 % (ref 12.3–15.4)
WBC: 5.3 10*3/uL (ref 3.4–10.8)

## 2016-09-11 LAB — TSH: TSH: 13.43 u[IU]/mL — AB (ref 0.450–4.500)

## 2016-09-13 ENCOUNTER — Encounter: Payer: Self-pay | Admitting: Internal Medicine

## 2016-09-13 ENCOUNTER — Telehealth: Payer: Self-pay | Admitting: Internal Medicine

## 2016-09-13 ENCOUNTER — Telehealth: Payer: Self-pay | Admitting: *Deleted

## 2016-09-13 ENCOUNTER — Ambulatory Visit (INDEPENDENT_AMBULATORY_CARE_PROVIDER_SITE_OTHER): Payer: Medicare Other | Admitting: *Deleted

## 2016-09-13 DIAGNOSIS — I442 Atrioventricular block, complete: Secondary | ICD-10-CM

## 2016-09-13 DIAGNOSIS — I480 Paroxysmal atrial fibrillation: Secondary | ICD-10-CM | POA: Diagnosis not present

## 2016-09-13 LAB — CUP PACEART INCLINIC DEVICE CHECK
Battery Impedance: 6800 Ohm
Battery Voltage: 2.71 V
Date Time Interrogation Session: 20180405113414
Implantable Lead Implant Date: 20090127
Implantable Lead Location: 753860
Implantable Pulse Generator Implant Date: 20090127
Lead Channel Pacing Threshold Amplitude: 0.75 V
Lead Channel Setting Pacing Pulse Width: 0.4 ms
Lead Channel Setting Sensing Sensitivity: 2 mV
MDC IDC LEAD IMPLANT DT: 20090127
MDC IDC LEAD LOCATION: 753859
MDC IDC MSMT LEADCHNL RV IMPEDANCE VALUE: 432 Ohm
MDC IDC MSMT LEADCHNL RV PACING THRESHOLD PULSEWIDTH: 0.4 ms
MDC IDC MSMT LEADCHNL RV SENSING INTR AMPL: 7.2 mV
MDC IDC PG SERIAL: 2010227
MDC IDC STAT BRADY RV PERCENT PACED: 99 % — AB
Pulse Gen Model: 5826

## 2016-09-13 NOTE — Telephone Encounter (Signed)
In [facility] today, Patient inquired about recent medication change from rivaroxaban to apixaban due to nosebleeds. Called patient to discuss medication recommendation. Per Dr. Caryl Comes the appropriate dose is apixaban 5 mg Bid. Patient verbalized understanding.

## 2016-09-13 NOTE — Progress Notes (Signed)
Pacemaker check in clinic. Normal device function. Threshold, sensing, impedance consistent with previous measurements. Device programmed to maximize longevity. Episode Triggers are off. Device programmed at appropriate safety margins. Histogram distribution appropriate for patient activity level. Device programmed to optimize intrinsic conduction. Estimated longevity 1.5-1.75 years. Patient enrolled in TTM's with [employer]. Patient education completed. ROV with SK 01/18/17.

## 2016-09-13 NOTE — Telephone Encounter (Signed)
Walk in Pt Form-medication changed patient now having Nose bleeds. Please call. Placed in [employer].

## 2016-09-13 NOTE — Progress Notes (Signed)
Pt seen in device clinic today; PCP had changed Rivaroxaban to apixoban but dose Rx was 2.5  Pts age is 6, Wt>100 Kg Cr 1.2-3   Thus appropriate dose is 5 mg bid

## 2016-09-25 ENCOUNTER — Telehealth: Payer: Self-pay | Admitting: Family Medicine

## 2016-09-25 MED ORDER — LEVOTHYROXINE SODIUM 150 MCG PO TABS: 150.0000 ug | ORAL_TABLET | Freq: Every day | ORAL | 3 refills | Status: DC

## 2016-09-25 NOTE — Telephone Encounter (Signed)
Patient aware increased dose of levothyroxine has been sent to [facility]

## 2016-09-25 NOTE — Telephone Encounter (Signed)
Patient is currently taking 125 of levothyroxine.  Per lab note patient's thyroid level was low and medication may need to be increased.

## 2016-09-25 NOTE — Addendum Note (Signed)
Addended by: [name] on: 09/25/2016 12:13 PM   Modules accepted: Orders

## 2016-09-25 NOTE — Telephone Encounter (Signed)
Increase from 125 to 150 mcg.   Laroy Apple, MD [facility] 09/25/2016, 12:12 PM

## 2016-11-09 ENCOUNTER — Encounter: Payer: Self-pay | Admitting: Family Medicine

## 2016-11-09 ENCOUNTER — Ambulatory Visit (INDEPENDENT_AMBULATORY_CARE_PROVIDER_SITE_OTHER): Payer: Medicare Other | Admitting: Family Medicine

## 2016-11-09 VITALS — BP 137/76 | HR 74 | Temp 97.0°F

## 2016-11-09 DIAGNOSIS — I25810 Atherosclerosis of coronary artery bypass graft(s) without angina pectoris: Secondary | ICD-10-CM

## 2016-11-09 DIAGNOSIS — H6123 Impacted cerumen, bilateral: Secondary | ICD-10-CM

## 2016-11-09 NOTE — Progress Notes (Signed)
   HPI  Patient presents today here with trouble hearing.  Patient's lines that he's had decreased hearing from the right ear for the last 1 week. He states that it feels like there's some water in it. He does not remember getting water in his ear. He has had ear irrigation with good success previously at the [place]. It was attempted here previously without good success. He does not use eardrops. He denies fever, chills, sweats, or ear pain.  PMH: Smoking status noted ROS: Per HPI  Objective: BP 137/76   Pulse 74   Temp 97 F (36.1 C) (Oral)  Gen: NAD, alert, cooperative with exam HEENT: NCAT, TMs obscured bilaterally by cerumen, complete cerumen blockage BL After irrigation and curretage the R TM is visible, L TM still obscured after large amount of cerumen removal CV: RRR, good S1/S2, no murmur Resp: CTABL, no wheezes, non-labored Ext: No edema, warm Neuro: Alert and oriented, No gross deficits  Assessment and plan:  # Cerumen impaction Right TM exposed after lavage today, left TM still obscured by cerumen after large amounts of sermon removal and lavage. Recommended follow-up with ENT for attention to the left ear and repeat audiology exam.    Laroy Apple, MD [facility] 11/09/2016, 11:42 AM

## 2016-11-20 DIAGNOSIS — H903 Sensorineural hearing loss, bilateral: Secondary | ICD-10-CM | POA: Diagnosis not present

## 2016-11-20 DIAGNOSIS — H6122 Impacted cerumen, left ear: Secondary | ICD-10-CM | POA: Diagnosis not present

## 2016-11-20 DIAGNOSIS — J342 Deviated nasal septum: Secondary | ICD-10-CM | POA: Diagnosis not present

## 2016-11-29 ENCOUNTER — Other Ambulatory Visit: Payer: Self-pay | Admitting: Family Medicine

## 2016-11-29 DIAGNOSIS — F411 Generalized anxiety disorder: Secondary | ICD-10-CM

## 2016-11-29 MED ORDER — ALPRAZOLAM 0.5 MG PO TABS: 0.5000 mg | ORAL_TABLET | Freq: Every evening | ORAL | 0 refills | Status: DC | PRN

## 2016-11-29 NOTE — Telephone Encounter (Signed)
What is the name of the medication? xanax  Have you contacted your pharmacy to request a refill? no  Which pharmacy would you like this sent to? [facility]   Patient notified that their request is being sent to the clinical staff for review and that they should receive a call once it is complete. If they do not receive a call within 24 hours they can check with their pharmacy or our office.

## 2016-11-29 NOTE — Telephone Encounter (Signed)
Patient last seen in office on 11-09-16. Please advise on xanax refill and route to [facility]

## 2016-11-30 NOTE — Telephone Encounter (Signed)
Phoned, in

## 2016-12-03 ENCOUNTER — Encounter: Payer: Self-pay | Admitting: Internal Medicine

## 2016-12-03 DIAGNOSIS — I442 Atrioventricular block, complete: Secondary | ICD-10-CM | POA: Diagnosis not present

## 2016-12-17 ENCOUNTER — Ambulatory Visit (INDEPENDENT_AMBULATORY_CARE_PROVIDER_SITE_OTHER): Payer: Medicare Other | Admitting: Family Medicine

## 2016-12-17 ENCOUNTER — Encounter: Payer: Self-pay | Admitting: Family Medicine

## 2016-12-17 VITALS — BP 136/73 | HR 69 | Temp 96.7°F | Ht 73.83 in | Wt 198.0 lb

## 2016-12-17 DIAGNOSIS — F411 Generalized anxiety disorder: Secondary | ICD-10-CM

## 2016-12-17 DIAGNOSIS — E039 Hypothyroidism, unspecified: Secondary | ICD-10-CM

## 2016-12-17 DIAGNOSIS — E1122 Type 2 diabetes mellitus with diabetic chronic kidney disease: Secondary | ICD-10-CM

## 2016-12-17 DIAGNOSIS — I25810 Atherosclerosis of coronary artery bypass graft(s) without angina pectoris: Secondary | ICD-10-CM | POA: Diagnosis not present

## 2016-12-17 DIAGNOSIS — N184 Chronic kidney disease, stage 4 (severe): Secondary | ICD-10-CM | POA: Diagnosis not present

## 2016-12-17 LAB — BAYER DCA HB A1C WAIVED: HB A1C (BAYER DCA - WAIVED): 6 % (ref <7.0)

## 2016-12-17 MED ORDER — ALPRAZOLAM 0.5 MG PO TABS: 0.5000 mg | ORAL_TABLET | Freq: Every evening | ORAL | 0 refills | Status: DC | PRN

## 2016-12-17 NOTE — Progress Notes (Signed)
HPI  Patient presents today here for follow-up chronic medical conditions.  Hypothyroidism Patient is taking Synthroid 1 hour apart from other medications. Asymptomatic clinically.  Diabetes Blood sugars usually in the 120s and 130s fasting. Watching diet minimally.  Anxiety, sleep Uses approximately 1 Xanax once a night. He would like to have a refill in hand, he refilled that about 3 weeks ago, gets 90 day refill.  No additional nosebleeds.  PMH: Smoking status noted ROS: Per HPI  Objective: BP 136/73   Pulse 69   Temp (!) 96.7 F (35.9 C) (Oral)   Ht 6' 1.83" (1.875 m)   Wt 198 lb (89.8 kg)   BMI 25.54 kg/m  Gen: NAD, alert, cooperative with exam HEENT: NCAT, EOMI, PERRL CV: RRR, good S1/S2, no murmur Resp: CTABL, no wheezes, non-labored Ext: No edema, warm Neuro: Alert and oriented, No gross deficits  Diabetic Foot Exam - Simple   Simple Foot Form Diabetic Foot exam was performed with the following findings:  Yes 12/17/2016  9:02 AM  Visual Inspection See comments:  Yes Sensation Testing See comments:  Yes Pulse Check Posterior Tibialis and Dorsalis pulse intact bilaterally:  Yes Comments Sensation absent to monofilament throughout, scaling and calluses scattered, however no clear pre-ulcerative callus.      Assessment and plan:  # Type 2 diabetes Well controlled with A1c of 6.0. The medications, diet controlled. Continue to monitor  # Anxiety Refilled Xanax, also uses this for difficulty sleeping.  # Hypothyroidism Previously uncontrolled TSH, repeat TSH now that he is taking Synthroid apart from his other medications.  # Chronic kidney disease stage IV Repeat labs, avoid NSAIDs   Orders Placed This Encounter  Procedures  . Microalbumin / creatinine urine ratio  . Bayer DCA Hb A1c Waived  . CMP14+EGFR  . TSH    Meds ordered this encounter  Medications  . ALPRAZolam (XANAX) 0.5 MG tablet    Sig: Take 1 tablet (0.5 mg total) by mouth  at bedtime as needed. for sleep    Dispense:  90 tablet    Refill:  0    Please do not fill until 90 days after previous fill    Sam Bradshaw, MD [facility] 12/17/2016, 9:03 AM     

## 2016-12-17 NOTE — Patient Instructions (Signed)
Great to see you!  Come back in 3 months unless you need [place] sooner.   We will call with results and adjust your thyroid dose if needed.

## 2016-12-18 LAB — CMP14+EGFR
A/G RATIO: 1.4 (ref 1.2–2.2)
ALT: 13 IU/L (ref 0–44)
AST: 16 IU/L (ref 0–40)
Albumin: 4.1 g/dL (ref 3.2–4.6)
Alkaline Phosphatase: 58 IU/L (ref 39–117)
BUN/Creatinine Ratio: 14 (ref 10–24)
BUN: 23 mg/dL (ref 10–36)
Bilirubin Total: 0.5 mg/dL (ref 0.0–1.2)
CALCIUM: 9.9 mg/dL (ref 8.6–10.2)
CO2: 25 mmol/L (ref 20–29)
CREATININE: 1.61 mg/dL — AB (ref 0.76–1.27)
Chloride: 98 mmol/L (ref 96–106)
GFR, EST AFRICAN AMERICAN: 42 mL/min/{1.73_m2} — AB (ref >59)
GFR, EST NON AFRICAN AMERICAN: 37 mL/min/{1.73_m2} — AB (ref >59)
GLOBULIN, TOTAL: 3 g/dL (ref 1.5–4.5)
Glucose: 105 mg/dL — ABNORMAL HIGH (ref 65–99)
POTASSIUM: 4.6 mmol/L (ref 3.5–5.2)
SODIUM: 141 mmol/L (ref 134–144)
TOTAL PROTEIN: 7.1 g/dL (ref 6.0–8.5)

## 2016-12-18 LAB — TSH: TSH: 5.72 u[IU]/mL — ABNORMAL HIGH (ref 0.450–4.500)

## 2017-01-11 ENCOUNTER — Ambulatory Visit (INDEPENDENT_AMBULATORY_CARE_PROVIDER_SITE_OTHER): Payer: Medicare Other | Admitting: Urology

## 2017-01-11 ENCOUNTER — Other Ambulatory Visit (HOSPITAL_COMMUNITY)
Admission: AD | Admit: 2017-01-11 | Discharge: 2017-01-11 | Disposition: A | Discharge status: Home / Self Care | Payer: Medicare Other | Source: Other Acute Inpatient Hospital | Attending: Urology | Admitting: Urology

## 2017-01-11 DIAGNOSIS — R32 Unspecified urinary incontinence: Secondary | ICD-10-CM

## 2017-01-11 DIAGNOSIS — Z8546 Personal history of malignant neoplasm of prostate: Secondary | ICD-10-CM | POA: Diagnosis not present

## 2017-01-11 LAB — URINALYSIS, COMPLETE (UACMP) WITH MICROSCOPIC
BILIRUBIN URINE: NEGATIVE
Bacteria, UA: NONE SEEN
Glucose, UA: NEGATIVE mg/dL
HGB URINE DIPSTICK: NEGATIVE
Ketones, ur: NEGATIVE mg/dL
Nitrite: NEGATIVE
PROTEIN: NEGATIVE mg/dL
Specific Gravity, Urine: 1.015 (ref 1.005–1.030)
pH: 6 (ref 5.0–8.0)

## 2017-01-13 LAB — URINE CULTURE

## 2017-01-18 ENCOUNTER — Ambulatory Visit (INDEPENDENT_AMBULATORY_CARE_PROVIDER_SITE_OTHER): Payer: Medicare Other | Admitting: Internal Medicine

## 2017-01-18 ENCOUNTER — Encounter: Payer: Self-pay | Admitting: Internal Medicine

## 2017-01-18 ENCOUNTER — Telehealth: Payer: Self-pay | Admitting: Family Medicine

## 2017-01-18 VITALS — BP 118/60 | HR 71 | Ht 73.0 in

## 2017-01-18 DIAGNOSIS — I5032 Chronic diastolic (congestive) heart failure: Secondary | ICD-10-CM | POA: Diagnosis not present

## 2017-01-18 DIAGNOSIS — I442 Atrioventricular block, complete: Secondary | ICD-10-CM | POA: Diagnosis not present

## 2017-01-18 DIAGNOSIS — I4819 Other persistent atrial fibrillation: Secondary | ICD-10-CM

## 2017-01-18 DIAGNOSIS — I481 Persistent atrial fibrillation: Secondary | ICD-10-CM | POA: Diagnosis not present

## 2017-01-18 DIAGNOSIS — Z950 Presence of cardiac pacemaker: Secondary | ICD-10-CM

## 2017-01-18 DIAGNOSIS — I25810 Atherosclerosis of coronary artery bypass graft(s) without angina pectoris: Secondary | ICD-10-CM

## 2017-01-18 NOTE — Progress Notes (Signed)
So Patient Care Team: [name], MD as PCP - General (Family Medicine)   HPI  [name] is a 83 y.o. male seen in followup for a pacemaker implanted for CHB 2009.   He also has CAD with a chronically occluded right coronary artery. He had an LAD with 90% stenosis at the take off of the first diagonal. He had a drug-eluting stent placed by Dr. Burt Knack 2009  Without symptoms of ischemia   NO CHEST pain no sob Biggest problem is polyuria  Myoview scan done 2013 was negative for ischemia;  LV function 60%   He is largely nonambulatory      Date Cr Hgb  7/18 1.6 13.3(4/18)          Past Medical History:  Diagnosis Date  . Blindness of right eye    Related to trauma in [place]  . CAD (coronary artery disease)    (non Q wave MI in Jan 2009. He had chronically ocluded right coronary artery. He had a LAD w/90% stenosis at the take off of the first diagona. He had a drug-eluding stent palced by Dr Burt Knack)  . Cancer [facility])    prostate ca with radiation in 2005  . Cellulitis of face feb 2011   prostetic rt eye, cellulitis of rt side of face  . Complete heart block (HCC)    Zephyr dual chamber pacemaker per Dr Caryl Comes  . Diabetes mellitus without complication ([place])   . Dyslipidemia   . Hypertension   . Hypothyroid   . Macular degeneration     Past Surgical History:  Procedure Laterality Date  . eye removed Right 1997    Current Outpatient Prescriptions  Medication Sig Dispense Refill  . ALPRAZolam (XANAX) 0.5 MG tablet Take 1 tablet (0.5 mg total) by mouth at bedtime as needed. for sleep 90 tablet 0  . apixaban (ELIQUIS) 2.5 MG TABS tablet Take 1 tablet (2.5 mg total) by mouth 2 (two) times daily. 180 tablet 5  . Cholecalciferol (VITAMIN D PO) Take 1 tablet by mouth daily.    Marland Kitchen glucose blood test strip Test BS BID and PRN. Dx e11.9 300 each 3  . levothyroxine (SYNTHROID, LEVOTHROID) 150 MCG tablet Take 1 tablet (150 mcg total) by mouth daily. 90 tablet 3  . MELATONIN  PO Take 1 tablet by mouth at bedtime.    . mirabegron ER (MYRBETRIQ) 50 MG TB24 tablet Take 50 mg by mouth daily.    . nitroGLYCERIN (NITROSTAT) 0.4 MG SL tablet Place 1 tablet (0.4 mg total) under the tongue every 5 (five) minutes as needed for chest pain (Do not exceed 3 doses). 25 tablet 3  . omega-3 acid ethyl esters (LOVAZA) 1 g capsule Take 2 g by mouth 2 (two) times daily.     No current facility-administered medications for this visit.     No Known Allergies  Review of Systems negative except from HPI and PMH  Physical Exam BP 118/60   Pulse 71   Ht 6\' 1"  (1.854 m)   SpO2 98%  Well developed and nourished in no acute distress HENT normal Neck supple with JVP-flat Carotids brisk and full without bruits Clear Device pocket well healed; without hematoma or erythema.  There is no tethering  Regular rate and rhythm, no murmurs or gallops Abd-soft with active BS without hepatomegaly No Clubbing cyanosis edema Skin-warm and dry A & Oriented  Grossly normal sensory and motor function  ECG demonstrates atrial fibrillation with ventricular pacing  Assessment and  Plan  Congestive heart failure-chronic diastolic  Atrial fibrillation-persistent  Thrombolic risk factors hypertension-1 age-28 vascular disease-1  Pacemaker-[employer]   The patient's device was interrogated.  The information was reviewed. No changes were made in the programming.     Hypothyoidism  Treated      On Anticoagulation;  No bleeding issues   Euvolemic continue current meds

## 2017-01-18 NOTE — Patient Instructions (Addendum)
Medication Instructions: - Your physician recommends that you continue on your current medications as directed. Please refer to the Current Medication list given to you today.  Labwork: - none ordered  Procedures/Testing: - none ordered  Follow-Up: - Your physician wants you to follow-up in: 6 months with the [facility] 1 year with Dr. Caryl Comes. You will receive a reminder letter in the mail two months in advance. If you don't receive a letter, please call our office to schedule the follow-up appointment.   Any Additional Special Instructions Will Be Listed Below (If Applicable).     If you need a refill on your cardiac medications before your next appointment, please call your pharmacy.

## 2017-01-21 ENCOUNTER — Ambulatory Visit (INDEPENDENT_AMBULATORY_CARE_PROVIDER_SITE_OTHER): Payer: Medicare Other | Admitting: Family Medicine

## 2017-01-21 ENCOUNTER — Encounter: Payer: Self-pay | Admitting: Family Medicine

## 2017-01-21 VITALS — BP 135/67 | HR 69 | Temp 97.1°F | Ht 73.0 in | Wt 206.6 lb

## 2017-01-21 DIAGNOSIS — N3281 Overactive bladder: Secondary | ICD-10-CM | POA: Diagnosis not present

## 2017-01-21 DIAGNOSIS — I481 Persistent atrial fibrillation: Secondary | ICD-10-CM

## 2017-01-21 DIAGNOSIS — I25810 Atherosclerosis of coronary artery bypass graft(s) without angina pectoris: Secondary | ICD-10-CM | POA: Diagnosis not present

## 2017-01-21 DIAGNOSIS — N184 Chronic kidney disease, stage 4 (severe): Secondary | ICD-10-CM | POA: Diagnosis not present

## 2017-01-21 DIAGNOSIS — I4819 Other persistent atrial fibrillation: Secondary | ICD-10-CM

## 2017-01-21 LAB — BMP8+EGFR
BUN/Creatinine Ratio: 19 (ref 10–24)
BUN: 28 mg/dL (ref 10–36)
CALCIUM: 9.6 mg/dL (ref 8.6–10.2)
CO2: 24 mmol/L (ref 20–29)
Chloride: 100 mmol/L (ref 96–106)
Creatinine, Ser: 1.46 mg/dL — ABNORMAL HIGH (ref 0.76–1.27)
GFR, EST AFRICAN AMERICAN: 48 mL/min/{1.73_m2} — AB (ref >59)
GFR, EST NON AFRICAN AMERICAN: 41 mL/min/{1.73_m2} — AB (ref >59)
Glucose: 100 mg/dL — ABNORMAL HIGH (ref 65–99)
POTASSIUM: 4.6 mmol/L (ref 3.5–5.2)
Sodium: 140 mmol/L (ref 134–144)

## 2017-01-21 NOTE — Progress Notes (Signed)
   HPI  Patient presents today here for ofollow up of CKD.   Pt state that he avoids NSAIDs Has some OAB and has recently had myrbetriq titrated.  Notices frequent night time urination.   No fever, chills, sweats  Sleeping per usual, 530 to 830 using melatonin then uses a xanax at night  Good compliance with synthroid and eliquis, no bleeding.   PMH: Smoking status noted ROS: Per HPI  Objective: BP 135/67   Pulse 69   Temp (!) 97.1 F (36.2 C) (Oral)   Ht _0  (1.854 m)   Wt 206 lb 9.6 oz (93.7 kg)   BMI 27.26 kg/m  Gen: NAD, alert, cooperative with exam HEENT: NCAT CV: Regular rate, no murmur not appreciated Resp: CTABL, no wheezes, non-labored Abd: SNTND, BS present, no guarding or organomegaly, no CVA tenderness Ext: No edema, warm Neuro: Alert and oriented, No gross deficits  Assessment and plan:  # Chronic kidney disease stage IV Repeat labs, discussed avoiding NSAIDs   # Persistent atrial fibrillation Patient rate controlled today Anticoagulated without question CBC No bleeding recently  # Overactive bladder Managed primarily by urology, however patient asked questions today about his medication,myrbetriq We discussed titration which he will do to 50 mg daily.     Orders Placed This Encounter  Procedures  . BMP8+EGFR  . CBC with [place], MD [facility] 01/21/2017, 9:48 AM

## 2017-01-21 NOTE — Addendum Note (Signed)
Addended by: Campbell Riches on: 01/21/2017 07:43 AM   Modules accepted: Orders

## 2017-01-22 LAB — CUP PACEART INCLINIC DEVICE CHECK
Battery Impedance: 10800 Ohm
Battery Voltage: 2.71 V
Implantable Lead Implant Date: 20090127
Implantable Pulse Generator Implant Date: 20090127
Lead Channel Pacing Threshold Pulse Width: 0.4 ms
Lead Channel Setting Sensing Sensitivity: 2 mV
MDC IDC LEAD IMPLANT DT: 20090127
MDC IDC LEAD LOCATION: 753859
MDC IDC LEAD LOCATION: 753860
MDC IDC MSMT LEADCHNL RV IMPEDANCE VALUE: 464 Ohm
MDC IDC MSMT LEADCHNL RV PACING THRESHOLD AMPLITUDE: 1 V
MDC IDC MSMT LEADCHNL RV SENSING INTR AMPL: 7 mV
MDC IDC PG SERIAL: 2010227
MDC IDC SESS DTM: 20180810155713
MDC IDC SET LEADCHNL RV PACING PULSEWIDTH: 0.4 ms

## 2017-01-22 LAB — CBC WITH DIFFERENTIAL/PLATELET
BASOS ABS: 0.1 10*3/uL (ref 0.0–0.2)
Basos: 1 %
EOS (ABSOLUTE): 0.4 10*3/uL (ref 0.0–0.4)
Eos: 6 %
Hematocrit: 41.2 % (ref 37.5–51.0)
Hemoglobin: 13.5 g/dL (ref 13.0–17.7)
IMMATURE GRANS (ABS): 0 10*3/uL (ref 0.0–0.1)
Immature Granulocytes: 0 %
LYMPHS: 27 %
Lymphocytes Absolute: 1.6 10*3/uL (ref 0.7–3.1)
MCH: 29.2 pg (ref 26.6–33.0)
MCHC: 32.8 g/dL (ref 31.5–35.7)
MCV: 89 fL (ref 79–97)
Monocytes Absolute: 0.6 10*3/uL (ref 0.1–0.9)
Monocytes: 10 %
NEUTROS ABS: 3.4 10*3/uL (ref 1.4–7.0)
NEUTROS PCT: 56 %
PLATELETS: 235 10*3/uL (ref 150–379)
RBC: 4.62 x10E6/uL (ref 4.14–5.80)
RDW: 14 % (ref 12.3–15.4)
WBC: 6.1 10*3/uL (ref 3.4–10.8)

## 2017-02-06 DIAGNOSIS — R3915 Urgency of urination: Secondary | ICD-10-CM | POA: Diagnosis not present

## 2017-02-06 DIAGNOSIS — R32 Unspecified urinary incontinence: Secondary | ICD-10-CM | POA: Diagnosis not present

## 2017-03-05 ENCOUNTER — Encounter: Payer: Self-pay | Admitting: Internal Medicine

## 2017-03-05 DIAGNOSIS — I442 Atrioventricular block, complete: Secondary | ICD-10-CM | POA: Diagnosis not present

## 2017-03-11 ENCOUNTER — Other Ambulatory Visit: Payer: Medicare Other

## 2017-03-11 DIAGNOSIS — R300 Dysuria: Secondary | ICD-10-CM

## 2017-03-13 LAB — URINE CULTURE

## 2017-03-22 ENCOUNTER — Ambulatory Visit: Payer: Medicare Other | Admitting: Urology

## 2017-03-29 ENCOUNTER — Ambulatory Visit (INDEPENDENT_AMBULATORY_CARE_PROVIDER_SITE_OTHER): Payer: Medicare Other | Admitting: Urology

## 2017-03-29 DIAGNOSIS — R32 Unspecified urinary incontinence: Secondary | ICD-10-CM | POA: Diagnosis not present

## 2017-03-29 DIAGNOSIS — N320 Bladder-neck obstruction: Secondary | ICD-10-CM | POA: Diagnosis not present

## 2017-03-29 DIAGNOSIS — R351 Nocturia: Secondary | ICD-10-CM

## 2017-03-29 DIAGNOSIS — Z8546 Personal history of malignant neoplasm of prostate: Secondary | ICD-10-CM | POA: Diagnosis not present

## 2017-04-30 ENCOUNTER — Other Ambulatory Visit: Payer: Self-pay

## 2017-04-30 ENCOUNTER — Emergency Department (HOSPITAL_COMMUNITY): Payer: Medicare Other

## 2017-04-30 ENCOUNTER — Encounter (HOSPITAL_COMMUNITY): Payer: Self-pay | Admitting: Emergency Medicine

## 2017-04-30 ENCOUNTER — Emergency Department (HOSPITAL_COMMUNITY)
Admission: EM | Admit: 2017-04-30 | Discharge: 2017-04-30 | Disposition: A | Discharge status: Home / Self Care | Payer: Medicare Other | Attending: Emergency Medicine | Admitting: Emergency Medicine

## 2017-04-30 DIAGNOSIS — E785 Hyperlipidemia, unspecified: Secondary | ICD-10-CM | POA: Diagnosis not present

## 2017-04-30 DIAGNOSIS — R55 Syncope and collapse: Secondary | ICD-10-CM | POA: Insufficient documentation

## 2017-04-30 DIAGNOSIS — E039 Hypothyroidism, unspecified: Secondary | ICD-10-CM | POA: Insufficient documentation

## 2017-04-30 DIAGNOSIS — R262 Difficulty in walking, not elsewhere classified: Secondary | ICD-10-CM | POA: Diagnosis not present

## 2017-04-30 DIAGNOSIS — Z950 Presence of cardiac pacemaker: Secondary | ICD-10-CM | POA: Diagnosis not present

## 2017-04-30 DIAGNOSIS — Z8546 Personal history of malignant neoplasm of prostate: Secondary | ICD-10-CM | POA: Diagnosis not present

## 2017-04-30 DIAGNOSIS — N184 Chronic kidney disease, stage 4 (severe): Secondary | ICD-10-CM | POA: Insufficient documentation

## 2017-04-30 DIAGNOSIS — Z87891 Personal history of nicotine dependence: Secondary | ICD-10-CM | POA: Diagnosis not present

## 2017-04-30 DIAGNOSIS — I2510 Atherosclerotic heart disease of native coronary artery without angina pectoris: Secondary | ICD-10-CM | POA: Insufficient documentation

## 2017-04-30 DIAGNOSIS — R42 Dizziness and giddiness: Secondary | ICD-10-CM | POA: Diagnosis not present

## 2017-04-30 DIAGNOSIS — I442 Atrioventricular block, complete: Secondary | ICD-10-CM | POA: Diagnosis not present

## 2017-04-30 DIAGNOSIS — Z7901 Long term (current) use of anticoagulants: Secondary | ICD-10-CM | POA: Diagnosis not present

## 2017-04-30 DIAGNOSIS — Z79899 Other long term (current) drug therapy: Secondary | ICD-10-CM | POA: Diagnosis not present

## 2017-04-30 DIAGNOSIS — I480 Paroxysmal atrial fibrillation: Secondary | ICD-10-CM | POA: Insufficient documentation

## 2017-04-30 DIAGNOSIS — I129 Hypertensive chronic kidney disease with stage 1 through stage 4 chronic kidney disease, or unspecified chronic kidney disease: Secondary | ICD-10-CM | POA: Insufficient documentation

## 2017-04-30 DIAGNOSIS — R531 Weakness: Secondary | ICD-10-CM | POA: Diagnosis not present

## 2017-04-30 DIAGNOSIS — E860 Dehydration: Secondary | ICD-10-CM | POA: Diagnosis not present

## 2017-04-30 DIAGNOSIS — E119 Type 2 diabetes mellitus without complications: Secondary | ICD-10-CM | POA: Insufficient documentation

## 2017-04-30 DIAGNOSIS — R404 Transient alteration of awareness: Secondary | ICD-10-CM | POA: Diagnosis not present

## 2017-04-30 LAB — TROPONIN I: Troponin I: <0.03 ng/mL (ref <0.03)

## 2017-04-30 LAB — COMPREHENSIVE METABOLIC PANEL
ALBUMIN: 3.7 g/dL (ref 3.5–5.0)
ALK PHOS: 50 U/L (ref 38–126)
ALT: 15 U/L — ABNORMAL LOW (ref 17–63)
AST: 21 U/L (ref 15–41)
Anion gap: 10 (ref 5–15)
BILIRUBIN TOTAL: 0.6 mg/dL (ref 0.3–1.2)
BUN: 27 mg/dL — AB (ref 6–20)
CALCIUM: 9.2 mg/dL (ref 8.9–10.3)
CO2: 28 mmol/L (ref 22–32)
Chloride: 97 mmol/L — ABNORMAL LOW (ref 101–111)
Creatinine, Ser: 1.33 mg/dL — ABNORMAL HIGH (ref 0.61–1.24)
GFR calc Af Amer: 51 mL/min — ABNORMAL LOW (ref >60)
GFR calc non Af Amer: 44 mL/min — ABNORMAL LOW (ref >60)
GLUCOSE: 102 mg/dL — AB (ref 65–99)
POTASSIUM: 4.5 mmol/L (ref 3.5–5.1)
SODIUM: 135 mmol/L (ref 135–145)
TOTAL PROTEIN: 7 g/dL (ref 6.5–8.1)

## 2017-04-30 LAB — CBC WITH DIFFERENTIAL/PLATELET
BASOS ABS: 0 10*3/uL (ref 0.0–0.1)
BASOS PCT: 0 %
EOS ABS: 0.2 10*3/uL (ref 0.0–0.7)
EOS PCT: 3 %
HCT: 41.2 % (ref 39.0–52.0)
Hemoglobin: 13.1 g/dL (ref 13.0–17.0)
LYMPHS PCT: 17 %
Lymphs Abs: 1.1 10*3/uL (ref 0.7–4.0)
MCH: 29.4 pg (ref 26.0–34.0)
MCHC: 31.8 g/dL (ref 30.0–36.0)
MCV: 92.4 fL (ref 78.0–100.0)
MONO ABS: 0.7 10*3/uL (ref 0.1–1.0)
Monocytes Relative: 10 %
NEUTROS ABS: 4.8 10*3/uL (ref 1.7–7.7)
Neutrophils Relative %: 70 %
PLATELETS: 174 10*3/uL (ref 150–400)
RBC: 4.46 MIL/uL (ref 4.22–5.81)
RDW: 14.9 % (ref 11.5–15.5)
WBC: 6.8 10*3/uL (ref 4.0–10.5)

## 2017-04-30 LAB — PROTIME-INR
INR: 0.94
PROTHROMBIN TIME: 12.4 s (ref 11.4–15.2)

## 2017-04-30 LAB — URINALYSIS, ROUTINE W REFLEX MICROSCOPIC
Bilirubin Urine: NEGATIVE
GLUCOSE, UA: NEGATIVE mg/dL
Hgb urine dipstick: NEGATIVE
KETONES UR: NEGATIVE mg/dL
Leukocytes, UA: NEGATIVE
Nitrite: NEGATIVE
PROTEIN: NEGATIVE mg/dL
Specific Gravity, Urine: 1.008 (ref 1.005–1.030)
pH: 7 (ref 5.0–8.0)

## 2017-04-30 MED ORDER — SODIUM CHLORIDE 0.9 % IV BOLUS (SEPSIS)
500.0000 mL | Freq: Once | INTRAVENOUS | Status: AC
Start: 2017-04-30 — End: 2017-04-30
  Administered 2017-04-30: 500 mL via INTRAVENOUS

## 2017-04-30 NOTE — ED Triage Notes (Signed)
PT and daughter reports to EMS generalized weakness, difficulty with ambulation and dizziness 4 days worsening today.

## 2017-04-30 NOTE — ED Notes (Signed)
Multiple attempts to interrogate pace maker. [facility] Rep called, per rep unable to interrogate specific model number, Rep coming to assist.

## 2017-04-30 NOTE — Discharge Instructions (Signed)
As we discussed, we believe her symptoms are caused today by mild volume depletion, or mild dehydration, without any evidence of damage to your body.  Please drink plenty of clear fluids such as water and/or Gatorade and follow up with your regular doctor or the doctors listed in his documentation at the next available opportunity.  Return to the emergency department with any new or worsening symptoms that concern you, including but not limited to fever, shortness of breath, chest pain, or other concerning symptoms. ° ° °Dehydration, Adult °Dehydration is when you lose more fluids from the body than you take in. Vital organs like the kidneys, brain, and heart cannot function without a proper amount of fluids and salt. Any loss of fluids from the body can cause dehydration.  °CAUSES  °Vomiting. °Diarrhea. °Excessive sweating. °Excessive urine output. °Fever. °SYMPTOMS  °Mild dehydration °Thirst. °Dry lips. °Slightly dry mouth. °Moderate dehydration °Very dry mouth. °Sunken eyes. °Skin does not bounce back quickly when lightly pinched and released. °Dark urine and decreased urine production. °Decreased tear production. °Headache. °Severe dehydration °Very dry mouth. °Extreme thirst. °Rapid, weak pulse (more than 100 beats per minute at rest). °Cold hands and feet. °Not able to sweat in spite of heat and temperature. °Rapid breathing. °Blue lips. °Confusion and lethargy. °Difficulty being awakened. °Minimal urine production. °No tears. °DIAGNOSIS  °Your caregiver will diagnose dehydration based on your symptoms and your exam. Blood and urine tests will help confirm the diagnosis. The diagnostic evaluation should also identify the cause of dehydration. °TREATMENT  °Treatment of mild or moderate dehydration can often be done at home by increasing the amount of fluids that you drink. It is best to drink small amounts of fluid more often. Drinking too much at one time can make vomiting worse. Refer to the home care  instructions below. °Severe dehydration needs to be treated at the hospital where you will probably be given intravenous (IV) fluids that contain water and electrolytes. °HOME CARE INSTRUCTIONS  °Ask your caregiver about specific rehydration instructions. °Drink enough fluids to keep your urine clear or pale yellow. °Drink small amounts frequently if you have nausea and vomiting. °Eat as you normally do. °Avoid: °Foods or drinks high in sugar. °Carbonated drinks. °Juice. °Extremely hot or cold fluids. °Drinks with caffeine. °Fatty, greasy foods. °Alcohol. °Tobacco. °Overeating. °Gelatin desserts. °Wash your hands well to avoid spreading bacteria and viruses. °Only take over-the-counter or prescription medicines for pain, discomfort, or fever as directed by your caregiver. °Ask your caregiver if you should continue all prescribed and over-the-counter medicines. °Keep all follow-up appointments with your caregiver. °SEEK MEDICAL CARE IF: °You have abdominal pain and it increases or stays in one area (localizes). °You have a rash, stiff neck, or severe headache. °You are irritable, sleepy, or difficult to awaken. °You are weak, dizzy, or extremely thirsty. °SEEK IMMEDIATE MEDICAL CARE IF:  °You are unable to keep fluids down or you get worse despite treatment. °You have frequent episodes of vomiting or diarrhea. °You have blood or green matter (bile) in your vomit. °You have blood in your stool or your stool looks black and tarry. °You have not urinated in 6 to 8 hours, or you have only urinated a small amount of very dark urine. °You have a fever. °You faint. °MAKE SURE YOU:  °Understand these instructions. °Will watch your condition. °Will get help right away if you are not doing well or get worse. °Document Released: 05/28/2005 Document Revised: 08/20/2011 Document Reviewed: 01/15/2011 °ExitCare® Patient Information ©2015   [employer]. This information is not intended to replace advice given to you by your health  care provider. Make sure you discuss any questions you have with your health care provider. ° °Rehydration, Adult °Rehydration is the replacement of body fluids lost during dehydration. Dehydration is an extreme loss of body fluids to the point of body function impairment. There are many ways extreme fluid loss can occur, including vomiting, diarrhea, or excess sweating. Recovering from dehydration requires replacing lost fluids, continuing to eat to maintain strength, and avoiding foods and beverages that may contribute to further fluid loss or may increase nausea. °HOW TO REHYDRATE °In most cases, rehydration involves the replacement of not only fluids but also carbohydrates and basic body salts. Rehydration with an oral rehydration solution is one way to replace essential nutrients lost through dehydration. °An oral rehydration solution can be purchased at pharmacies, retail stores, and online. Premixed packets of powder that you combine with water to make a solution are also sold. You can prepare an oral rehydration solution at home by mixing the following ingredients together:  ° - tsp table salt. °¾ tsp baking soda. ° tsp salt substitute containing potassium chloride. °1 tablespoons sugar. °1 L (34 oz) of water. °Be sure to use exact measurements. Including too much sugar can make diarrhea worse. °Drink ½-1 cup (120-240 mL) of oral rehydration solution each time you have diarrhea or vomit. If drinking this amount makes your vomiting worse, try drinking smaller amounts more often. For example, drink 1-3 tsp every 5-10 minutes.  °A general rule for staying hydrated is to drink 1½-2 L of fluid per day. Talk to your caregiver about the specific amount you should be drinking each day. Drink enough fluids to keep your urine clear or pale yellow. °EATING WHEN DEHYDRATED °Even if you have had severe sweating or you are having diarrhea, do not stop eating. Many healthy items in a normal diet are okay to continue eating  while recovering from dehydration. The following tips can help you to lessen nausea when you eat: °Ask someone else to prepare your food. Cooking smells may worsen nausea. °Eat in a well-ventilated room away from cooking smells. °Sit up when you eat. Avoid lying down until 1-2 hours after eating. °Eat small amounts when you eat. °Eat foods that are easy to digest. These include soft, well-cooked, or mashed foods. °FOODS AND BEVERAGES TO AVOID °Avoid eating or drinking the following foods and beverages that may increase nausea or further loss of fluid:  °Fruit juices with a high sugar content, such as concentrated juices. °Alcohol. °Beverages containing caffeine. °Carbonated drinks. They may cause a lot of gas. °Foods that may cause a lot of gas, such as cabbage, broccoli, and beans. °Fatty, greasy, and fried foods. °Spicy, very salty, and very sweet foods or drinks. °Foods or drinks that are very hot or very cold. Consume food or drinks at or near room temperature. °Foods that need a lot of chewing, such as raw vegetables. °Foods that are sticky or hard to swallow, such as peanut butter. °Document Released: 08/20/2011 Document Revised: 02/20/2012 Document Reviewed: 08/20/2011 °ExitCare® Patient Information ©2015 [employer]. This information is not intended to replace advice given to you by your health care provider. Make sure you discuss any questions you have with your health care provider. ° ° ° °

## 2017-04-30 NOTE — ED Notes (Signed)
Rep from [facility] in room to interrogate pace maker.

## 2017-04-30 NOTE — ED Provider Notes (Signed)
Emergency Department Provider Note   I have reviewed the triage vital signs and the nursing notes.   HISTORY  Chief Complaint Dizziness   HPI [name] is a 83 y.o. male with PMH of CAD, complete heart block with St Jude pacemaker in place, DM, h/o prostate cancer, HTN, and HLD presents to the emergency department for evaluation of generalized weakness, near syncope, difficulty with ambulation.  Patient reports at least one year of occasional lightheadedness and generalized weakness but today states it became much worse.  States that typically he is ambulatory with a walker and has a home health aide that comes every 2 days for assistance.  States that typically his symptoms are mild and he has to sit on the edge of the bed before standing.  Today he was sitting at the kitchen table when he suddenly felt very lightheaded.  He was not attempting to stand or change positions at that time.  He denies any vertigo sensation.  He had nausea but denies any vomiting.  No chest pain, palpitations, dyspnea.  No new medications.  Patient states he is followed with his primary care physician for these lightheaded symptoms in the past with no clear reason found to explain the symptoms.  Patient denies any recent falls but family member states he did have a fall approximately 2 months ago.  Patient is on Eliquis but denies any HA. No unilateral weakness or numbness.    Past Medical History:  Diagnosis Date  . Blindness of right eye    Related to trauma in [place]  . CAD (coronary artery disease)    (non Q wave MI in Jan 2009. He had chronically ocluded right coronary artery. He had a LAD w/90% stenosis at the take off of the first diagona. He had a drug-eluding stent palced by Dr Burt Knack)  . Cancer [facility])    prostate ca with radiation in 2005  . Cellulitis of face feb 2011   prostetic rt eye, cellulitis of rt side of face  . Complete heart block (HCC)    Zephyr dual chamber pacemaker per Dr Caryl Comes  .  Diabetes mellitus without complication ([employer])   . Dyslipidemia   . Hypertension   . Hypothyroid   . Macular degeneration     Patient Active Problem List   Diagnosis Date Noted  . OAB (overactive bladder) 05/08/2016  . Atherosclerotic PVD with ulceration ([place]) 11/23/2013  . Chronic ulcer of right great toe ([place]) 11/23/2013  . Depression 10/02/2013  . Anxiety state 10/02/2013  . Foot ulcer, right ([place]) 10/02/2013  . Heart burn 06/02/2013  . Cough 06/02/2013  . Chronic kidney disease (CKD), stage IV (severe) ([place]) 09/30/2012  . CAD (coronary artery disease) of artery bypass graft 09/30/2012  . Hypothyroidism 09/30/2012  . DM (diabetes mellitus) ([place]) 09/30/2012  . Atrial fibrillation-paroxysmal 09/12/2010  . Coronary artery disease- Status post drug-eluting stent 09/12/2010  . Hyperlipidemia 02/02/2010  . AV BLOCK, COMPLETE 02/02/2010  . ORTHOSTATIC DIZZINESS 02/02/2010  . DYSPNEA ON EXERTION 02/02/2010  . Pacemaker-St. Jude 02/02/2010    Past Surgical History:  Procedure Laterality Date  . eye removed Right 1997    Current Outpatient Rx  . Order #: [id number] Class: Print  . Order #: [id number] Class: Normal  . Order #: [id number] Class: Historical Med  . Order #: [id number] Class: Normal  . Order #: [id number] Class: Historical Med  . Order #: [id number] Class: Historical Med  . Order #: [id number] Class: Normal  . Order #: [id number] Class: Historical Med  .  Order #: [id number] Class: Normal    Allergies Patient has no known allergies.  History reviewed. No pertinent family history.  Social History Social History   Tobacco Use  . Smoking status: Former Smoker    Packs/day: 2.00    Years: 65.00    Pack years: 130.00    Types: Cigarettes    Last attempt to quit: 09/11/2000    Years since quitting: 16.6  . Smokeless tobacco: Never Used  Substance Use Topics  . Alcohol use: Yes    Alcohol/week: 0.6 oz    Types: 1 Cans of beer per week    Comment: occ  . Drug use: No     Review of Systems  Constitutional: No fever/chills. Positive generalized weakness.  Eyes: No visual changes. ENT: No sore throat. Cardiovascular: Denies chest pain. Positive near syncope.  Respiratory: Denies shortness of breath. Gastrointestinal: No abdominal pain.  No nausea, no vomiting.  No diarrhea.  No constipation. Genitourinary: Negative for dysuria. Musculoskeletal: Negative for back pain. Skin: Negative for rash. Neurological: Negative for headaches, focal weakness or numbness. 10-point ROS otherwise negative.  ____________________________________________   PHYSICAL EXAM:  VITAL SIGNS: ED Triage Vitals [04/30/17 1521]  Enc Vitals Group     BP (!) 158/89     Pulse Rate 78     Resp 17     Temp 97.9 F (36.6 C)     Temp Source Oral     SpO2 95 %     Weight 205 lb (93 kg)     Height 6\' 1"  (1.854 m)   Constitutional: Alert and oriented. Well appearing and in no acute distress. Eyes: Conjunctivae are normal. No eye on the right s/p remote injury. Finger/shaddow vision on the left (bhaseline).  Head: Atraumatic. Nose: No congestion/rhinnorhea. Mouth/Throat: Mucous membranes are slightly dry.  Neck: No stridor.   Cardiovascular: Normal rate, regular rhythm. Good peripheral circulation. Grossly normal heart sounds.   Respiratory: Normal respiratory effort.  No retractions. Lungs CTAB. Gastrointestinal: Soft and nontender. No distention.  Musculoskeletal: No lower extremity tenderness nor edema. No gross deformities of extremities. Normal passive and active ROM of bilateral hips.  Neurologic:  Normal speech and language. No gross focal neurologic deficits are appreciated. No facial asymmetry. No pronator drift. Normal strength and sensation throughout.  Skin:  Skin is warm, dry and intact. No rash noted.  ____________________________________________   LABS (all labs ordered are listed, but only abnormal results are displayed)  Labs Reviewed  COMPREHENSIVE  METABOLIC PANEL - Abnormal; Notable for the following components:      Result Value   Chloride 97 (*)    Glucose, Bld 102 (*)    BUN 27 (*)    Creatinine, Ser 1.33 (*)    ALT 15 (*)    GFR calc non Af Amer 44 (*)    GFR calc Af Amer 51 (*)    All other components within normal limits  URINALYSIS, ROUTINE W REFLEX MICROSCOPIC - Abnormal; Notable for the following components:   Color, Urine STRAW (*)    All other components within normal limits  URINE CULTURE  CBC WITH DIFFERENTIAL/PLATELET  TROPONIN I  PROTIME-INR   ____________________________________________  EKG   EKG Interpretation  Date/Time:  Tuesday April 30 2017 15:24:02 EST Ventricular Rate:  82 PR Interval:    QRS Duration: 137 QT Interval:  461 QTC Calculation: 539 R Axis:   -71 Text Interpretation:  Ventricular-paced complexes No further analysis attempted due to paced rhythm No STEMI.  Confirmed by  Nanda Quinton ([id number]) on 04/30/2017 3:29:39 PM       ____________________________________________  RADIOLOGY  Dg Chest 2 View  Result Date: 04/30/2017 CLINICAL DATA:  Generalized weakness, difficulty with ambulation and dizziness for 4 days worsened today EXAM: CHEST  2 VIEW COMPARISON:  11/29/2015 FINDINGS: LEFT subclavian sequential pacemaker leads project at RIGHT atrium and RIGHT ventricle, unchanged. Enlargement of cardiac silhouette. Atherosclerotic calcification aorta. Mediastinal contours and pulmonary vascularity normal. Lungs appear hyperinflated but clear. No acute infiltrate, pleural effusion or pneumothorax. Bones demineralized with BILATERAL AC joint degenerative changes. IMPRESSION: Mild enlargement of cardiac silhouette. Suspected COPD changes without acute infiltrate. Electronically Signed   By: Lavonia Dana M.D.   On: 04/30/2017 16:57   Ct Head Wo Contrast  Result Date: 04/30/2017 CLINICAL DATA:  Generalized weakness, difficulty with ambulation, and dizziness for 4 days worsened today, history  blindness RIGHT eye, coronary artery disease, prostate cancer, diabetes mellitus, hypertension, former smoker EXAM: CT HEAD WITHOUT CONTRAST TECHNIQUE: Contiguous axial images were obtained from the base of the skull through the vertex without intravenous contrast. COMPARISON:  11/29/2015 FINDINGS: Brain: Generalized atrophy. Normal ventricular morphology. No midline shift or mass effect. Small vessel chronic ischemic changes of deep cerebral white matter. No intracranial hemorrhage, mass lesion, evidence of acute infarction, or extra-axial fluid collection. Vascular: Atherosclerotic calcification of internal carotid and vertebral arteries at skullbase Skull: Intact Sinuses/Orbits: Visualized paranasal sinuses and mastoid air cells clear. RIGHT ocular prosthesis. Other: N/A IMPRESSION: Atrophy with small vessel chronic ischemic changes of deep cerebral white matter. No acute intracranial abnormalities. Electronically Signed   By: Lavonia Dana M.D.   On: 04/30/2017 16:56    ____________________________________________   PROCEDURES  Procedure(s) performed:   Procedures  None ____________________________________________   INITIAL IMPRESSION / ASSESSMENT AND PLAN / ED COURSE  Pertinent labs & imaging results that were available during my care of the patient were reviewed by me and considered in my medical decision making (see chart for details).  Patient presents to the emergency department for evaluation of generalized weakness over the past year but new onset near syncope today with difficulty walking.  The patient has no focal neurological findings to suggest stroke.  He did have a fall 2 months ago and is on Eliquis.  Plan for CT to rule out subacute subdural hematoma.  Patient with no infection symptoms.  No new medications.  Plan for labs to evaluate for anemia versus electrolyte abnormality or acute kidney injury.   Imaging, interrogation of pacemaker, and labs are unremarkable. Will follow  with PCP and Cardiology regarding symptoms.   At this time, I do not feel there is any life-threatening condition present. I have reviewed and discussed all results (EKG, imaging, lab, urine as appropriate), exam findings with patient. I have reviewed nursing notes and appropriate previous records.  I feel the patient is safe to be discharged home without further emergent workup. Discussed usual and customary return precautions. Patient and family (if present) verbalize understanding and are comfortable with this plan.  Patient will follow-up with their primary care provider. If they do not have a primary care provider, information for follow-up has been provided to them. All questions have been answered.  ____________________________________________  FINAL CLINICAL IMPRESSION(S) / ED DIAGNOSES  Final diagnoses:  Near syncope  Dehydration  Difficulty walking     MEDICATIONS GIVEN DURING THIS VISIT:  Medications  sodium chloride 0.9 % bolus 500 mL (0 mLs Intravenous Stopped 04/30/17 1709)    Note:  This document was  prepared using Systems analyst and may include unintentional dictation errors.  Nanda Quinton, MD Emergency Medicine    Travares Nelles, [facility], MD 05/01/17 1014

## 2017-04-30 NOTE — ED Notes (Signed)
[name] with [employer] to nurses station, reports pace maker was interrogated and cks out with no issues, relayed to EDP

## 2017-05-02 LAB — URINE CULTURE: Special Requests: NORMAL

## 2017-05-08 ENCOUNTER — Ambulatory Visit: Payer: Medicare Other | Admitting: Family Medicine

## 2017-05-13 ENCOUNTER — Ambulatory Visit: Payer: Medicare Other | Admitting: Family Medicine

## 2017-05-22 ENCOUNTER — Telehealth: Payer: Self-pay | Admitting: Internal Medicine

## 2017-05-22 NOTE — Telephone Encounter (Signed)
Patient with diagnosis of  Atrial fibrillation on Eliquis for anticoagulation.    Procedure: Thulium Laser of Prostate Date of procedure: TBD  CHADS2-VASc score of  6 (CHF, HTN, AGE 83yo, DM2, CAD) *No history of stroke/TIA or VTE noted during chart review*  CrCl = 39 ml/min Platelet count = 174 on 04-30-17  Per office protocol, patient can hold Eliquis for 2 days prior to procedure.  Patient will not need bridging with Lovenox (enoxaparin) around procedure.  Patient should restart Eliquis on the evening of procedure or day after, at discretion of procedure MD.  For orthopedic procedures please be sure to resume therapeutic (not prophylactic) dosing.   [name] PharmD, BCPS, [place] [id number] 05/22/2017 4:41 PM

## 2017-05-22 NOTE — Telephone Encounter (Signed)
° °  [facility] Pre-operative Risk Assessment    Request for surgical clearance:  1. What type of surgery is being performed?Thulium Laser of the Prostate   2. When is this surgery scheduled? Pending   3. Are there any medications that need to be held prior to surgery and how long?Can pt stop Xarelto 3 days prior to surgery?   4. Practice name and name of physician performing surgery? Dr Irine Seal    5. What is your office phone and fax number?[phone] and fax is [phone]   6. Anesthesia type (None, local, MAC, general) ? Choice   [name] 05/22/2017, 2:45 PM  _________________________________________________________________   (provider comments below)

## 2017-05-23 NOTE — Telephone Encounter (Signed)
   Primary Cardiologist:No primary care provider on file.  Chart reviewed as part of pre-operative protocol coverage. Because of [name]'s past medical history and recent ED visit for presyncope, he will require a follow-up visit in order to better assess preoperative cardiovascular risk. Pharmacy has already provided input on the patient holding his Eliquis. This can be reviewed with him at his appointment.   Pre-op covering staff: - Please schedule appointment and call patient to inform them. - Please contact requesting surgeon's office via preferred method (i.e, phone, fax) to inform them of need for appointment prior to surgery.  Christell Faith, PA-C  05/23/2017, 4:30 PM

## 2017-05-24 ENCOUNTER — Ambulatory Visit (INDEPENDENT_AMBULATORY_CARE_PROVIDER_SITE_OTHER): Payer: Medicare Other | Admitting: Family Medicine

## 2017-05-24 ENCOUNTER — Ambulatory Visit (INDEPENDENT_AMBULATORY_CARE_PROVIDER_SITE_OTHER): Payer: Medicare Other

## 2017-05-24 ENCOUNTER — Encounter: Payer: Self-pay | Admitting: Family Medicine

## 2017-05-24 VITALS — BP 144/73 | HR 77 | Temp 97.2°F

## 2017-05-24 DIAGNOSIS — R630 Anorexia: Secondary | ICD-10-CM

## 2017-05-24 DIAGNOSIS — F411 Generalized anxiety disorder: Secondary | ICD-10-CM | POA: Diagnosis not present

## 2017-05-24 DIAGNOSIS — E039 Hypothyroidism, unspecified: Secondary | ICD-10-CM

## 2017-05-24 DIAGNOSIS — E1122 Type 2 diabetes mellitus with diabetic chronic kidney disease: Secondary | ICD-10-CM | POA: Diagnosis not present

## 2017-05-24 DIAGNOSIS — I25810 Atherosclerosis of coronary artery bypass graft(s) without angina pectoris: Secondary | ICD-10-CM

## 2017-05-24 DIAGNOSIS — R509 Fever, unspecified: Secondary | ICD-10-CM

## 2017-05-24 LAB — BAYER DCA HB A1C WAIVED: HB A1C: 5.9 % (ref <7.0)

## 2017-05-24 MED ORDER — ALPRAZOLAM 0.5 MG PO TABS: 0.5000 mg | ORAL_TABLET | Freq: Every evening | ORAL | 0 refills | Status: DC | PRN

## 2017-05-24 NOTE — Patient Instructions (Signed)
Great to see you!  We will contact you with lab results within 1 week.   Come back in 1 month for a memory test.   Please seek immediate medical attention for any worsening symptoms.

## 2017-05-24 NOTE — Progress Notes (Signed)
   HPI  Patient presents today with acute illness.  Patient needs to return in about 1 month for MMSE.  There is some concern for memory loss.  Family explains that he has had a fever measured at 100.?  About 3 days ago, he states that he has had a cough which improves easily with throat lozenges and has not persisted throughout today. They also stated over the last 3 days he has been calling them and stating that he "feels terrible". Pressure was 97.6 later that day. No sore throat, increased urination, or other signs of infection that he can identify.  He has several months of decreased appetite, decreased energy. They requested liquid medication that his wife took, however they do not remember the medications name.  Anxiety is stable, refilled Xanax today   PMH: Smoking status noted ROS: Per HPI  Objective: BP (!) 144/73   Pulse 77   Temp (!) 97.2 F (36.2 C) (Oral)   SpO2 98%  Gen: NAD, alert, cooperative with exam HEENT: NCAT CV: RRR, good S1/S2, no murmur Resp: Nonlabored, coarse breath sounds on expiration on the left lower lung field but also scattered.  Good air movement Ext: No edema, warm Neuro: Alert and oriented, No gross deficits  Assessment and plan:  #Fever Exam very reassuring, patient appears well today. Reported fever of 100 on one occasion, no other associated symptoms except for "feeling terrible" and a cough which she describes himself as mild and resolved with 1 throat lozenge. Workup prior to antibiotics in this case, chest x-ray, labs and urine pending. Low threshold for seeking further medical evaluation.  #type 2 diabetes Continue current medications, checking A1c which is pending.  Previously well controlled  #Anxiety Refill Xanax, stable  #Hypothyroidism Patient with decreased energy and decreased appetite, rechecking TSH.  Decreased appetite Long-term problem for several months Recommended Remeron, I believe the liquid medicine the  family is describing as Megace, I explained that I believe that due to his age and comorbidities Megace is a poor choice for appetite stimulation.     Orders Placed This Encounter  Procedures  . Urine Culture  . DG Chest 2 View    Standing Status:   Future    Standing Expiration Date:   07/25/2018    Order Specific Question:   Reason for Exam (SYMPTOM  OR DIAGNOSIS REQUIRED)    Answer:   eval for CAP    Order Specific Question:   Preferred imaging location?    Answer:   Internal    Order Specific Question:   Radiology Contrast Protocol - do NOT remove file path    Answer:   [email or web address]  . CBC with Differential/Platelet  . CMP14+EGFR  . Urinalysis, Complete  . TSH  . Bayer DCA Hb A1c Waived    Meds ordered this encounter  Medications  . ALPRAZolam (XANAX) 0.5 MG tablet    Sig: Take 1 tablet (0.5 mg total) by mouth at bedtime as needed. for sleep    Dispense:  90 tablet    Refill:  0    Please do not fill until 90 days after previous fill    Laroy Apple, MD [facility] 05/24/2017, 10:33 AM

## 2017-05-25 LAB — CBC WITH DIFFERENTIAL/PLATELET
BASOS: 0 %
Basophils Absolute: 0 10*3/uL (ref 0.0–0.2)
EOS (ABSOLUTE): 0.1 10*3/uL (ref 0.0–0.4)
EOS: 1 %
HEMOGLOBIN: 11.3 g/dL — AB (ref 13.0–17.7)
Hematocrit: 34.9 % — ABNORMAL LOW (ref 37.5–51.0)
IMMATURE GRANS (ABS): 0 10*3/uL (ref 0.0–0.1)
IMMATURE GRANULOCYTES: 1 %
LYMPHS: 14 %
Lymphocytes Absolute: 1.1 10*3/uL (ref 0.7–3.1)
MCH: 28.6 pg (ref 26.6–33.0)
MCHC: 32.4 g/dL (ref 31.5–35.7)
MCV: 88 fL (ref 79–97)
MONOCYTES: 10 %
Monocytes Absolute: 0.8 10*3/uL (ref 0.1–0.9)
NEUTROS ABS: 5.8 10*3/uL (ref 1.4–7.0)
NEUTROS PCT: 74 %
Platelets: 360 10*3/uL (ref 150–379)
RBC: 3.95 x10E6/uL — ABNORMAL LOW (ref 4.14–5.80)
RDW: 14.6 % (ref 12.3–15.4)
WBC: 7.8 10*3/uL (ref 3.4–10.8)

## 2017-05-25 LAB — CMP14+EGFR
A/G RATIO: 1.2 (ref 1.2–2.2)
ALBUMIN: 3.8 g/dL (ref 3.2–4.6)
ALT: 9 IU/L (ref 0–44)
AST: 14 IU/L (ref 0–40)
Alkaline Phosphatase: 53 IU/L (ref 39–117)
BILIRUBIN TOTAL: 0.3 mg/dL (ref 0.0–1.2)
BUN / CREAT RATIO: 16 (ref 10–24)
BUN: 20 mg/dL (ref 10–36)
CALCIUM: 9 mg/dL (ref 8.6–10.2)
CHLORIDE: 99 mmol/L (ref 96–106)
CO2: 24 mmol/L (ref 20–29)
Creatinine, Ser: 1.23 mg/dL (ref 0.76–1.27)
GFR, EST AFRICAN AMERICAN: 58 mL/min/{1.73_m2} — AB (ref >59)
GFR, EST NON AFRICAN AMERICAN: 50 mL/min/{1.73_m2} — AB (ref >59)
Globulin, Total: 3.1 g/dL (ref 1.5–4.5)
Glucose: 100 mg/dL — ABNORMAL HIGH (ref 65–99)
Potassium: 4.4 mmol/L (ref 3.5–5.2)
Sodium: 137 mmol/L (ref 134–144)
TOTAL PROTEIN: 6.9 g/dL (ref 6.0–8.5)

## 2017-05-25 LAB — TSH: TSH: 11.51 u[IU]/mL — ABNORMAL HIGH (ref 0.450–4.500)

## 2017-05-27 ENCOUNTER — Other Ambulatory Visit: Payer: Medicare Other

## 2017-05-27 DIAGNOSIS — R509 Fever, unspecified: Secondary | ICD-10-CM | POA: Diagnosis not present

## 2017-05-27 LAB — MICROSCOPIC EXAMINATION
Epithelial Cells (non renal): NONE SEEN /hpf (ref 0–10)
RBC, UA: NONE SEEN /hpf (ref 0–?)
RENAL EPITHEL UA: NONE SEEN /HPF

## 2017-05-27 LAB — URINALYSIS, COMPLETE
BILIRUBIN UA: NEGATIVE
GLUCOSE, UA: NEGATIVE
KETONES UA: NEGATIVE
LEUKOCYTES UA: NEGATIVE
Nitrite, UA: NEGATIVE
PROTEIN UA: NEGATIVE
RBC UA: NEGATIVE
SPEC GRAV UA: 1.015 (ref 1.005–1.030)
UUROB: 0.2 mg/dL (ref 0.2–1.0)
pH, UA: 6 (ref 5.0–7.5)

## 2017-05-28 ENCOUNTER — Other Ambulatory Visit: Payer: Self-pay

## 2017-05-28 DIAGNOSIS — D649 Anemia, unspecified: Secondary | ICD-10-CM

## 2017-05-28 MED ORDER — LEVOTHYROXINE SODIUM 175 MCG PO TABS: 175.0000 ug | ORAL_TABLET | Freq: Every day | ORAL | 1 refills | Status: DC

## 2017-05-28 NOTE — Addendum Note (Signed)
Addended by: Nigel Berthold C on: 05/28/2017 11:49 AM   Modules accepted: Orders

## 2017-05-29 LAB — URINE CULTURE: ORGANISM ID, BACTERIA: NO GROWTH

## 2017-05-30 ENCOUNTER — Encounter: Payer: Self-pay | Admitting: Family Medicine

## 2017-05-30 ENCOUNTER — Ambulatory Visit (INDEPENDENT_AMBULATORY_CARE_PROVIDER_SITE_OTHER): Payer: Medicare Other | Admitting: Family Medicine

## 2017-05-30 VITALS — BP 139/67 | HR 97 | Temp 96.6°F | Ht 73.0 in | Wt 201.6 lb

## 2017-05-30 DIAGNOSIS — D649 Anemia, unspecified: Secondary | ICD-10-CM

## 2017-05-30 DIAGNOSIS — R5382 Chronic fatigue, unspecified: Secondary | ICD-10-CM | POA: Diagnosis not present

## 2017-05-30 DIAGNOSIS — I25810 Atherosclerosis of coronary artery bypass graft(s) without angina pectoris: Secondary | ICD-10-CM | POA: Diagnosis not present

## 2017-05-30 NOTE — Progress Notes (Signed)
   HPI  Patient presents today here for follow-up of fatigue.  Patient was seen for fever and fatigue about 1 week ago.  Fever was measured at home and was over 100.0. There are no discrete signs of infection, however they did report some cough. Chest x-ray and urinalysis were negative for pneumonia and UTI respectively. There is no leukocytosis found And normocytic anemia was found that was mild. Patient states that he feels the same, he states that his symptoms are going on for years. His aide is present and states that he is been eating better than usual and appears to have more strength and energy than usual.  PMH: Smoking status noted ROS: Per HPI  Objective: BP 139/67   Pulse 97   Temp (!) 96.6 F (35.9 C) (Oral)   Ht 6\' 1"  (1.854 m)   Wt 201 lb 9.6 oz (91.4 kg)   SpO2 97%   BMI 26.60 kg/m  Gen: NAD, alert, cooperative with exam HEENT: NCAT, right eye socket with clear discharge, no purulence CV: RRR, good S1/S2, no murmur Resp: CTABL, no wheezes, non-labored Abd: SNTND, BS present, no guarding or organomegaly Ext: No edema, warm Neuro: Alert and oriented, No gross deficits  Assessment and plan:  #Chronic fatigue Patient with slightly elevated TSH, Synthroid has been adjusted. Repeat labs in about 2 months. No other etiology seen, although likely multifactorial  #Normocytic anemia Mild, repeat CBC Continue to monitor   Carlos Apple, MD [facility] 05/30/2017, 2:11 PM

## 2017-05-30 NOTE — Patient Instructions (Signed)
Great to see you!  You look like you have more energy than you have the last several times I have seen you .   I have checked your blood counts again, they were slightly low in the last visit.  I do not see any reasons to give you antibiotics at this time.

## 2017-05-30 NOTE — Addendum Note (Signed)
Addended by: Liliane Bade on: 05/30/2017 02:16 PM   Modules accepted: Orders

## 2017-05-31 LAB — CBC WITH DIFFERENTIAL/PLATELET
BASOS ABS: 0 10*3/uL (ref 0.0–0.2)
BASOS: 1 %
EOS (ABSOLUTE): 0.1 10*3/uL (ref 0.0–0.4)
Eos: 1 %
HEMOGLOBIN: 12.3 g/dL — AB (ref 13.0–17.7)
Hematocrit: 38.2 % (ref 37.5–51.0)
IMMATURE GRANS (ABS): 0 10*3/uL (ref 0.0–0.1)
IMMATURE GRANULOCYTES: 0 %
LYMPHS: 17 %
Lymphocytes Absolute: 1.4 10*3/uL (ref 0.7–3.1)
MCH: 28.8 pg (ref 26.6–33.0)
MCHC: 32.2 g/dL (ref 31.5–35.7)
MCV: 90 fL (ref 79–97)
MONOCYTES: 10 %
Monocytes Absolute: 0.8 10*3/uL (ref 0.1–0.9)
NEUTROS ABS: 5.8 10*3/uL (ref 1.4–7.0)
NEUTROS PCT: 71 %
PLATELETS: 420 10*3/uL — AB (ref 150–379)
RBC: 4.27 x10E6/uL (ref 4.14–5.80)
RDW: 14.7 % (ref 12.3–15.4)
WBC: 8.1 10*3/uL (ref 3.4–10.8)

## 2017-06-05 ENCOUNTER — Encounter: Payer: Self-pay | Admitting: Internal Medicine

## 2017-06-05 DIAGNOSIS — I442 Atrioventricular block, complete: Secondary | ICD-10-CM | POA: Diagnosis not present

## 2017-06-12 ENCOUNTER — Telehealth: Payer: Self-pay | Admitting: Cardiology

## 2017-06-12 NOTE — Telephone Encounter (Signed)
Opened in error

## 2017-06-14 ENCOUNTER — Encounter: Payer: Self-pay | Admitting: Physician Assistant

## 2017-06-14 ENCOUNTER — Encounter (INDEPENDENT_AMBULATORY_CARE_PROVIDER_SITE_OTHER): Payer: Self-pay

## 2017-06-14 ENCOUNTER — Ambulatory Visit (INDEPENDENT_AMBULATORY_CARE_PROVIDER_SITE_OTHER): Payer: Medicare Other | Admitting: Physician Assistant

## 2017-06-14 VITALS — BP 132/60 | HR 78 | Ht 73.0 in | Wt 204.0 lb

## 2017-06-14 DIAGNOSIS — I2510 Atherosclerotic heart disease of native coronary artery without angina pectoris: Secondary | ICD-10-CM

## 2017-06-14 DIAGNOSIS — I10 Essential (primary) hypertension: Secondary | ICD-10-CM

## 2017-06-14 DIAGNOSIS — Z01818 Encounter for other preprocedural examination: Secondary | ICD-10-CM

## 2017-06-14 DIAGNOSIS — Z950 Presence of cardiac pacemaker: Secondary | ICD-10-CM

## 2017-06-14 DIAGNOSIS — I4821 Permanent atrial fibrillation: Secondary | ICD-10-CM

## 2017-06-14 DIAGNOSIS — I482 Chronic atrial fibrillation: Secondary | ICD-10-CM

## 2017-06-14 NOTE — Patient Instructions (Addendum)
Medication Instructions:   Your physician recommends that you continue on your current medications as directed. Please refer to the Current Medication list given to you today.   If you need a refill on your cardiac medications before your next appointment, please call your pharmacy.  Labwork: NONE ORDERED  TODAY    Testing/Procedures: NONE ORDERED  TODAY    Follow-Up:  IN 3 MONTHS WITH KLEIN   IN ONE MONTH WITH DR Caryl Comes      Any Other Special Instructions Will Be Listed Below (If Applicable).

## 2017-06-14 NOTE — Progress Notes (Signed)
Cardiology Office Note Date:  06/14/2017  Patient ID:  [name], [name] 02-28-1922, MRN [id number] PCP:  [name], MD  Electrophysiologist: Dr. Caryl Comes   Chief Complaint: pre-op  History of Present Illness: [name] is a 84 y.o. male with history of [place] w/PPM, CAD w/known chronically occluded RCA, remote PC to LAD in 2009, persistent AFib, hypothyroidism, HTN, HLD, chronic diastolic CHF is mentioned.  He comes today to be seen for Dr. Caryl Comes, last seen by him in August, described as largely non-ambulatory, no anginal sounding complaints, only c/w polyuria, no changes were made to his therapy at that visit.   He was evaluated in the ER 04/30/17 with a near syncopal event that apparently happened while seated, not during position change.  In review of record, reportedly his pacer was checked, labs and brain imaging were done, all "unremarkable", he was discharged is appears suspect 2/2 dehydration.  He is being seen for pre-operative cardiac evaluation today.  His last echo was 07/07/07,  LVEF 60%, Dr. Caryl Comes reports a myoview done in 2013 negative for ischemia, LV function 60%  He comes today accompanied by his son.  They both report that the patient is not likely to pursue prostate procsdure, being done for increased urinary incontinance through the night.  I asked about his recent ER visit, bot the patient and son report for the last few years, he has required the wheelchair for sever gait/nbalance problems, and fear of falling.  The day of his ER visit he was seated and felt very off balance, perhaps like fainting, though didn't.  His son states the patient said was a "worse then usual" episode.  He was seated, stated felt like he wa falling over, unclear though if felt like fainting.  They both report that this issu has been evaluated extensively over the last couple years without clear cause, has occurred seated, though particularly when standing/ambulating.  He is not having  any CP, palpitations or SOB, no symptoms of PND or orthopnea.   RCRI score is 6.6% DUKE (DASI): unable to establish, patient is essentially wheelchair bound  Device information SJM dual chamber PPM, implanted 07/08/2007, programmed VCIR  Past Medical History:  Diagnosis Date  . Blindness of right eye    Related to trauma in [place]  . CAD (coronary artery disease)    (non Q wave MI in Jan 2009. He had chronically ocluded right coronary artery. He had a LAD w/90% stenosis at the take off of the first diagona. He had a drug-eluding stent palced by Dr Burt Knack)  . Cancer [facility])    prostate ca with radiation in 2005  . Cellulitis of face feb 2011   prostetic rt eye, cellulitis of rt side of face  . Complete heart block (HCC)    Zephyr dual chamber pacemaker per Dr Caryl Comes  . Diabetes mellitus without complication ([place])   . Dyslipidemia   . Hypertension   . Hypothyroid   . Macular degeneration     Past Surgical History:  Procedure Laterality Date  . eye removed Right 1997    Current Outpatient Medications  Medication Sig Dispense Refill  . ALPRAZolam (XANAX) 0.5 MG tablet Take 1 tablet (0.5 mg total) by mouth at bedtime as needed. for sleep 90 tablet 0  . apixaban (ELIQUIS) 2.5 MG TABS tablet Take 1 tablet (2.5 mg total) by mouth 2 (two) times daily. 180 tablet 5  . Cholecalciferol (VITAMIN D PO) Take 1 tablet by mouth daily.    Marland Kitchen  glucose blood test strip Test BS BID and PRN. Dx e11.9 300 each 3  . levothyroxine (SYNTHROID) 175 MCG tablet Take 1 tablet (175 mcg total) by mouth daily before breakfast. 90 tablet 1  . levothyroxine (SYNTHROID, LEVOTHROID) 150 MCG tablet Take 1 tablet (150 mcg total) by mouth daily. 90 tablet 3  . MELATONIN PO Take 1-2 tablets by mouth at bedtime.     . mirabegron ER (MYRBETRIQ) 25 MG TB24 tablet Take 25 mg by mouth daily.     . nitroGLYCERIN (NITROSTAT) 0.4 MG SL tablet Place 1 tablet (0.4 mg total) under the tongue every 5 (five) minutes as needed for  chest pain (Do not exceed 3 doses). 25 tablet 3  . Omega-3 Fatty Acids (FISH OIL PO) Take 1-2 capsules by mouth daily.     No current facility-administered medications for this visit.     Allergies:   Patient has no known allergies.   Social History:  The patient  reports that he quit smoking about 16 years ago. His smoking use included cigarettes. He has a 130.00 pack-year smoking history. he has never used smokeless tobacco. He reports that he drinks about 0.6 oz of alcohol per week. He reports that he does not use drugs.   Family History:  The patient's family history includes Heart disease in his father.  ROS:  Please see the history of present illness.  All other systems are reviewed and otherwise negative.   PHYSICAL EXAM:  VS:  BP 132/60   Pulse 78   Ht 6\' 1"  (1.854 m)   Wt 204 lb (92.5 kg)   BMI 26.91 kg/m  BMI: Body mass index is 26.91 kg/m. Well nourished, well developed, in no acute distress  HEENT: normocephalic, atraumatic, R eye is a prosthesis, chronic lower lid especially ptosis, and infraorbital discoloration Neck: no JVD, carotid bruits or masses Cardiac: RRR; 1/6 SM, no rubs, or gallops Lungs:  CTA b/l, no wheezing, rhonchi or rales  Abd: soft, nontender MS: no deformity, age appropriate atrophy Ext:  no edema  Skin: warm and dry, no rash Neuro:  No gross deficits appreciated Psych: euthymic mood, full affect  PPM site is stable, no tethering or discomfort    EKG:  Done today and reviewed by myself is AFib, V paced PPM interrogation done today and reviewed by myself: battery is nearing ERI, estimated 3 months (0.25 year), lead measurements are good, >99% V paced, underlying is AF, VS 40's  07/07/07: TTE SUMMARY - Overall left ventricular systolic function was normal. Left    ventricular ejection fraction was estimated to be 60 %. This    study was inadequate for the evaluation of left ventricular    regional wall motion. Left ventricular  wall thickness was    moderately increased. - The mitral inflow signal is difficult to interpret. There may be    suggestion of restrictive physiology. - There is question of a target in the RV in diastole. - There is question of pericardial fluid posteriorly and    anteriorly. I can not tell if this is fat padd or fluid or    fibrinous fluid. - The images are difficult. There is question of a mass in the RV    and question of effusive pericardial fluid and question of    restrictive pysiology. It is also possible that all of these    questions are not real, but created by the difficult images.    TEE may help answer the questions. IMPRESSIONS -  The images are difficult. There is question of a mass in the RV    and question of effusive pericardial fluid and question of    restrictive pysiology. It is also possible that all of these    questions are not real, but created by the difficult images.    TEE may help answer the questions.  Recent Labs: 05/24/2017: ALT 9; BUN 20; Creatinine, Ser 1.23; Potassium 4.4; Sodium 137; TSH 11.510 05/30/2017: Hemoglobin 12.3; Platelets 420  No results found for requested labs within last 8760 hours.   CrCl cannot be calculated (Patient's most recent lab result is older than the maximum 21 days allowed.).   Wt Readings from Last 3 Encounters:  06/14/17 204 lb (92.5 kg)  05/30/17 201 lb 9.6 oz (91.4 kg)  04/30/17 205 lb (93 kg)     Other studies reviewed: Additional studies/records reviewed today include: summarized above ASSESSMENT AND PLAN:  1. PPM     Programmed VVIR     Intact function, battery is nearing ERI  2. Persistent, likely permanenet AFib     CHA2DS2Vasc is 4, on Eliquis     Low dose Eliquis.  Historically on xarelto 15mg  he had trouble with nose bleeds, and changed to 2.5mg  Eliquis.     Given very advanced age, CKD and Creat that has been as high as 1.6, generally looks  about 1.3, will not make changes     3. CAD     No anginal sounding symptoms, essentially non-ambulatory with severe gait instability  4. HTN     Looks OK, no changes  5. Near syncope?     Unclear, ER visit was suspect dehydration, pacer function is intact     Chronic feeling of unsteadiness even while seated, and essentially non-ambulatory     No syncope     Reportedly has been w/u at length historically without clear etiology  6. Pre-op      Given advanced age, CAD, and comorbid conditions, he is at an increased cardiac risk for procedure/surgery     The patient is reluctant to have the procedure and will discuss further with his urologist.  Disposition: monthly in-clinic device checks to monitor battery status, will plan for 3 months with dr. Caryl Comes, likely will be about ready for generator change  Current medicines are reviewed at length with the patient today.  The patient did not have any concerns regarding medicines.  Venetia Night, PA-C 06/14/2017 4:41 PM     [address] [place] [address] [phone] (office)  [phone] (fax)

## 2017-07-03 ENCOUNTER — Other Ambulatory Visit: Payer: Self-pay | Admitting: Family Medicine

## 2017-07-05 ENCOUNTER — Other Ambulatory Visit: Payer: Self-pay

## 2017-07-05 ENCOUNTER — Emergency Department (HOSPITAL_COMMUNITY): Payer: Medicare Other

## 2017-07-05 ENCOUNTER — Emergency Department (HOSPITAL_COMMUNITY)
Admission: EM | Admit: 2017-07-05 | Discharge: 2017-07-06 | Disposition: A | Discharge status: Home / Self Care | Payer: Medicare Other | Attending: Emergency Medicine | Admitting: Emergency Medicine

## 2017-07-05 ENCOUNTER — Encounter (HOSPITAL_COMMUNITY): Payer: Self-pay | Admitting: Emergency Medicine

## 2017-07-05 DIAGNOSIS — I442 Atrioventricular block, complete: Secondary | ICD-10-CM | POA: Insufficient documentation

## 2017-07-05 DIAGNOSIS — N184 Chronic kidney disease, stage 4 (severe): Secondary | ICD-10-CM | POA: Insufficient documentation

## 2017-07-05 DIAGNOSIS — R531 Weakness: Secondary | ICD-10-CM | POA: Insufficient documentation

## 2017-07-05 DIAGNOSIS — E039 Hypothyroidism, unspecified: Secondary | ICD-10-CM | POA: Insufficient documentation

## 2017-07-05 DIAGNOSIS — Z7901 Long term (current) use of anticoagulants: Secondary | ICD-10-CM | POA: Diagnosis not present

## 2017-07-05 DIAGNOSIS — I129 Hypertensive chronic kidney disease with stage 1 through stage 4 chronic kidney disease, or unspecified chronic kidney disease: Secondary | ICD-10-CM | POA: Insufficient documentation

## 2017-07-05 DIAGNOSIS — R404 Transient alteration of awareness: Secondary | ICD-10-CM | POA: Diagnosis not present

## 2017-07-05 DIAGNOSIS — Z87891 Personal history of nicotine dependence: Secondary | ICD-10-CM | POA: Insufficient documentation

## 2017-07-05 DIAGNOSIS — Z950 Presence of cardiac pacemaker: Secondary | ICD-10-CM | POA: Diagnosis not present

## 2017-07-05 DIAGNOSIS — E119 Type 2 diabetes mellitus without complications: Secondary | ICD-10-CM | POA: Insufficient documentation

## 2017-07-05 DIAGNOSIS — I259 Chronic ischemic heart disease, unspecified: Secondary | ICD-10-CM | POA: Diagnosis not present

## 2017-07-05 LAB — COMPREHENSIVE METABOLIC PANEL
ALK PHOS: 57 U/L (ref 38–126)
ALT: 15 U/L — AB (ref 17–63)
AST: 20 U/L (ref 15–41)
Albumin: 4 g/dL (ref 3.5–5.0)
Anion gap: 11 (ref 5–15)
BILIRUBIN TOTAL: 0.5 mg/dL (ref 0.3–1.2)
BUN: 24 mg/dL — ABNORMAL HIGH (ref 6–20)
CALCIUM: 9.2 mg/dL (ref 8.9–10.3)
CO2: 28 mmol/L (ref 22–32)
CREATININE: 1.27 mg/dL — AB (ref 0.61–1.24)
Chloride: 98 mmol/L — ABNORMAL LOW (ref 101–111)
GFR, EST AFRICAN AMERICAN: 54 mL/min — AB (ref >60)
GFR, EST NON AFRICAN AMERICAN: 47 mL/min — AB (ref >60)
Glucose, Bld: 106 mg/dL — ABNORMAL HIGH (ref 65–99)
Potassium: 4.1 mmol/L (ref 3.5–5.1)
Sodium: 137 mmol/L (ref 135–145)
Total Protein: 7.5 g/dL (ref 6.5–8.1)

## 2017-07-05 LAB — CBC WITH DIFFERENTIAL/PLATELET
BASOS ABS: 0 10*3/uL (ref 0.0–0.1)
Basophils Relative: 1 %
EOS PCT: 4 %
Eosinophils Absolute: 0.2 10*3/uL (ref 0.0–0.7)
HEMATOCRIT: 41 % (ref 39.0–52.0)
HEMOGLOBIN: 13 g/dL (ref 13.0–17.0)
LYMPHS ABS: 1.2 10*3/uL (ref 0.7–4.0)
LYMPHS PCT: 23 %
MCH: 28.8 pg (ref 26.0–34.0)
MCHC: 31.7 g/dL (ref 30.0–36.0)
MCV: 90.9 fL (ref 78.0–100.0)
Monocytes Absolute: 0.7 10*3/uL (ref 0.1–1.0)
Monocytes Relative: 13 %
NEUTROS ABS: 3.3 10*3/uL (ref 1.7–7.7)
NEUTROS PCT: 59 %
Platelets: 215 10*3/uL (ref 150–400)
RBC: 4.51 MIL/uL (ref 4.22–5.81)
RDW: 14.1 % (ref 11.5–15.5)
WBC: 5.5 10*3/uL (ref 4.0–10.5)

## 2017-07-05 LAB — ETHANOL: Alcohol, Ethyl (B): <10 mg/dL (ref <10)

## 2017-07-05 LAB — TROPONIN I

## 2017-07-05 MED ORDER — SODIUM CHLORIDE 0.9 % IV BOLUS (SEPSIS)
500.0000 mL | Freq: Once | INTRAVENOUS | Status: AC
  Administered 2017-07-05: 500 mL via INTRAVENOUS

## 2017-07-05 NOTE — ED Provider Notes (Signed)
[facility] EMERGENCY DEPARTMENT Provider Note   CSN: [id number] Arrival date & time: 07/05/17  2034     History   Chief Complaint Chief Complaint  Patient presents with  . sudden onset cannot walk    HPI [name] is a 84 y.o. male.  HPI  84 year old man history of right eye enucleation, coronary artery disease, complete heart block with pacemaker in place, diabetes, hypertension presents today stating that he has been having difficulty walking for the past 2 hours.  He states that he goes to bed at 5:00.  He says that when he got up at 7 to go the bathroom his legs felt very weak and he was unable to walk.  He states he had a similar episode to this back in November.  He is unable to have an MRI.  He denies any lateralized weakness.  He states that both his legs feel generally weak.  He denies any pain.  He denies any vision change and is basically unable to see due to her degeneration in his remaining eye.  Not had chest pain, fever, nausea, or vomiting.  He previously has presented with dehydration and apparently does not eat very well.  However there has not been any recent change.  He does complain of ongoing urinary incontinence. His son voiced concerns to me privately that his father takes Xanax every night and he is concerned that he may be drinking alcohol again.  He states that he used to drink a lot but stopped abruptly 34 years ago.  However, he states in the past year he has begun drinking some again. Past Medical History:  Diagnosis Date  . Blindness of right eye    Related to trauma in [place]  . CAD (coronary artery disease)    (non Q wave MI in Jan 2009. He had chronically ocluded right coronary artery. He had a LAD w/90% stenosis at the take off of the first diagona. He had a drug-eluding stent palced by Dr Burt Knack)  . Cancer [facility])    prostate ca with radiation in 2005  . Cellulitis of face feb 2011   prostetic rt eye, cellulitis of rt side of face  . Complete heart  block (HCC)    Zephyr dual chamber pacemaker per Dr Caryl Comes  . Diabetes mellitus without complication ([place])   . Dyslipidemia   . Hypertension   . Hypothyroid   . Macular degeneration     Patient Active Problem List   Diagnosis Date Noted  . OAB (overactive bladder) 05/08/2016  . Atherosclerotic PVD with ulceration ([place]) 11/23/2013  . Chronic ulcer of right great toe ([place]) 11/23/2013  . Depression 10/02/2013  . Anxiety state 10/02/2013  . Foot ulcer, right ([place]) 10/02/2013  . Heart burn 06/02/2013  . Cough 06/02/2013  . Chronic kidney disease (CKD), stage IV (severe) ([place]) 09/30/2012  . CAD (coronary artery disease) of artery bypass graft 09/30/2012  . Hypothyroidism 09/30/2012  . DM (diabetes mellitus) ([place]) 09/30/2012  . Atrial fibrillation-paroxysmal 09/12/2010  . Coronary artery disease- Status post drug-eluting stent 09/12/2010  . Hyperlipidemia 02/02/2010  . AV BLOCK, COMPLETE 02/02/2010  . ORTHOSTATIC DIZZINESS 02/02/2010  . DYSPNEA ON EXERTION 02/02/2010  . Pacemaker-St. Jude 02/02/2010    Past Surgical History:  Procedure Laterality Date  . eye removed Right 1997       Home Medications    Prior to Admission medications   Medication Sig Start Date End Date Taking? Authorizing Provider  ALPRAZolam Duanne Moron) 0.5 MG  tablet Take 1 tablet (0.5 mg total) by mouth at bedtime as needed. for sleep 05/24/17   [name], MD  apixaban (ELIQUIS) 2.5 MG TABS tablet Take 1 tablet (2.5 mg total) by mouth 2 (two) times daily. 07/24/16   [name], MD  Cholecalciferol (VITAMIN D PO) Take 1 tablet by mouth daily.    [provider]  glucose blood test strip Test BS BID and PRN. Dx e11.9 04/27/16   [name], MD  levothyroxine (SYNTHROID) 175 MCG tablet Take 1 tablet (175 mcg total) by mouth daily before breakfast. 05/28/17   [name], MD  levothyroxine (SYNTHROID, LEVOTHROID) 150 MCG tablet Take 1 tablet (150 mcg total) by mouth daily.  09/25/16   [name], MD  MELATONIN PO Take 1-2 tablets by mouth at bedtime.     [provider]  mirabegron ER (MYRBETRIQ) 25 MG TB24 tablet Take 25 mg by mouth daily.     [provider]  MYRBETRIQ 25 MG TB24 tablet TAKE 1 TABLET DAILY 07/03/17   [name], MD  nitroGLYCERIN (NITROSTAT) 0.4 MG SL tablet Place 1 tablet (0.4 mg total) under the tongue every 5 (five) minutes as needed for chest pain (Do not exceed 3 doses). 09/10/16   [name], MD  Omega-3 Fatty Acids (FISH OIL PO) Take 1-2 capsules by mouth daily.    [provider]    Family History Family History  Problem Relation Age of Onset  . Heart disease Father     Social History Social History   Tobacco Use  . Smoking status: Former Smoker    Packs/day: 2.00    Years: 65.00    Pack years: 130.00    Types: Cigarettes    Last attempt to quit: 09/11/2000    Years since quitting: 16.8  . Smokeless tobacco: Never Used  Substance Use Topics  . Alcohol use: Yes    Alcohol/week: 0.6 oz    Types: 1 Cans of beer per week    Comment: occ  . Drug use: No     Allergies   Patient has no known allergies.   Review of Systems Review of Systems  All other systems reviewed and are negative.    Physical Exam Updated Vital Signs BP (!) 147/68 (BP Location: Left Arm)   Pulse 70   Temp 98.4 F (36.9 C) (Rectal)   Resp 18   Ht 1.854 m (6\' 1" )   Wt 92.5 kg (204 lb)   SpO2 98%   BMI 26.91 kg/m   Physical Exam  Constitutional: He is oriented to person, place, and time. He appears well-developed and well-nourished.  HENT:  Head: Normocephalic and atraumatic.  Right Ear: External ear normal.  Left Ear: External ear normal.  Mouth/Throat: Oropharynx is clear and moist.  Eyes:  Right eye consistent with prosthesis Left eye extraocular movements intact  Neck: Normal range of motion. Neck supple.  Cardiovascular: Normal rate, regular rhythm, normal heart sounds and intact  distal pulses.  Pulmonary/Chest: Effort normal and breath sounds normal.  Abdominal: Soft. Bowel sounds are normal.  Musculoskeletal: Normal range of motion.  Neurological: He is alert and oriented to person, place, and time.  Bilateral lower extremities with some decreased strength against gravity and unable to hold off bed for >2 seconds Heel to shin with some decreased accuracy but appears equal bilaterally Nose to finger not performed due to decreased vision  Skin: Skin is warm and dry. Capillary refill takes  less than 2 seconds.  Psychiatric: He has a normal mood and affect. His behavior is normal.  Nursing note and vitals reviewed.    ED Treatments / Results  Labs (all labs ordered are listed, but only abnormal results are displayed) Labs Reviewed  COMPREHENSIVE METABOLIC PANEL - Abnormal; Notable for the following components:      Result Value   Chloride 98 (*)    Glucose, Bld 106 (*)    BUN 24 (*)    Creatinine, Ser 1.27 (*)    ALT 15 (*)    GFR calc non Af Amer 47 (*)    GFR calc Af Amer 54 (*)    All other components within normal limits  ETHANOL  CBC WITH DIFFERENTIAL/PLATELET  TROPONIN I  URINALYSIS, ROUTINE W REFLEX MICROSCOPIC  RAPID URINE DRUG SCREEN, HOSP PERFORMED    EKG  EKG Interpretation  Date/Time:  Friday July 05 2017 21:23:33 EST Ventricular Rate:  70 PR Interval:    QRS Duration: 141 QT Interval:  463 QTC Calculation: 500 R Axis:   -59 Text Interpretation:  Ventricular-paced rhythm No further analysis attempted due to paced rhythm Confirmed by Pattricia Boss [phone]) on 07/05/2017 10:37:16 PM       Radiology Dg Chest 1 View  Result Date: 07/05/2017 CLINICAL DATA:  Weakness EXAM: CHEST 1 VIEW COMPARISON:  05/24/2017 FINDINGS: Left-sided duo lead pacing device as before. Borderline cardiomegaly. Negative for pleural effusion or focal pulmonary airspace disease. Aortic atherosclerosis. No pneumothorax. IMPRESSION: No active disease.  Electronically Signed   By: Donavan Foil M.D.   On: 07/05/2017 22:06   Ct Head Wo Contrast  Result Date: 07/05/2017 CLINICAL DATA:  Weakness EXAM: CT HEAD WITHOUT CONTRAST TECHNIQUE: Contiguous axial images were obtained from the base of the skull through the vertex without intravenous contrast. COMPARISON:  04/30/2017, 11/29/2015 FINDINGS: Brain: No acute territorial infarction, hemorrhage or intracranial mass is visualized. Moderate small vessel ischemic changes of the white matter. Moderate atrophy. Stable ventricle size. Vascular: No hyperdense vessels. Carotid artery calcification. Vertebral artery calcification. Skull: No fracture. Sinuses/Orbits: Mild mucosal thickening in the maxillary sphenoid and ethmoid sinuses. No acute orbital abnormality. Right ocular prosthesis. Other: None IMPRESSION: 1. No CT evidence for acute intracranial abnormality. 2. Atrophy and small vessel ischemic changes of the white matter. Electronically Signed   By: Donavan Foil M.D.   On: 07/05/2017 21:58    Procedures Procedures (including critical care time)  Medications Ordered in ED Medications  sodium chloride 0.9 % bolus 500 mL (500 mLs Intravenous New Bag/Given 07/05/17 2157)     Initial Impression / Assessment and Plan / ED Course  I have reviewed the triage vital signs and the nursing notes.  Pertinent labs & imaging results that were available during my care of the patient were reviewed by me and considered in my medical decision making (see chart for details).     84 y.o man with generalized weakness and inability to ambulate on own with sudden onset this evening after sleep.  Patient has had similar episodes in past and does take xanax at bedtime.  Head ct without acute ic abnormalities Labs without acute abnormalities.   Urine pending Patient attempted to walk but continues with generalized weakness and unable to ambulate.  I do not see any focal signs or  back symptoms. Suspect generalized  weakness with ambulation as main symptom.  Discussed with patient and family.  He has had some chronicity to his weakness.  He has had a wheelchair  at home and has needed to use this intermittently.  He refuses any nursing home placement.  Family is prepared to take him home and arrange for some basis to be staying with him.  And for discharge Final Clinical Impressions(s) / ED Diagnoses   Final diagnoses:  Weakness    ED Discharge Orders    None       Pattricia Boss, MD 07/06/17 [phone]

## 2017-07-05 NOTE — ED Notes (Signed)
ATTEMPT TO WALK PT WITH WALKER UNSUCCESSFUL  PT ASSISTED TO SITTING POSITION ON STRETCHER- REPORTED DIZZY, WHENM THAT RESOLVED, PT ASSISTED TO STAND BY THIS RN AND TECH, ANGELINA  PT IS UNABLE TO TAKE MORE THAN ONE STEP WITH WALKER BEFORE  HE STATES HE CANNOT DO IT, HIS LEGS ARENT MOVING WELL AND THEY ARE TOO WEAK AND HE FEELS LIKE HE IS GOING TO FALL

## 2017-07-05 NOTE — ED Triage Notes (Signed)
Pt with TIA 3 years ago Has had decreased mobility for the last several years  Ambulates with a walker - until today and could not   Has a ventricular pacer  Moves all appendages

## 2017-07-06 NOTE — ED Notes (Addendum)
Pt wheeled to waiting room and placed into car by family. Pt verbalized understanding of discharge instructions.

## 2017-07-24 ENCOUNTER — Encounter: Payer: Self-pay | Admitting: Internal Medicine

## 2017-07-25 ENCOUNTER — Ambulatory Visit (INDEPENDENT_AMBULATORY_CARE_PROVIDER_SITE_OTHER): Payer: Medicare Other | Admitting: Internal Medicine

## 2017-07-25 ENCOUNTER — Encounter: Payer: Self-pay | Admitting: Internal Medicine

## 2017-07-25 VITALS — BP 144/60 | HR 60

## 2017-07-25 DIAGNOSIS — Z950 Presence of cardiac pacemaker: Secondary | ICD-10-CM | POA: Diagnosis not present

## 2017-07-25 DIAGNOSIS — I442 Atrioventricular block, complete: Secondary | ICD-10-CM | POA: Diagnosis not present

## 2017-07-25 DIAGNOSIS — I2510 Atherosclerotic heart disease of native coronary artery without angina pectoris: Secondary | ICD-10-CM

## 2017-07-25 DIAGNOSIS — I482 Chronic atrial fibrillation: Secondary | ICD-10-CM | POA: Diagnosis not present

## 2017-07-25 DIAGNOSIS — I4821 Permanent atrial fibrillation: Secondary | ICD-10-CM

## 2017-07-25 LAB — CUP PACEART INCLINIC DEVICE CHECK
Battery Impedance: 27100 Ohm
Battery Remaining Longevity: 3 mo
Brady Statistic RV Percent Paced: 99 %
Implantable Lead Implant Date: 20090127
Implantable Lead Location: 753860
Lead Channel Impedance Value: 418 Ohm
Lead Channel Sensing Intrinsic Amplitude: 8.4 mV
Lead Channel Setting Pacing Pulse Width: 0.4 ms
Lead Channel Setting Sensing Sensitivity: 4 mV
MDC IDC LEAD IMPLANT DT: 20090127
MDC IDC LEAD LOCATION: 753859
MDC IDC MSMT BATTERY VOLTAGE: 2.59 V
MDC IDC MSMT LEADCHNL RV PACING THRESHOLD AMPLITUDE: 0.75 V
MDC IDC MSMT LEADCHNL RV PACING THRESHOLD PULSEWIDTH: 0.4 ms
MDC IDC PG IMPLANT DT: 20090127
MDC IDC SESS DTM: 20190214170725
Pulse Gen Serial Number: 2010227

## 2017-07-25 NOTE — Progress Notes (Signed)
So Patient Care Team: Carlos Euler, MD as PCP - General (Family Medicine)   HPI  [name] is a 84 y.o. male seen in followup for a pacemaker implanted for CHB 2009.   He also has CAD with a chronically occluded right coronary artery. He had an LAD with 90% stenosis at the take off of the first diagonal. He had a drug-eluting stent placed by Dr. Burt Brown 2009     Myoview scan done 2013 was negative for ischemia;  LV function 60%   He is nonambulatory; no sob or chest pain. Some  edema   His device is approaching ERI.  His biggest issue is incontinence.  There is discussion about prostate surgery.  He saw [name] last month who noted that there would be increased cardiovascular risk.      Date Cr TSH Hgb  7/18 1.6  13.3(4/18)  1/19  1.27 11.51 13     Past Medical History:  Diagnosis Date  . Blindness of right eye    Related to trauma in [place]  . CAD (coronary artery disease)    (non Q wave MI in Jan 2009. He had chronically ocluded right coronary artery. He had a LAD w/90% stenosis at the take off of the first diagona. He had a drug-eluding stent palced by Dr [name])  . Cancer [facility])    prostate ca with radiation in 2005  . Cellulitis of face feb 2011   prostetic rt eye, cellulitis of rt side of face  . Complete heart block (HCC)    Zephyr dual chamber pacemaker per Dr [name]  . Diabetes mellitus without complication ([place])   . Dyslipidemia   . Hypertension   . Hypothyroid   . Macular degeneration     Past Surgical History:  Procedure Laterality Date  . eye removed Right 1997    Current Outpatient Medications  Medication Sig Dispense Refill  . ALPRAZolam (XANAX) 0.5 MG tablet Take 1 tablet (0.5 mg total) by mouth at bedtime as needed. for sleep (Patient taking differently: Take 0.5 mg by mouth at bedtime. for sleep) 90 tablet 0  . apixaban (ELIQUIS) 2.5 MG TABS tablet Take 1 tablet (2.5 mg total) by mouth 2 (two) times daily. 180 tablet 5  . Cholecalciferol  (VITAMIN D PO) Take 1 tablet by mouth daily.    Marland Kitchen glucose blood test strip Test BS BID and PRN. Dx e11.9 300 each 3  . ketoconazole (NIZORAL) 2 % shampoo Apply 1 application topically 3 (three) times a week.    . levothyroxine (SYNTHROID) 175 MCG tablet Take 1 tablet (175 mcg total) by mouth daily before breakfast. 90 tablet 1  . mirabegron ER (MYRBETRIQ) 25 MG TB24 tablet Take 25 mg by mouth daily.    . Multiple Vitamins-Minerals (ZINC PO) Take 1 tablet by mouth every other day.    . nitroGLYCERIN (NITROSTAT) 0.4 MG SL tablet Place 1 tablet (0.4 mg total) under the tongue every 5 (five) minutes as needed for chest pain (Do not exceed 3 doses). 25 tablet 3  . POTASSIUM PO Take 1 tablet by mouth every other day.     No current facility-administered medications for this visit.     No Known Allergies  Review of Systems negative except from HPI and PMH  Physical Exam BP (!) 144/60   Pulse 60  Well developed and nourished in no acute distress HENT blind R eye  No Clubbing cyanosis edema Skin-warm and dry A & Oriented  Grossly normal  sensory and motor function  ECG demonstrates atrial fib V pacing  Assessment and  Plan  Congestive heart failure-chronic diastolic  Atrial fibrillation-persistent  Thrombolic risk factors hypertension-1 age-43 vascular disease-1  Pacemaker-St. Jude   The patient's device was interrogated.  The information was reviewed. No changes were made in the programming.     Hypothyoidism  Treated    On Anticoagulation;  No bleeding issues   Afib permanent  The issue was discussed as to what to do about his bladder problem.  It is hard to estimate his longevity, but he is miserable being wet every night.  I have encouraged him to reconsider his decision.  His TSH was elevated 05/24/17.  Called his daughter who is not sure as to whether thyroid was up titrated from that time.  She will look into it.  His device is approaching ERI.  We will see him in 2  months.  We spent more than 50% of our >25 min visit in face to face counseling regarding the above

## 2017-07-25 NOTE — Patient Instructions (Addendum)
Medication Instructions:  Your physician recommends that you continue on your current medications as directed. Please refer to the Current Medication list given to you today.  Labwork: None ordered.  Testing/Procedures: None ordered.  Follow-Up: Your physician recommends that you schedule a follow-up appointment in April with Dr Caryl Comes    Any Other Special Instructions Will Be Listed Below (If Applicable).     If you need a refill on your cardiac medications before your next appointment, please call your pharmacy.

## 2017-07-31 ENCOUNTER — Telehealth: Payer: Self-pay | Admitting: Family Medicine

## 2017-07-31 DIAGNOSIS — F411 Generalized anxiety disorder: Secondary | ICD-10-CM

## 2017-07-31 MED ORDER — ALPRAZOLAM 0.5 MG PO TABS: 0.5000 mg | ORAL_TABLET | Freq: Every day | ORAL | 0 refills | Status: DC

## 2017-07-31 NOTE — Telephone Encounter (Signed)
Left detailed message rx sent  °

## 2017-07-31 NOTE — Telephone Encounter (Signed)
Xanax re-ordered.   Laroy Apple, MD [facility] 07/31/2017, 3:30 PM

## 2017-08-17 ENCOUNTER — Other Ambulatory Visit: Payer: Self-pay | Admitting: Family Medicine

## 2017-08-22 DIAGNOSIS — J342 Deviated nasal septum: Secondary | ICD-10-CM | POA: Diagnosis not present

## 2017-08-22 DIAGNOSIS — H903 Sensorineural hearing loss, bilateral: Secondary | ICD-10-CM | POA: Diagnosis not present

## 2017-08-22 DIAGNOSIS — H6121 Impacted cerumen, right ear: Secondary | ICD-10-CM | POA: Diagnosis not present

## 2017-08-22 DIAGNOSIS — H6061 Unspecified chronic otitis externa, right ear: Secondary | ICD-10-CM | POA: Diagnosis not present

## 2017-08-27 ENCOUNTER — Telehealth: Payer: Self-pay | Admitting: Family Medicine

## 2017-08-27 NOTE — Telephone Encounter (Signed)
Patient aware that pharmacy has been changed to [facility], refills not due at this time.

## 2017-08-29 DIAGNOSIS — H903 Sensorineural hearing loss, bilateral: Secondary | ICD-10-CM | POA: Diagnosis not present

## 2017-08-29 DIAGNOSIS — H6061 Unspecified chronic otitis externa, right ear: Secondary | ICD-10-CM | POA: Diagnosis not present

## 2017-08-29 DIAGNOSIS — H6121 Impacted cerumen, right ear: Secondary | ICD-10-CM | POA: Diagnosis not present

## 2017-09-03 ENCOUNTER — Telehealth: Payer: Self-pay | Admitting: Internal Medicine

## 2017-09-03 NOTE — Telephone Encounter (Signed)
NEW MESSAGE   Patient daughter requesting call from nurse to discuss care. Has questions about replacing pacemaker

## 2017-09-04 NOTE — Telephone Encounter (Signed)
Spoke with pt's daughter, [name]. She was concerned with a nurse's aid stating her dad "was loosing memory every day and he wasn't getting enough oxygen to his brain." She wanted to make sure it didn't have anything to do with his pacemaker. I told her that as of Feb, he still had 0.25 of a year (estimated) on his battery life and he has an appointment to be checked in clinic again in April. She stated she felt better about it and thanked me for the call.

## 2017-09-06 ENCOUNTER — Encounter: Payer: Self-pay | Admitting: Internal Medicine

## 2017-09-06 DIAGNOSIS — I442 Atrioventricular block, complete: Secondary | ICD-10-CM | POA: Diagnosis not present

## 2017-09-11 ENCOUNTER — Other Ambulatory Visit: Payer: Self-pay | Admitting: Family Medicine

## 2017-09-11 NOTE — Telephone Encounter (Signed)
Covering PCP- please advise  

## 2017-09-11 NOTE — Telephone Encounter (Signed)
I am not sure what a heavy potty seat is. Most people get a BEDSIDE COMMODE?

## 2017-09-11 NOTE — Telephone Encounter (Signed)
States they would like a heavy bedside commode.  States they have a old one from his wife and when he tries to use it it tips over.

## 2017-09-11 NOTE — Telephone Encounter (Signed)
Script for commode chair ready for patient . Copy to be scanned in to chart.

## 2017-09-11 NOTE — Telephone Encounter (Signed)
I hand wrote a script, still not sure what they will get.

## 2017-09-13 ENCOUNTER — Ambulatory Visit: Payer: Medicare Other | Admitting: Family Medicine

## 2017-09-16 ENCOUNTER — Ambulatory Visit (INDEPENDENT_AMBULATORY_CARE_PROVIDER_SITE_OTHER): Payer: Medicare Other | Admitting: Family Medicine

## 2017-09-16 ENCOUNTER — Encounter: Payer: Self-pay | Admitting: Family Medicine

## 2017-09-16 VITALS — BP 121/60 | HR 57 | Temp 96.6°F

## 2017-09-16 DIAGNOSIS — R262 Difficulty in walking, not elsewhere classified: Secondary | ICD-10-CM | POA: Diagnosis not present

## 2017-09-16 DIAGNOSIS — H9193 Unspecified hearing loss, bilateral: Secondary | ICD-10-CM | POA: Diagnosis not present

## 2017-09-16 DIAGNOSIS — I2510 Atherosclerotic heart disease of native coronary artery without angina pectoris: Secondary | ICD-10-CM | POA: Diagnosis not present

## 2017-09-16 NOTE — Progress Notes (Signed)
   HPI  Patient presents today here with difficulty hearing.  Patient states that he had his ears cleaned out by ENT about 2 weeks ago, his son states that he did well for a couple of days after that.  Since that time he has had difficulty hearing bilaterally.  Patient states that Cortisporin drops were given and these make the hearing problem worse.  Patient son also states that for 1-2 weeks he has had more difficulty walking and slurred speech.  After more discussion it sounds like the symptoms are waxing and waning, that he has some good days and some bad days, his good days seem to be less and less lately. He has been told by a pacemaker technician that he could possibly have an MRI at [facility], he would be interested in seeing a neurologist at [facility] for further evaluation for difficulty walking. Patient had slowly worsening difficulty walking over the last few years, he has been evaluated by local neurologist.  PMH: Smoking status noted ROS: Per HPI  Objective: BP 121/60   Pulse (!) 57   Temp (!) 96.6 F (35.9 C) (Oral)  Gen: NAD, alert, cooperative with exam HEENT: NCAT, right eyelid droop- chronic CV: RRR, good S1/S2, no murmur Resp: CTABL, no wheezes, non-labored Ext: No edema, warm Neuro: Alert and oriented, No gross deficits  Assessment and plan:  #Difficulty hearing Bilateral difficulty hearing, TMs normal in appearance today, he has had recent cleanout by ENT Patient states that it gets worse after placing Cortisporin drops, he is only used these 2 or 3 times. No changes, I think it is reasonable to follow up with ENT  #Difficulty walking Patient with progressive difficulty walking, also 1-2 weeks of slurred speech Possible TIA versus stroke, his son states that he has had multiple CTs and is not eager to have another. He feels that he needs an MRI. Patient is on Eliquis for A. fib, no antiplatelet. Refe rto [facility] per their request  Offered  CT head, with 7-10 days of symps and already on blood thinner with too high risk to add asa to it I dont belivee it would change therapy much at this point.    Orders Placed This Encounter  Procedures  . Ambulatory referral to Neurology    Referral Priority:   Routine    Referral Type:   Consultation    Referral Reason:   Specialty Services Required    Requested Specialty:   Neurology    Number of Visits Requested:   [place], MD [facility] 09/16/2017, 1:37 PM

## 2017-09-23 ENCOUNTER — Encounter: Payer: Self-pay | Admitting: Internal Medicine

## 2017-09-23 ENCOUNTER — Ambulatory Visit (INDEPENDENT_AMBULATORY_CARE_PROVIDER_SITE_OTHER): Payer: Medicare Other | Admitting: Internal Medicine

## 2017-09-23 VITALS — BP 118/64 | HR 63 | Ht 73.0 in | Wt 205.0 lb

## 2017-09-23 DIAGNOSIS — I4821 Permanent atrial fibrillation: Secondary | ICD-10-CM

## 2017-09-23 DIAGNOSIS — I442 Atrioventricular block, complete: Secondary | ICD-10-CM

## 2017-09-23 DIAGNOSIS — I2510 Atherosclerotic heart disease of native coronary artery without angina pectoris: Secondary | ICD-10-CM

## 2017-09-23 DIAGNOSIS — I482 Chronic atrial fibrillation: Secondary | ICD-10-CM

## 2017-09-23 DIAGNOSIS — Z950 Presence of cardiac pacemaker: Secondary | ICD-10-CM

## 2017-09-23 LAB — CUP PACEART INCLINIC DEVICE CHECK
Battery Impedance: 31600 Ohm
Battery Remaining Longevity: 0 mo
Battery Voltage: 2.61 V
Date Time Interrogation Session: 20190415153812
Implantable Lead Implant Date: 20090127
Implantable Lead Location: 753860
Lead Channel Setting Pacing Amplitude: 2 V
Lead Channel Setting Sensing Sensitivity: 4 mV
MDC IDC LEAD IMPLANT DT: 20090127
MDC IDC LEAD LOCATION: 753859
MDC IDC MSMT LEADCHNL RV IMPEDANCE VALUE: 464 Ohm
MDC IDC MSMT LEADCHNL RV PACING THRESHOLD AMPLITUDE: 1 V
MDC IDC MSMT LEADCHNL RV PACING THRESHOLD PULSEWIDTH: 0.4 ms
MDC IDC PG IMPLANT DT: 20090127
MDC IDC SET LEADCHNL RV PACING PULSEWIDTH: 0.4 ms
Pulse Gen Serial Number: 2010227

## 2017-09-23 NOTE — Patient Instructions (Addendum)
Medication Instructions:  Your physician recommends that you continue on your current medications as directed. Please refer to the Current Medication list given to you today.  Labwork: Your physician recommends that you return for lab work in:   You will have a CBC and BMP drawn today  Testing/Procedures: Your physician has recommended that you have a pacemaker battery replacement.  Follow-Up: Your physician recommends that you schedule a follow-up appointment in:   10-14 days after your procedure for a wound check 91 days after your procedure with Dr Caryl Comes  Any Other Special Instructions Will Be Listed Below (If Applicable).     If you need a refill on your cardiac medications before your next appointment, please call your pharmacy.

## 2017-09-23 NOTE — Progress Notes (Signed)
So Patient Care Team: [name], MD as PCP - General (Family Medicine)   HPI  [name] is a 84 y.o. male seen in followup for a pacemaker implanted for CHB 2009.   He also has CAD with a chronically occluded right coronary artery. He had an LAD with 90% stenosis at the take off of the first diagonal. He had a drug-eluting stent placed by Dr. Burt Knack 2009   Myoview scan done 2013 was negative for ischemia;  LV function 60%   He is nonambulatory; no sob or chest pain. Some  edema   His device has been approaching ERI    His issues have been incontinence with concern about prostate surgery as well as difficulties walking for which he has seen neurology  He is hoping to have an MRI to assess ambulation   No shortness of breath or chest pain  Does not walk much  No edema   Date Cr TSH Hgb  7/18 1.6  13.3(4/18)  1/19  1.27 73.22 13    Thrombolic risk factors hypertension-1 age-34 DM-1  vascular disease-1 CHADSVasc score 5  Past Medical History:  Diagnosis Date  . Blindness of right eye    Related to trauma in [place]  . CAD (coronary artery disease)    (non Q wave MI in Jan 2009. He had chronically ocluded right coronary artery. He had a LAD w/90% stenosis at the take off of the first diagona. He had a drug-eluding stent palced by Dr Burt Knack)  . Cancer [facility])    prostate ca with radiation in 2005  . Cellulitis of face feb 2011   prostetic rt eye, cellulitis of rt side of face  . Complete heart block (HCC)    Zephyr dual chamber pacemaker per Dr Caryl Comes  . Diabetes mellitus without complication ([place])   . Dyslipidemia   . Hypertension   . Hypothyroid   . Macular degeneration     Past Surgical History:  Procedure Laterality Date  . eye removed Right 1997    Current Outpatient Medications  Medication Sig Dispense Refill  . ALPRAZolam (XANAX) 0.5 MG tablet Take 1 tablet (0.5 mg total) by mouth at bedtime. for sleep 90 tablet 0  . apixaban (ELIQUIS) 2.5 MG TABS  tablet Take 1 tablet (2.5 mg total) by mouth 2 (two) times daily. 180 tablet 5  . Cholecalciferol (VITAMIN D PO) Take 1 tablet by mouth daily.    Marland Kitchen glucose blood test strip Test BS BID and PRN. Dx e11.9 300 each 3  . ketoconazole (NIZORAL) 2 % shampoo Apply 1 application topically 3 (three) times a week.    . levothyroxine (SYNTHROID) 175 MCG tablet Take 1 tablet (175 mcg total) by mouth daily before breakfast. 90 tablet 1  . mirabegron ER (MYRBETRIQ) 25 MG TB24 tablet Take 25 mg by mouth daily.    . Multiple Vitamins-Minerals (ZINC PO) Take 1 tablet by mouth every other day.    . nitroGLYCERIN (NITROSTAT) 0.4 MG SL tablet Place 1 tablet (0.4 mg total) under the tongue every 5 (five) minutes as needed for chest pain (Do not exceed 3 doses). 25 tablet 3  . POTASSIUM PO Take 1 tablet by mouth every other day.    Marland Kitchen SYNTHROID 150 MCG tablet TAKE 1 TABLET DAILY 90 tablet 2   No current facility-administered medications for this visit.     No Known Allergies  Review of Systems negative except from HPI and PMH  Physical Exam BP 118/64  Pulse 63   Ht 6\' 1"  (1.854 m)   Wt 205 lb (93 kg) Comment: per patient. patient is unable to ambulate  SpO2 97%   BMI 27.05 kg/m  Well developed and nourished in no acute distress HENT blind R eye Device pocket well healed; without hematoma or erythema.  There is no tethering  Clear Regular rate and rhythm, no murmurs or gallops Abd-soft with active BS No Clubbing cyanosis edema Skin-warm and dry A & Oriented  Grossly normal sensory and motor function   Assessment and  Plan  Congestive heart failure-chronic diastolic  Atrial fibrillation-persistent  Complete heart block  Pacemaker-St. Jude   The patient's device was interrogated.  The information was reviewed. No changes were made in the programming.     Hypothyoidism  Treated    On Anticoagulation;  No bleeding issues   Afib permanent   His device has reached ERI   We will replace it  He  will then plan to get his MRI    We have reviewed the benefits and risks of generator replacement.  These include but are not limited to lead fracture and infection.  The patient understands, agrees and is willing to proceed.     We spent more than 50% of our >25 min visit in face to face counseling regarding the above

## 2017-09-23 NOTE — H&P (View-Only) (Signed)
So Patient Care Team: [name], MD as PCP - General (Family Medicine)   HPI  [name] is a 84 y.o. male seen in followup for a pacemaker implanted for CHB 2009.   He also has CAD with a chronically occluded right coronary artery. He had an LAD with 90% stenosis at the take off of the first diagonal. He had a drug-eluting stent placed by Dr. Burt Knack 2009   Myoview scan done 2013 was negative for ischemia;  LV function 60%   He is nonambulatory; no sob or chest pain. Some  edema   His device has been approaching ERI    His issues have been incontinence with concern about prostate surgery as well as difficulties walking for which he has seen neurology  He is hoping to have an MRI to assess ambulation   No shortness of breath or chest pain  Does not walk much  No edema   Date Cr TSH Hgb  7/18 1.6  13.3(4/18)  1/19  1.27 17.61 13    Thrombolic risk factors hypertension-1 age-15 DM-1  vascular disease-1 CHADSVasc score 5  Past Medical History:  Diagnosis Date  . Blindness of right eye    Related to trauma in [place]  . CAD (coronary artery disease)    (non Q wave MI in Jan 2009. He had chronically ocluded right coronary artery. He had a LAD w/90% stenosis at the take off of the first diagona. He had a drug-eluding stent palced by Dr Burt Knack)  . Cancer [facility])    prostate ca with radiation in 2005  . Cellulitis of face feb 2011   prostetic rt eye, cellulitis of rt side of face  . Complete heart block (HCC)    Zephyr dual chamber pacemaker per Dr Caryl Comes  . Diabetes mellitus without complication ([place])   . Dyslipidemia   . Hypertension   . Hypothyroid   . Macular degeneration     Past Surgical History:  Procedure Laterality Date  . eye removed Right 1997    Current Outpatient Medications  Medication Sig Dispense Refill  . ALPRAZolam (XANAX) 0.5 MG tablet Take 1 tablet (0.5 mg total) by mouth at bedtime. for sleep 90 tablet 0  . apixaban (ELIQUIS) 2.5 MG TABS  tablet Take 1 tablet (2.5 mg total) by mouth 2 (two) times daily. 180 tablet 5  . Cholecalciferol (VITAMIN D PO) Take 1 tablet by mouth daily.    Marland Kitchen glucose blood test strip Test BS BID and PRN. Dx e11.9 300 each 3  . ketoconazole (NIZORAL) 2 % shampoo Apply 1 application topically 3 (three) times a week.    . levothyroxine (SYNTHROID) 175 MCG tablet Take 1 tablet (175 mcg total) by mouth daily before breakfast. 90 tablet 1  . mirabegron ER (MYRBETRIQ) 25 MG TB24 tablet Take 25 mg by mouth daily.    . Multiple Vitamins-Minerals (ZINC PO) Take 1 tablet by mouth every other day.    . nitroGLYCERIN (NITROSTAT) 0.4 MG SL tablet Place 1 tablet (0.4 mg total) under the tongue every 5 (five) minutes as needed for chest pain (Do not exceed 3 doses). 25 tablet 3  . POTASSIUM PO Take 1 tablet by mouth every other day.    Marland Kitchen SYNTHROID 150 MCG tablet TAKE 1 TABLET DAILY 90 tablet 2   No current facility-administered medications for this visit.     No Known Allergies  Review of Systems negative except from HPI and PMH  Physical Exam BP 118/64  Pulse 63   Ht 6\' 1"  (1.854 m)   Wt 205 lb (93 kg) Comment: per patient. patient is unable to ambulate  SpO2 97%   BMI 27.05 kg/m  Well developed and nourished in no acute distress HENT blind R eye Device pocket well healed; without hematoma or erythema.  There is no tethering  Clear Regular rate and rhythm, no murmurs or gallops Abd-soft with active BS No Clubbing cyanosis edema Skin-warm and dry A & Oriented  Grossly normal sensory and motor function   Assessment and  Plan  Congestive heart failure-chronic diastolic  Atrial fibrillation-persistent  Complete heart block  Pacemaker-St. Jude   The patient's device was interrogated.  The information was reviewed. No changes were made in the programming.     Hypothyoidism  Treated    On Anticoagulation;  No bleeding issues   Afib permanent   His device has reached ERI   We will replace it  He  will then plan to get his MRI    We have reviewed the benefits and risks of generator replacement.  These include but are not limited to lead fracture and infection.  The patient understands, agrees and is willing to proceed.     We spent more than 50% of our >25 min visit in face to face counseling regarding the above

## 2017-09-24 LAB — CBC WITH DIFFERENTIAL/PLATELET
BASOS ABS: 0 10*3/uL (ref 0.0–0.2)
Basos: 1 %
EOS (ABSOLUTE): 0.2 10*3/uL (ref 0.0–0.4)
Eos: 3 %
Hematocrit: 37.3 % — ABNORMAL LOW (ref 37.5–51.0)
Hemoglobin: 12.2 g/dL — ABNORMAL LOW (ref 13.0–17.7)
IMMATURE GRANS (ABS): 0 10*3/uL (ref 0.0–0.1)
IMMATURE GRANULOCYTES: 1 %
LYMPHS: 25 %
Lymphocytes Absolute: 1.5 10*3/uL (ref 0.7–3.1)
MCH: 28.4 pg (ref 26.6–33.0)
MCHC: 32.7 g/dL (ref 31.5–35.7)
MCV: 87 fL (ref 79–97)
MONOS ABS: 0.6 10*3/uL (ref 0.1–0.9)
Monocytes: 10 %
NEUTROS ABS: 3.8 10*3/uL (ref 1.4–7.0)
NEUTROS PCT: 60 %
Platelets: 271 10*3/uL (ref 150–379)
RBC: 4.29 x10E6/uL (ref 4.14–5.80)
RDW: 14.6 % (ref 12.3–15.4)
WBC: 6.2 10*3/uL (ref 3.4–10.8)

## 2017-09-24 LAB — BASIC METABOLIC PANEL
BUN/Creatinine Ratio: 17 (ref 10–24)
BUN: 28 mg/dL (ref 10–36)
CALCIUM: 9.4 mg/dL (ref 8.6–10.2)
CO2: 23 mmol/L (ref 20–29)
Chloride: 100 mmol/L (ref 96–106)
Creatinine, Ser: 1.63 mg/dL — ABNORMAL HIGH (ref 0.76–1.27)
GFR calc Af Amer: 41 mL/min/{1.73_m2} — ABNORMAL LOW (ref >59)
GFR calc non Af Amer: 36 mL/min/{1.73_m2} — ABNORMAL LOW (ref >59)
Glucose: 102 mg/dL — ABNORMAL HIGH (ref 65–99)
POTASSIUM: 4.8 mmol/L (ref 3.5–5.2)
SODIUM: 140 mmol/L (ref 134–144)

## 2017-10-03 ENCOUNTER — Telehealth: Payer: Self-pay | Admitting: Internal Medicine

## 2017-10-03 NOTE — Telephone Encounter (Signed)
New Message:\       Pt's son is calling due to pt having a procedure on tomorrow and pt was told not to take his blood thinner and pt  Has taken it.  Pt's son would like to know can he still have the surgery.

## 2017-10-03 NOTE — Telephone Encounter (Signed)
Per Dr Caryl Comes, [place] to hold tonight and tomorrow

## 2017-10-04 ENCOUNTER — Ambulatory Visit (HOSPITAL_COMMUNITY)
Admission: RE | Admit: 2017-10-04 | Discharge: 2017-10-04 | Disposition: A | Discharge status: Home / Self Care | Payer: Medicare Other | Source: Ambulatory Visit | Attending: Internal Medicine | Admitting: Internal Medicine

## 2017-10-04 ENCOUNTER — Ambulatory Visit (HOSPITAL_COMMUNITY)
Admission: RE | Disposition: A | Discharge status: Home / Self Care | Payer: Self-pay | Source: Ambulatory Visit | Attending: Internal Medicine

## 2017-10-04 ENCOUNTER — Encounter (HOSPITAL_COMMUNITY): Payer: Self-pay | Admitting: Internal Medicine

## 2017-10-04 DIAGNOSIS — H5461 Unqualified visual loss, right eye, normal vision left eye: Secondary | ICD-10-CM | POA: Insufficient documentation

## 2017-10-04 DIAGNOSIS — I110 Hypertensive heart disease with heart failure: Secondary | ICD-10-CM | POA: Insufficient documentation

## 2017-10-04 DIAGNOSIS — Z8546 Personal history of malignant neoplasm of prostate: Secondary | ICD-10-CM | POA: Insufficient documentation

## 2017-10-04 DIAGNOSIS — H3530 Unspecified macular degeneration: Secondary | ICD-10-CM | POA: Diagnosis not present

## 2017-10-04 DIAGNOSIS — Z923 Personal history of irradiation: Secondary | ICD-10-CM | POA: Diagnosis not present

## 2017-10-04 DIAGNOSIS — I2510 Atherosclerotic heart disease of native coronary artery without angina pectoris: Secondary | ICD-10-CM | POA: Diagnosis not present

## 2017-10-04 DIAGNOSIS — I442 Atrioventricular block, complete: Secondary | ICD-10-CM | POA: Insufficient documentation

## 2017-10-04 DIAGNOSIS — Z7901 Long term (current) use of anticoagulants: Secondary | ICD-10-CM | POA: Diagnosis not present

## 2017-10-04 DIAGNOSIS — I482 Chronic atrial fibrillation: Secondary | ICD-10-CM | POA: Diagnosis not present

## 2017-10-04 DIAGNOSIS — Z955 Presence of coronary angioplasty implant and graft: Secondary | ICD-10-CM | POA: Insufficient documentation

## 2017-10-04 DIAGNOSIS — I5032 Chronic diastolic (congestive) heart failure: Secondary | ICD-10-CM | POA: Insufficient documentation

## 2017-10-04 DIAGNOSIS — E039 Hypothyroidism, unspecified: Secondary | ICD-10-CM | POA: Insufficient documentation

## 2017-10-04 DIAGNOSIS — E119 Type 2 diabetes mellitus without complications: Secondary | ICD-10-CM | POA: Diagnosis not present

## 2017-10-04 DIAGNOSIS — I2582 Chronic total occlusion of coronary artery: Secondary | ICD-10-CM | POA: Diagnosis not present

## 2017-10-04 DIAGNOSIS — E785 Hyperlipidemia, unspecified: Secondary | ICD-10-CM | POA: Insufficient documentation

## 2017-10-04 DIAGNOSIS — I252 Old myocardial infarction: Secondary | ICD-10-CM | POA: Diagnosis not present

## 2017-10-04 DIAGNOSIS — Z950 Presence of cardiac pacemaker: Secondary | ICD-10-CM

## 2017-10-04 DIAGNOSIS — R32 Unspecified urinary incontinence: Secondary | ICD-10-CM | POA: Insufficient documentation

## 2017-10-04 DIAGNOSIS — Z45010 Encounter for checking and testing of cardiac pacemaker pulse generator [battery]: Secondary | ICD-10-CM | POA: Insufficient documentation

## 2017-10-04 HISTORY — PX: PPM GENERATOR CHANGEOUT: EP1233

## 2017-10-04 LAB — GLUCOSE, CAPILLARY: Glucose-Capillary: 109 mg/dL — ABNORMAL HIGH (ref 65–99)

## 2017-10-04 LAB — SURGICAL PCR SCREEN
MRSA, PCR: NEGATIVE
STAPHYLOCOCCUS AUREUS: POSITIVE — AB

## 2017-10-04 SURGERY — PPM GENERATOR CHANGEOUT

## 2017-10-04 MED ORDER — MUPIROCIN 2 % EX OINT
TOPICAL_OINTMENT | CUTANEOUS | Status: AC
  Administered 2017-10-04: 1 via TOPICAL
  Filled 2017-10-04: qty 22

## 2017-10-04 MED ORDER — SODIUM CHLORIDE 0.9 % IV SOLN
80.0000 mg | INTRAVENOUS | Status: AC
  Administered 2017-10-04: 80 mg

## 2017-10-04 MED ORDER — SODIUM CHLORIDE 0.9 % IV SOLN
INTRAVENOUS | Status: AC
  Filled 2017-10-04: qty 2

## 2017-10-04 MED ORDER — CEFAZOLIN SODIUM-DEXTROSE 2-4 GM/100ML-% IV SOLN
INTRAVENOUS | Status: AC
  Filled 2017-10-04: qty 100

## 2017-10-04 MED ORDER — SODIUM CHLORIDE 0.9 % IV SOLN: INTRAVENOUS | Status: AC

## 2017-10-04 MED ORDER — MUPIROCIN 2 % EX OINT
1.0000 "application " | TOPICAL_OINTMENT | Freq: Once | CUTANEOUS | Status: AC
  Administered 2017-10-04: 1 via TOPICAL

## 2017-10-04 MED ORDER — CEFAZOLIN SODIUM-DEXTROSE 2-4 GM/100ML-% IV SOLN
2.0000 g | INTRAVENOUS | Status: AC
  Administered 2017-10-04: 2 g via INTRAVENOUS

## 2017-10-04 MED ORDER — CHLORHEXIDINE GLUCONATE 4 % EX LIQD: 60.0000 mL | Freq: Once | CUTANEOUS | Status: DC

## 2017-10-04 MED ORDER — ACETAMINOPHEN 325 MG PO TABS
325.0000 mg | ORAL_TABLET | ORAL | Status: DC | PRN
  Filled 2017-10-04: qty 2

## 2017-10-04 MED ORDER — HEPARIN (PORCINE) IN NACL 2-0.9 UNITS/ML
INTRAMUSCULAR | Status: AC | PRN
  Administered 2017-10-04: 500 mL

## 2017-10-04 MED ORDER — ONDANSETRON HCL 4 MG/2ML IJ SOLN: 4.0000 mg | Freq: Four times a day (QID) | INTRAMUSCULAR | Status: DC | PRN

## 2017-10-04 MED ORDER — LIDOCAINE HCL (PF) 1 % IJ SOLN
INTRAMUSCULAR | Status: DC | PRN
  Administered 2017-10-04: 45 mL

## 2017-10-04 MED ORDER — LIDOCAINE HCL (PF) 1 % IJ SOLN
INTRAMUSCULAR | Status: AC
  Filled 2017-10-04: qty 30

## 2017-10-04 MED ORDER — SODIUM CHLORIDE 0.9 % IV SOLN
INTRAVENOUS | Status: DC
  Administered 2017-10-04: 07:00:00 via INTRAVENOUS

## 2017-10-04 SURGICAL SUPPLY — 5 items
CABLE SURGICAL S-101-97-12 (CABLE) ×2 IMPLANT
HEMOSTAT SURGICEL 2X4 FIBR (HEMOSTASIS) ×2 IMPLANT
PACEMAKER ASSURITY DR-RF (Pacemaker) ×2 IMPLANT
PAD DEFIB LIFELINK (PAD) ×2 IMPLANT
TRAY PACEMAKER INSERTION (CUSTOM PROCEDURE TRAY) ×2 IMPLANT

## 2017-10-04 NOTE — Progress Notes (Signed)
Discharge instruction given per MD order.  Pt and family stated they understood instruction.

## 2017-10-04 NOTE — Discharge Instructions (Signed)
Mupirocin nasal ointment What is this medicine? MUPIROCIN CALCIUM (myoo PEER oh sin KAL see um) is an antibiotic. It is used inside the nose to treat infections that are caused by certain bacteria. This helps prevent the spread of infection to patients and health care workers during outbreaks at institutions. This medicine may be used for other purposes; ask your health care provider or pharmacist if you have questions. COMMON BRAND NAME(S): Bactroban What should I tell my health care provider before I take this medicine? They need to know if you have any of these conditions: -an unusual or allergic reaction to mupirocin, other medicines, foods, dyes, or preservatives -pregnant or trying to get pregnant -breast-feeding How should I use this medicine? This medicine is only for use inside the nose. Follow the directions on the prescription label. Wash your hands before and after use. Squeeze half the contents of a single-use tube into one nostril, then squeeze the other half into the other nostril. Press the sides of your nose together and gently massage after application to spread the ointment throughout the nostrils. Do not use your medicine more often than directed. Finish the full course of medicine prescribed by your doctor or health care professional even if you think your condition is better. Talk to your pediatrician regarding the use of this medicine in children. Special care may be needed. Overdosage: If you think you have taken too much of this medicine contact a poison control center or emergency room at once. NOTE: This medicine is only for you. Do not share this medicine with others. What if I miss a dose? If you miss a dose, take it as soon as you can. If it is almost time for your next dose, take only that dose. Do not take double or extra doses. What may interact with this medicine? Interactions are not expected. Do not use any other nose products without telling your doctor or  health care professional. This list may not describe all possible interactions. Give your health care provider a list of all the medicines, herbs, non-prescription drugs, or dietary supplements you use. Also tell them if you smoke, drink alcohol, or use illegal drugs. Some items may interact with your medicine. What should I watch for while using this medicine? If your nose is severely irritated, burning or stinging from use of this medicine, stop using it and contact your doctor or health care professional. Do not get this medicine in your eyes. If you do, rinse out with plenty of cool tap water. What side effects may I notice from receiving this medicine? Side effects that you should report to your doctor or health care professional as soon as possible: -severe irritation, burning, stinging, or pain Side effects that usually do not require medical attention (report to your doctor or health care professional if they continue or are bothersome): -altered taste -cough -headache -skin itching -sore throat -stuffy or runny nose This list may not describe all possible side effects. Call your doctor for medical advice about side effects. You may report side effects to FDA at 1-800-FDA-1088. Where should I keep my medicine? Keep out of the reach of children. Store at room temperature between 15 and 30 degrees C (59 and 86 degrees F). Do not refrigerate. One tube of ointment is for single use in both nostrils. Throw away after use. NOTE: This sheet is a summary. It may not cover all possible information. If you have questions about this medicine, talk to your doctor, pharmacist, or  health care provider.  2018 Elsevier/Gold Standard (2007-12-15 14:36:10) Pacemaker Battery Change, Care After This sheet gives you information about how to care for yourself after your procedure. Your health care provider may also give you more specific instructions. If you have problems or questions, contact your health  care provider. What can I expect after the procedure? After your procedure, it is common to have:  Pain or soreness at the site where the pacemaker was inserted.  Swelling at the site where the pacemaker was inserted.  Follow these instructions at home: Incision care  Keep the incision clean and dry. ? Do not take baths, swim, or use a hot tub until your health care provider approves. ? You may shower the day after your procedure, or as directed by your health care provider. ? Pat the area dry with a clean towel. Do not rub the area. This may cause bleeding.  Follow instructions from your health care provider about how to take care of your incision. Make sure you: ? Wash your hands with soap and water before you change your bandage (dressing). If soap and water are not available, use hand sanitizer. ? Change your dressing as told by your health care provider. ? Leave stitches (sutures), skin glue, or adhesive strips in place. These skin closures may need to stay in place for 2 weeks or longer. If adhesive strip edges start to loosen and curl up, you may trim the loose edges. Do not remove adhesive strips completely unless your health care provider tells you to do that.  Check your incision area every day for signs of infection. Check for: ? More redness, swelling, or pain. ? More fluid or blood. ? Warmth. ? Pus or a bad smell. Activity  Do not lift anything that is heavier than 10 lb (4.5 kg) until your health care provider says it is okay to do so.  For the first 2 weeks, or as long as told by your health care provider: ? Avoid lifting your left arm higher than your shoulder. ? Be gentle when you move your arms over your head. It is okay to raise your arm to comb your hair. ? Avoid strenuous exercise.  Ask your health care provider when it is okay to: ? Resume your normal activities. ? Return to work or school. ? Resume sexual activity. Eating and drinking  Eat a  heart-healthy diet. This should include plenty of fresh fruits and vegetables, whole grains, low-fat dairy products, and lean protein like chicken and fish.  Limit alcohol intake to no more than 1 drink a day for non-pregnant women and 2 drinks a day for men. One drink equals 12 oz of beer, 5 oz of wine, or 1 oz of hard liquor.  Check ingredients and nutrition facts on packaged foods and beverages. Avoid the following types of food: ? Food that is high in salt (sodium). ? Food that is high in saturated fat, like full-fat dairy or red meat. ? Food that is high in trans fat, like fried food. ? Food and drinks that are high in sugar. Lifestyle  Do not use any products that contain nicotine or tobacco, such as cigarettes and e-cigarettes. If you need help quitting, ask your health care provider.  Take steps to manage and control your weight.  Get regular exercise. Aim for 150 minutes of moderate-intensity exercise (such as walking or yoga) or 75 minutes of vigorous exercise (such as running or swimming) each week.  Manage other health problems,  such as diabetes or high blood pressure. Ask your health care provider how you can manage these conditions. General instructions  Do not drive for 24 hours after your procedure if you were given a medicine to help you relax (sedative).  Take over-the-counter and prescription medicines only as told by your health care provider.  Avoid putting pressure on the area where the pacemaker was placed.  If you need an MRI after your pacemaker has been placed, be sure to tell the health care provider who orders the MRI that you have a pacemaker.  Avoid close and prolonged exposure to electrical devices that have strong magnetic fields. These include: ? Cell phones. Avoid keeping them in a pocket near the pacemaker, and try using the ear opposite the pacemaker. ? MP3 players. ? Household appliances, like microwaves. ? Metal detectors. ? Electric  generators. ? High-tension wires.  Keep all follow-up visits as directed by your health care provider. This is important. Contact a health care provider if:  You have pain at the incision site that is not relieved by over-the-counter or prescription medicines.  You have any of these around your incision site or coming from it: ? More redness, swelling, or pain. ? Fluid or blood. ? Warmth to the touch. ? Pus or a bad smell.  You have a fever.  You feel brief, occasional palpitations, light-headedness, or any symptoms that you think might be related to your heart. Get help right away if:  You experience chest pain that is different from the pain at the pacemaker site.  You develop a red streak that extends above or below the incision site.  You experience shortness of breath.  You have palpitations or an irregular heartbeat.  You have light-headedness that does not go away quickly.  You faint or have dizzy spells.  Your pulse suddenly drops or increases rapidly and does not return to normal.  You begin to gain weight and your legs and ankles swell. Summary  After your procedure, it is common to have pain, soreness, and some swelling where the pacemaker was inserted.  Make sure to keep your incision clean and dry. Follow instructions from your health care provider about how to take care of your incision.  Check your incision every day for signs of infection, such as more pain or swelling, pus or a bad smell, warmth, or leaking fluid and blood.  Avoid strenuous exercise and lifting your left arm higher than your shoulder for 2 weeks, or as long as told by your health care provider. This information is not intended to replace advice given to you by your health care provider. Make sure you discuss any questions you have with your health care provider. Document Released: 03/18/2013 Document Revised: 04/19/2016 Document Reviewed: 04/19/2016 Elsevier Interactive Patient Education   2017 [employer].

## 2017-10-04 NOTE — Interval H&P Note (Signed)
History and Physical Interval Note:  10/04/2017 7:31 AM  [name]  has presented today for surgery, with the diagnosis of eri  The various methods of treatment have been discussed with the patient and family. After consideration of risks, benefits and other options for treatment, the patient has consented to  Procedure(s): PPM GENERATOR CHANGEOUT (N/A) as a surgical intervention .  The patient's history has been reviewed, patient examined, no change in status, stable for surgery.  I have reviewed the patient's chart and labs.  Questions were answered to the patient's satisfaction.     [name]

## 2017-10-07 MED FILL — Gentamicin Sulfate Inj 40 MG/ML: INTRAMUSCULAR | Qty: 80 | Status: AC

## 2017-10-07 MED FILL — Cefazolin Sodium-Dextrose IV Solution 2 GM/100ML-4%: INTRAVENOUS | Qty: 100 | Status: AC

## 2017-10-10 DIAGNOSIS — R29898 Other symptoms and signs involving the musculoskeletal system: Secondary | ICD-10-CM | POA: Diagnosis not present

## 2017-10-10 DIAGNOSIS — E1142 Type 2 diabetes mellitus with diabetic polyneuropathy: Secondary | ICD-10-CM | POA: Diagnosis not present

## 2017-10-10 DIAGNOSIS — R260 Ataxic gait: Secondary | ICD-10-CM | POA: Diagnosis not present

## 2017-10-10 DIAGNOSIS — G629 Polyneuropathy, unspecified: Secondary | ICD-10-CM | POA: Diagnosis not present

## 2017-10-10 DIAGNOSIS — R2681 Unsteadiness on feet: Secondary | ICD-10-CM | POA: Diagnosis not present

## 2017-10-10 DIAGNOSIS — R269 Unspecified abnormalities of gait and mobility: Secondary | ICD-10-CM | POA: Diagnosis not present

## 2017-10-14 ENCOUNTER — Ambulatory Visit (INDEPENDENT_AMBULATORY_CARE_PROVIDER_SITE_OTHER): Payer: Medicare Other | Admitting: *Deleted

## 2017-10-14 DIAGNOSIS — I4821 Permanent atrial fibrillation: Secondary | ICD-10-CM

## 2017-10-14 DIAGNOSIS — I482 Chronic atrial fibrillation: Secondary | ICD-10-CM

## 2017-10-14 LAB — CUP PACEART INCLINIC DEVICE CHECK
Battery Remaining Longevity: 104 mo
Battery Voltage: 3.13 V
Brady Statistic RA Percent Paced: 0 %
Brady Statistic RV Percent Paced: 99.64 %
Implantable Lead Implant Date: 20090127
Implantable Lead Location: 753860
Implantable Pulse Generator Implant Date: 20190426
Lead Channel Impedance Value: 387.5 Ohm
Lead Channel Pacing Threshold Amplitude: 0.75 V
Lead Channel Pacing Threshold Pulse Width: 0.5 ms
Lead Channel Setting Pacing Pulse Width: 0.5 ms
Lead Channel Setting Sensing Sensitivity: 2.5 mV
MDC IDC LEAD IMPLANT DT: 20090127
MDC IDC LEAD LOCATION: 753859
MDC IDC MSMT LEADCHNL RV PACING THRESHOLD AMPLITUDE: 0.75 V
MDC IDC MSMT LEADCHNL RV PACING THRESHOLD PULSEWIDTH: 0.5 ms
MDC IDC MSMT LEADCHNL RV SENSING INTR AMPL: 7.6 mV
MDC IDC PG SERIAL: 9019173
MDC IDC SESS DTM: 20190506151459
MDC IDC SET LEADCHNL RV PACING AMPLITUDE: 2.5 V

## 2017-10-14 NOTE — Progress Notes (Signed)
Wound check appointment. Dermabond removed. Wound without redness or edema. Incision edges approximated, wound well healed. Normal device function. Threshold, sensing, and impedance consistent with implant measurements. Device programmed at appropriate safety margins. Histogram distribution appropriate for patient and level of activity. Permanent AF + Eliquis. No high ventricular rates noted. Patient educated about wound care and arm mobility. ROV in 3 months with SK.

## 2017-10-18 ENCOUNTER — Telehealth: Payer: Self-pay | Admitting: Family Medicine

## 2017-10-18 ENCOUNTER — Other Ambulatory Visit: Payer: Self-pay | Admitting: *Deleted

## 2017-10-18 DIAGNOSIS — H9193 Unspecified hearing loss, bilateral: Secondary | ICD-10-CM

## 2017-10-18 NOTE — Telephone Encounter (Signed)
Referral placed for Ent

## 2017-10-24 ENCOUNTER — Telehealth: Payer: Self-pay | Admitting: Internal Medicine

## 2017-10-24 NOTE — Telephone Encounter (Signed)
New Message  Pt's daughter states that the pt just had his pacer put in and now he is really SOB  Pt c/o Shortness Of Breath: STAT if SOB developed within the last 24 hours or pt is noticeably SOB on the phone  1. Are you currently SOB (can you hear that pt is SOB on the phone)? Daughter on the phone  2. How long have you been experiencing SOB? About 2 days  3. Are you SOB when sitting or when up moving around? Moving around  4. Are you currently experiencing any other symptoms? Not sure

## 2017-10-24 NOTE — Telephone Encounter (Signed)
Daughter is calling to let you know the report has been sent. Please advise

## 2017-10-24 NOTE — Telephone Encounter (Signed)
Spoke with pt's daughter regarding pt's SOB. She states his nurses aid reported he had an increase in SOB recently when he ambulates. Pt's daughter states she will let me know when she sends in a remote check. We will check his avg HR to be sure this is not the cause of his SOB. Will review with Dr Caryl Comes when he is in the office today. Pt's daughter verbalized understanding and will call when she sends in the transmission.

## 2017-10-24 NOTE — Telephone Encounter (Signed)
Remote transmission reviewed. Dual chamber device programmed VVI. Presenting rhythm shows AF

## 2017-10-24 NOTE — Telephone Encounter (Signed)
Pt's daughter, [name], went to pt's house to evaluate his SOB. She states he only complains of it when he is straining on the toilet. I assured her his HR looked like the rate has been controlled. I suggested he visit his PCP for further evaluation. She agreed and verbalized understanding. She had no additional questions.

## 2017-11-01 DIAGNOSIS — H6122 Impacted cerumen, left ear: Secondary | ICD-10-CM | POA: Diagnosis not present

## 2017-11-01 DIAGNOSIS — H6521 Chronic serous otitis media, right ear: Secondary | ICD-10-CM | POA: Diagnosis not present

## 2017-11-01 DIAGNOSIS — H903 Sensorineural hearing loss, bilateral: Secondary | ICD-10-CM | POA: Diagnosis not present

## 2017-11-28 ENCOUNTER — Other Ambulatory Visit: Payer: Self-pay

## 2017-11-28 ENCOUNTER — Telehealth: Payer: Self-pay | Admitting: Family Medicine

## 2017-11-28 ENCOUNTER — Other Ambulatory Visit: Payer: Self-pay | Admitting: Family Medicine

## 2017-11-28 DIAGNOSIS — F411 Generalized anxiety disorder: Secondary | ICD-10-CM

## 2017-11-28 NOTE — Telephone Encounter (Signed)
Last seen 09/16/17

## 2017-11-28 NOTE — Telephone Encounter (Signed)
PT Aid called and I got verbal permission from the pt to speak to Aid, Katharine Look the aid states that dr Wendi Snipes does something to were the medication is sent to [place] then is sent to the [facility], states the pt is needing refills on synthroiad 175 mg and xanax 0.5 mg both 90 day supply

## 2017-11-28 NOTE — Telephone Encounter (Signed)
Requesting refill of Xanax- please advise

## 2017-11-28 NOTE — Telephone Encounter (Signed)
[name] has called back again and said the synthroaid medication was sent in but his xanax has not been sent in.

## 2017-11-29 MED ORDER — ALPRAZOLAM 0.5 MG PO TABS: 0.5000 mg | ORAL_TABLET | Freq: Every day | ORAL | 0 refills | Status: DC

## 2017-12-05 DIAGNOSIS — H903 Sensorineural hearing loss, bilateral: Secondary | ICD-10-CM | POA: Diagnosis not present

## 2018-01-20 ENCOUNTER — Encounter: Payer: Self-pay | Admitting: Internal Medicine

## 2018-01-21 ENCOUNTER — Encounter: Payer: Medicare Other | Admitting: Internal Medicine

## 2018-01-22 ENCOUNTER — Encounter: Payer: Self-pay | Admitting: Internal Medicine

## 2018-02-03 ENCOUNTER — Encounter: Payer: Self-pay | Admitting: Nurse Practitioner

## 2018-02-03 NOTE — Progress Notes (Signed)
Electrophysiology Office Note Date: 02/05/2018  ID:  [name], [name] 1921/12/30, MRN [id number]  PCP: Carlos Euler, MD Electrophysiologist: [name]   CC: Pacemaker follow-up  [name] is a 84 y.o. male seen today for Dr [name].  He presents today for routine electrophysiology followup.  Since last being seen in our clinic, the patient reports doing reasonably well.  He is very hard of hearing and his daughter helps with ROS today.  He is mostly wheelchair bound.  His eyesight is also limited. He has been struggling with urinary insentience and Dr [name] has suggested a procedure to help.   He denies chest pain, palpitations, dyspnea, PND, orthopnea, nausea, vomiting, dizziness, syncope, edema, weight gain, or early satiety.  Device History: STJ dual chamber PPM implanted 2009 for complete heart block; gen change 2019   Past Medical History:  Diagnosis Date  . Blindness of right eye    Related to trauma in [place]  . CAD (coronary artery disease)    (non Q wave MI in Jan 2009. He had chronically ocluded right coronary artery. He had a LAD w/90% stenosis at the take off of the first diagona. He had a drug-eluding stent palced by Dr Burt Knack)  . Cancer [facility])    prostate ca with radiation in 2005  . Cellulitis of face feb 2011   prostetic rt eye, cellulitis of rt side of face  . Complete heart block (HCC)    a. s/p STJ dual chamber PPM   . Diabetes mellitus without complication ([place])   . Dyslipidemia   . Hypertension   . Hypothyroid   . Macular degeneration   . Permanent atrial fibrillation [facility])    Past Surgical History:  Procedure Laterality Date  . eye removed Right 1997  . PPM GENERATOR CHANGEOUT N/A 10/04/2017   Procedure: PPM GENERATOR CHANGEOUT;  Surgeon: Deboraha Sprang, MD;  Location: [place] [facility];  Service: Cardiovascular;  Laterality: N/A;    Current Outpatient Medications  Medication Sig Dispense Refill  . ALPRAZolam (XANAX) 0.5 MG tablet Take 1  tablet (0.5 mg total) by mouth at bedtime. for sleep 90 tablet 0  . apixaban (ELIQUIS) 2.5 MG TABS tablet Take 1 tablet (2.5 mg total) by mouth 2 (two) times daily. 180 tablet 5  . Carboxymethylcellulose Sodium (THERATEARS OP) Place 1 drop into both eyes daily as needed (for dry eyes).    . Cholecalciferol (VITAMIN D3) 5000 units CAPS Take 5,000 Units by mouth daily.    Marland Kitchen docusate sodium (COLACE) 100 MG capsule Take 100 mg by mouth daily.    Marland Kitchen glucose blood test strip Test BS BID and PRN. Dx e11.9 300 each 3  . ketoconazole (NIZORAL) 2 % shampoo Apply 1 application topically 3 (three) times a week.    . levothyroxine (SYNTHROID, LEVOTHROID) 175 MCG tablet TAKE (1) TABLET DAILY BE- FORE BREAKFAST. 90 tablet 1  . mirabegron ER (MYRBETRIQ) 25 MG TB24 tablet Take 25 mg by mouth 2 (two) times daily.     . Multiple Vitamins-Minerals (MULTIVITAMIN PO) Take 1 tablet by mouth daily.    . Multiple Vitamins-Minerals (ZINC PO) Take 1 tablet by mouth every other day.    . nitroGLYCERIN (NITROSTAT) 0.4 MG SL tablet Place 1 tablet (0.4 mg total) under the tongue every 5 (five) minutes as needed for chest pain (Do not exceed 3 doses). 25 tablet 3  . Potassium 99 MG TABS Take 99 mg by mouth daily.  No current facility-administered medications for this visit.     Allergies:   Patient has no known allergies.   Social History: Social History   Socioeconomic History  . Marital status: Widowed    Spouse name: Not on file  . Number of children: Not on file  . Years of education: Not on file  . Highest education level: Not on file  Occupational History  . 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 Financial resource strain: Not on file  . Food insecurity:    Worry: Not on file    Inability: Not on file  . Transportation needs:    Medical: Not on file    Non-medical: Not on file  Tobacco Use  . Smoking status: Former Smoker    Packs/day: 2.00     Years: 65.00    Pack years: 130.00    Types: Cigarettes    Last attempt to quit: 09/11/2000    Years since quitting: 17.4  . Smokeless tobacco: Never Used  Substance and Sexual Activity  . Alcohol use: Yes    Alcohol/week: 1.0 standard drinks    Types: 1 Cans of beer per week    Comment: occ  . Drug use: No  . Sexual activity: Not on file  Lifestyle  . Physical activity:    Days per week: Not on file    Minutes per session: Not on file  . Stress: Not on file  Relationships  . Social connections:    Talks on phone: Not on file    Gets together: Not on file    Attends religious service: Not on file    Active member of club or organization: Not on file    Attends meetings of clubs or organizations: Not on file    Relationship status: Not on file  . Intimate partner violence:    Fear of current or ex partner: Not on file    Emotionally abused: Not on file    Physically abused: Not on file    Forced sexual activity: Not on file  Other Topics Concern  . Not on file  Social History Narrative   Married w/3 children, lives with wife.           Family History: Family History  Problem Relation Age of Onset  . Heart disease Father      Review of Systems: All other systems reviewed and are otherwise negative except as noted above.   Physical Exam: VS:  BP (!) 126/56   Pulse 76   Ht 6\' 2"  (1.88 m)   Wt 204 lb (92.5 kg) Comment: Pt stated weight  SpO2 98%   BMI 26.19 kg/m  , BMI Body mass index is 26.19 kg/m.  GEN- The patient is elderly appearing, alert and oriented x 3 today, +HOH.   HEENT: normocephalic, atraumatic; sclera clear, conjunctiva pink; hearing intact; oropharynx clear; neck supple  Lungs- Clear to ausculation bilaterally, normal work of breathing.  No wheezes, rales, rhonchi Heart- Regular rate and rhythm (paced) GI- soft, non-tender, non-distended, bowel sounds present  Extremities- no clubbing, cyanosis, or edema  MS- no significant deformity or  atrophy Skin- warm and dry, no rash or lesion; PPM pocket well healed Psych- euthymic mood, full affect Neuro- strength and sensation are intact  PPM Interrogation- reviewed in detail today,  See PACEART report  EKG:  EKG is not ordered today.  Recent Labs: 05/24/2017: TSH 11.510 07/05/2017: ALT 15  09/23/2017: BUN 28; Creatinine, Ser 1.63; Hemoglobin 12.2; Platelets 271; Potassium 4.8; Sodium 140   Wt Readings from Last 3 Encounters:  02/05/18 204 lb (92.5 kg)  10/04/17 204 lb (92.5 kg)  09/23/17 205 lb (93 kg)     Other studies Reviewed: Additional studies/ records that were reviewed today include: Dr Olin Pia office notes  Assessment and Plan:  1.  Complete heart block Normal PPM function See Claudia Desanctis Art report No changes today It is very difficult for him to travel to appointments, will plan to follow remotely and no further in office visits.  2.  Permanent atrial fibrillation Continue Eliquis for CHADS2VASC of 4 (correct dose for age and creatinine)  3.  CAD No recent ischemic symptoms Continue current therapy   4.  Clearance He is pending a urological procedure to help with quality of life 2/2 urinary incontinence. He is at increased risk for any procedure with advanced age and I reviewed that with patient and his daughter today.  I do not think there is any testing that we would do to limit those risks.  I have advised them to consider if quality of life issues in deciding whether or not to have surgery.  They would like to proceed and will contact Dr Ralene Muskrat office. I will send over copy of my note today as well.     Current medicines are reviewed at length with the patient today.   The patient does not have concerns regarding his medicines.  The following changes were made today:  none  Labs/ tests ordered today include: none Orders Placed This Encounter  Procedures  . CUP PACEART INCLINIC DEVICE CHECK     Disposition:   Follow up with Delilah Shan, in office as  needed   Signed, Chanetta Marshall, NP 02/05/2018 11:27 AM  [place] [address] [place] [address] [phone] (office) [phone] (fax)

## 2018-02-05 ENCOUNTER — Encounter: Payer: Self-pay | Admitting: Nurse Practitioner

## 2018-02-05 ENCOUNTER — Ambulatory Visit (INDEPENDENT_AMBULATORY_CARE_PROVIDER_SITE_OTHER): Payer: Medicare Other | Admitting: Nurse Practitioner

## 2018-02-05 ENCOUNTER — Encounter (INDEPENDENT_AMBULATORY_CARE_PROVIDER_SITE_OTHER): Payer: Self-pay

## 2018-02-05 VITALS — BP 126/56 | HR 76 | Ht 74.0 in | Wt 204.0 lb

## 2018-02-05 DIAGNOSIS — I482 Chronic atrial fibrillation: Secondary | ICD-10-CM

## 2018-02-05 DIAGNOSIS — I5032 Chronic diastolic (congestive) heart failure: Secondary | ICD-10-CM | POA: Diagnosis not present

## 2018-02-05 DIAGNOSIS — I4821 Permanent atrial fibrillation: Secondary | ICD-10-CM

## 2018-02-05 DIAGNOSIS — I442 Atrioventricular block, complete: Secondary | ICD-10-CM

## 2018-02-05 DIAGNOSIS — I2510 Atherosclerotic heart disease of native coronary artery without angina pectoris: Secondary | ICD-10-CM

## 2018-02-05 LAB — CUP PACEART INCLINIC DEVICE CHECK
Date Time Interrogation Session: 20190828111721
Implantable Lead Implant Date: 20090127
Implantable Lead Implant Date: 20090127
Implantable Lead Location: 753859
Implantable Pulse Generator Implant Date: 20190426
MDC IDC LEAD LOCATION: 753860
Pulse Gen Model: 2272
Pulse Gen Serial Number: 9019173

## 2018-02-05 NOTE — Patient Instructions (Addendum)
.  Medication Instructions:   Your physician recommends that you continue on your current medications as directed. Please refer to the Current Medication list given to you today.   If you need a refill on your cardiac medications before your next appointment, please call your pharmacy.  Labwork: NONE ORDERED  TODAY    Testing/Procedures: NONE ORDERED  TODAY    Follow-Up: Remote monitoring is used to monitor your Pacemaker of ICD from home. This monitoring reduces the number of office visits required to check your device to one time per year. It allows [place] to keep an eye on the functioning of your device to ensure it is working properly. You are scheduled for a device check from home on .05-07-18 You may send your transmission at any time that day. If you have a wireless device, the transmission will be sent automatically. After your physician reviews your transmission, you will receive a postcard with your next transmission date.  Your physician wants you to follow-up in: [place] will receive a reminder letter in the mail two months in advance. If you don't receive a letter, please call our office to schedule the follow-up appointment.     Any Other Special Instructions Will Be Listed Below (If Applicable).

## 2018-02-16 ENCOUNTER — Encounter (HOSPITAL_COMMUNITY): Payer: Self-pay | Admitting: Emergency Medicine

## 2018-02-16 ENCOUNTER — Inpatient Hospital Stay (HOSPITAL_COMMUNITY)
Admission: EM | Admit: 2018-02-16 | Discharge: 2018-02-20 | DRG: 281 | Disposition: A | Discharge status: Skilled Nursing Facility | Payer: Medicare Other | Attending: Internal Medicine | Admitting: Internal Medicine

## 2018-02-16 ENCOUNTER — Emergency Department (HOSPITAL_COMMUNITY): Payer: Medicare Other

## 2018-02-16 ENCOUNTER — Observation Stay (HOSPITAL_COMMUNITY): Payer: Medicare Other

## 2018-02-16 DIAGNOSIS — E039 Hypothyroidism, unspecified: Secondary | ICD-10-CM | POA: Diagnosis not present

## 2018-02-16 DIAGNOSIS — E785 Hyperlipidemia, unspecified: Secondary | ICD-10-CM | POA: Diagnosis not present

## 2018-02-16 DIAGNOSIS — R32 Unspecified urinary incontinence: Secondary | ICD-10-CM | POA: Diagnosis present

## 2018-02-16 DIAGNOSIS — L8990 Pressure ulcer of unspecified site, unspecified stage: Secondary | ICD-10-CM

## 2018-02-16 DIAGNOSIS — Z23 Encounter for immunization: Secondary | ICD-10-CM

## 2018-02-16 DIAGNOSIS — E1129 Type 2 diabetes mellitus with other diabetic kidney complication: Secondary | ICD-10-CM | POA: Diagnosis present

## 2018-02-16 DIAGNOSIS — R531 Weakness: Secondary | ICD-10-CM | POA: Diagnosis not present

## 2018-02-16 DIAGNOSIS — N289 Disorder of kidney and ureter, unspecified: Secondary | ICD-10-CM | POA: Diagnosis not present

## 2018-02-16 DIAGNOSIS — I25810 Atherosclerosis of coronary artery bypass graft(s) without angina pectoris: Secondary | ICD-10-CM | POA: Diagnosis present

## 2018-02-16 DIAGNOSIS — I214 Non-ST elevation (NSTEMI) myocardial infarction: Secondary | ICD-10-CM | POA: Diagnosis not present

## 2018-02-16 DIAGNOSIS — Z79890 Hormone replacement therapy (postmenopausal): Secondary | ICD-10-CM

## 2018-02-16 DIAGNOSIS — W19XXXA Unspecified fall, initial encounter: Secondary | ICD-10-CM | POA: Diagnosis not present

## 2018-02-16 DIAGNOSIS — E1122 Type 2 diabetes mellitus with diabetic chronic kidney disease: Secondary | ICD-10-CM | POA: Diagnosis not present

## 2018-02-16 DIAGNOSIS — I442 Atrioventricular block, complete: Secondary | ICD-10-CM | POA: Diagnosis present

## 2018-02-16 DIAGNOSIS — I482 Chronic atrial fibrillation: Secondary | ICD-10-CM | POA: Diagnosis not present

## 2018-02-16 DIAGNOSIS — Z950 Presence of cardiac pacemaker: Secondary | ICD-10-CM | POA: Diagnosis present

## 2018-02-16 DIAGNOSIS — Z87891 Personal history of nicotine dependence: Secondary | ICD-10-CM

## 2018-02-16 DIAGNOSIS — F411 Generalized anxiety disorder: Secondary | ICD-10-CM | POA: Diagnosis not present

## 2018-02-16 DIAGNOSIS — I2510 Atherosclerotic heart disease of native coronary artery without angina pectoris: Secondary | ICD-10-CM | POA: Diagnosis present

## 2018-02-16 DIAGNOSIS — I130 Hypertensive heart and chronic kidney disease with heart failure and stage 1 through stage 4 chronic kidney disease, or unspecified chronic kidney disease: Secondary | ICD-10-CM | POA: Diagnosis present

## 2018-02-16 DIAGNOSIS — Z923 Personal history of irradiation: Secondary | ICD-10-CM

## 2018-02-16 DIAGNOSIS — H3530 Unspecified macular degeneration: Secondary | ICD-10-CM | POA: Diagnosis present

## 2018-02-16 DIAGNOSIS — H548 Legal blindness, as defined in USA: Secondary | ICD-10-CM | POA: Diagnosis present

## 2018-02-16 DIAGNOSIS — I5020 Unspecified systolic (congestive) heart failure: Secondary | ICD-10-CM | POA: Diagnosis present

## 2018-02-16 DIAGNOSIS — R778 Other specified abnormalities of plasma proteins: Secondary | ICD-10-CM | POA: Diagnosis present

## 2018-02-16 DIAGNOSIS — I252 Old myocardial infarction: Secondary | ICD-10-CM | POA: Diagnosis present

## 2018-02-16 DIAGNOSIS — Z955 Presence of coronary angioplasty implant and graft: Secondary | ICD-10-CM

## 2018-02-16 DIAGNOSIS — R42 Dizziness and giddiness: Secondary | ICD-10-CM | POA: Diagnosis not present

## 2018-02-16 DIAGNOSIS — R7989 Other specified abnormal findings of blood chemistry: Secondary | ICD-10-CM | POA: Diagnosis present

## 2018-02-16 DIAGNOSIS — Z8546 Personal history of malignant neoplasm of prostate: Secondary | ICD-10-CM

## 2018-02-16 DIAGNOSIS — R296 Repeated falls: Secondary | ICD-10-CM | POA: Diagnosis present

## 2018-02-16 DIAGNOSIS — I480 Paroxysmal atrial fibrillation: Secondary | ICD-10-CM | POA: Diagnosis present

## 2018-02-16 DIAGNOSIS — H9190 Unspecified hearing loss, unspecified ear: Secondary | ICD-10-CM | POA: Diagnosis present

## 2018-02-16 DIAGNOSIS — Z7189 Other specified counseling: Secondary | ICD-10-CM

## 2018-02-16 DIAGNOSIS — R4182 Altered mental status, unspecified: Secondary | ICD-10-CM | POA: Diagnosis not present

## 2018-02-16 DIAGNOSIS — Z7901 Long term (current) use of anticoagulants: Secondary | ICD-10-CM

## 2018-02-16 DIAGNOSIS — Z79899 Other long term (current) drug therapy: Secondary | ICD-10-CM

## 2018-02-16 DIAGNOSIS — N184 Chronic kidney disease, stage 4 (severe): Secondary | ICD-10-CM | POA: Diagnosis present

## 2018-02-16 DIAGNOSIS — I4891 Unspecified atrial fibrillation: Secondary | ICD-10-CM | POA: Diagnosis present

## 2018-02-16 DIAGNOSIS — Z515 Encounter for palliative care: Secondary | ICD-10-CM

## 2018-02-16 DIAGNOSIS — R627 Adult failure to thrive: Secondary | ICD-10-CM | POA: Diagnosis present

## 2018-02-16 DIAGNOSIS — Z66 Do not resuscitate: Secondary | ICD-10-CM | POA: Diagnosis present

## 2018-02-16 HISTORY — DX: Chronic kidney disease, stage 4 (severe): N18.4

## 2018-02-16 LAB — CBC WITH DIFFERENTIAL/PLATELET
ABS IMMATURE GRANULOCYTES: 0 10*3/uL (ref 0.0–0.1)
BASOS ABS: 0.1 10*3/uL (ref 0.0–0.1)
BASOS PCT: 1 %
Eosinophils Absolute: 0.2 10*3/uL (ref 0.0–0.7)
Eosinophils Relative: 2 %
HCT: 40.3 % (ref 39.0–52.0)
Hemoglobin: 12.5 g/dL — ABNORMAL LOW (ref 13.0–17.0)
Immature Granulocytes: 0 %
Lymphocytes Relative: 18 %
Lymphs Abs: 1.4 10*3/uL (ref 0.7–4.0)
MCH: 28 pg (ref 26.0–34.0)
MCHC: 31 g/dL (ref 30.0–36.0)
MCV: 90.2 fL (ref 78.0–100.0)
Monocytes Absolute: 0.9 10*3/uL (ref 0.1–1.0)
Monocytes Relative: 11 %
NEUTROS ABS: 5.3 10*3/uL (ref 1.7–7.7)
Neutrophils Relative %: 68 %
PLATELETS: 221 10*3/uL (ref 150–400)
RBC: 4.47 MIL/uL (ref 4.22–5.81)
RDW: 14 % (ref 11.5–15.5)
WBC: 7.8 10*3/uL (ref 4.0–10.5)

## 2018-02-16 LAB — COMPREHENSIVE METABOLIC PANEL
ALBUMIN: 3.5 g/dL (ref 3.5–5.0)
ALT: 13 U/L (ref 0–44)
ANION GAP: 14 (ref 5–15)
AST: 22 U/L (ref 15–41)
Alkaline Phosphatase: 49 U/L (ref 38–126)
BUN: 35 mg/dL — ABNORMAL HIGH (ref 8–23)
CO2: 21 mmol/L — AB (ref 22–32)
Calcium: 9.2 mg/dL (ref 8.9–10.3)
Chloride: 105 mmol/L (ref 98–111)
Creatinine, Ser: 1.58 mg/dL — ABNORMAL HIGH (ref 0.61–1.24)
GFR calc non Af Amer: 36 mL/min — ABNORMAL LOW (ref >60)
GFR, EST AFRICAN AMERICAN: 42 mL/min — AB (ref >60)
GLUCOSE: 94 mg/dL (ref 70–99)
POTASSIUM: 4.2 mmol/L (ref 3.5–5.1)
SODIUM: 140 mmol/L (ref 135–145)
Total Bilirubin: 0.7 mg/dL (ref 0.3–1.2)
Total Protein: 6.9 g/dL (ref 6.5–8.1)

## 2018-02-16 LAB — URINALYSIS, ROUTINE W REFLEX MICROSCOPIC
BACTERIA UA: NONE SEEN
Bilirubin Urine: NEGATIVE
GLUCOSE, UA: NEGATIVE mg/dL
Ketones, ur: NEGATIVE mg/dL
NITRITE: NEGATIVE
PH: 5 (ref 5.0–8.0)
PROTEIN: NEGATIVE mg/dL
Specific Gravity, Urine: 1.017 (ref 1.005–1.030)

## 2018-02-16 LAB — TROPONIN I
Troponin I: 0.18 ng/mL (ref <0.03)
Troponin I: 0.18 ng/mL (ref <0.03)

## 2018-02-16 MED ORDER — HYDRALAZINE HCL 20 MG/ML IJ SOLN: 5.0000 mg | INTRAMUSCULAR | Status: DC | PRN

## 2018-02-16 MED ORDER — MECLIZINE HCL 25 MG PO TABS: 12.5000 mg | ORAL_TABLET | Freq: Two times a day (BID) | ORAL | Status: DC | PRN

## 2018-02-16 MED ORDER — MIRABEGRON ER 25 MG PO TB24
25.0000 mg | ORAL_TABLET | Freq: Two times a day (BID) | ORAL | Status: DC
  Administered 2018-02-17 – 2018-02-20 (×8): 25 mg via ORAL
  Filled 2018-02-16 (×8): qty 1

## 2018-02-16 MED ORDER — ASPIRIN 81 MG PO CHEW
324.0000 mg | CHEWABLE_TABLET | Freq: Every day | ORAL | Status: DC
  Administered 2018-02-17 – 2018-02-20 (×4): 324 mg via ORAL
  Filled 2018-02-16 (×4): qty 4

## 2018-02-16 MED ORDER — MORPHINE SULFATE (PF) 2 MG/ML IV SOLN: 1.0000 mg | INTRAVENOUS | Status: DC | PRN

## 2018-02-16 MED ORDER — ZOLPIDEM TARTRATE 5 MG PO TABS
5.0000 mg | ORAL_TABLET | Freq: Every evening | ORAL | Status: DC | PRN
  Administered 2018-02-19: 5 mg via ORAL
  Filled 2018-02-16: qty 1

## 2018-02-16 MED ORDER — POLYVINYL ALCOHOL 1.4 % OP SOLN
1.0000 [drp] | Freq: Every day | OPHTHALMIC | Status: DC | PRN
  Administered 2018-02-18: 1 [drp] via OPHTHALMIC
  Filled 2018-02-16: qty 15

## 2018-02-16 MED ORDER — LEVOTHYROXINE SODIUM 175 MCG PO TABS
175.0000 ug | ORAL_TABLET | Freq: Every day | ORAL | Status: DC
  Administered 2018-02-17 – 2018-02-20 (×4): 175 ug via ORAL
  Filled 2018-02-16 (×5): qty 1

## 2018-02-16 MED ORDER — NITROGLYCERIN 0.4 MG SL SUBL: 0.4000 mg | SUBLINGUAL_TABLET | SUBLINGUAL | Status: DC | PRN

## 2018-02-16 MED ORDER — ALPRAZOLAM 0.25 MG PO TABS: 0.2500 mg | ORAL_TABLET | Freq: Two times a day (BID) | ORAL | Status: DC | PRN

## 2018-02-16 MED ORDER — SODIUM CHLORIDE 0.9 % IV BOLUS
500.0000 mL | Freq: Once | INTRAVENOUS | Status: AC
  Administered 2018-02-16: 500 mL via INTRAVENOUS

## 2018-02-16 MED ORDER — ADULT MULTIVITAMIN LIQUID CH
Freq: Every day | ORAL | Status: DC
  Administered 2018-02-17 – 2018-02-20 (×4): 15 mL via ORAL
  Filled 2018-02-16 (×5): qty 15

## 2018-02-16 MED ORDER — DOCUSATE SODIUM 100 MG PO CAPS
100.0000 mg | ORAL_CAPSULE | Freq: Every day | ORAL | Status: DC
  Administered 2018-02-18 – 2018-02-20 (×3): 100 mg via ORAL
  Filled 2018-02-16 (×6): qty 1

## 2018-02-16 MED ORDER — ASPIRIN 81 MG PO CHEW
324.0000 mg | CHEWABLE_TABLET | Freq: Once | ORAL | Status: AC
  Administered 2018-02-16: 324 mg via ORAL
  Filled 2018-02-16: qty 4

## 2018-02-16 MED ORDER — ALPRAZOLAM 0.25 MG PO TABS
0.2500 mg | ORAL_TABLET | Freq: Three times a day (TID) | ORAL | Status: DC | PRN
  Administered 2018-02-17: 0.25 mg via ORAL
  Filled 2018-02-16 (×2): qty 1

## 2018-02-16 MED ORDER — ATORVASTATIN CALCIUM 80 MG PO TABS
80.0000 mg | ORAL_TABLET | Freq: Every day | ORAL | Status: DC
  Administered 2018-02-17 – 2018-02-19 (×4): 80 mg via ORAL
  Filled 2018-02-16 (×4): qty 1

## 2018-02-16 MED ORDER — SODIUM CHLORIDE 0.9 % IV SOLN
INTRAVENOUS | Status: DC
  Administered 2018-02-17: 01:00:00 via INTRAVENOUS

## 2018-02-16 MED ORDER — ALPRAZOLAM 0.25 MG PO TABS: 0.5000 mg | ORAL_TABLET | Freq: Two times a day (BID) | ORAL | Status: DC | PRN

## 2018-02-16 MED ORDER — LORAZEPAM 1 MG PO TABS
1.0000 mg | ORAL_TABLET | Freq: Once | ORAL | Status: AC
  Administered 2018-02-16: 1 mg via ORAL
  Filled 2018-02-16: qty 1

## 2018-02-16 MED ORDER — HYDROXYZINE HCL 10 MG PO TABS
10.0000 mg | ORAL_TABLET | Freq: Three times a day (TID) | ORAL | Status: DC | PRN
  Filled 2018-02-16: qty 1

## 2018-02-16 MED ORDER — VITAMIN D 1000 UNITS PO TABS
5000.0000 [IU] | ORAL_TABLET | Freq: Every day | ORAL | Status: DC
  Administered 2018-02-17 – 2018-02-20 (×4): 5000 [IU] via ORAL
  Filled 2018-02-16 (×4): qty 5

## 2018-02-16 MED ORDER — APIXABAN 2.5 MG PO TABS
2.5000 mg | ORAL_TABLET | Freq: Two times a day (BID) | ORAL | Status: DC
  Administered 2018-02-17 – 2018-02-20 (×8): 2.5 mg via ORAL
  Filled 2018-02-16 (×8): qty 1

## 2018-02-16 MED ORDER — ACETAMINOPHEN 325 MG PO TABS: 650.0000 mg | ORAL_TABLET | ORAL | Status: DC | PRN

## 2018-02-16 MED ORDER — ZINC SULFATE 220 (50 ZN) MG PO CAPS
220.0000 mg | ORAL_CAPSULE | ORAL | Status: DC
  Administered 2018-02-17 – 2018-02-20 (×4): 220 mg via ORAL
  Filled 2018-02-16 (×5): qty 1

## 2018-02-16 NOTE — ED Notes (Signed)
Report called to Maeola Sarah RN.

## 2018-02-16 NOTE — ED Provider Notes (Signed)
[facility] EMERGENCY DEPARTMENT Provider Note   CSN: [id number] Arrival date & time: 02/16/18  1407     History   Chief Complaint Chief Complaint  Patient presents with  . Fall    HPI [name] is a 84 y.o. male.  HPI  84 year old male, history of permanent atrial fibrillation, complete heart block status post pacemaker, this pacemaker was changed in March to an MRI friendly pacemaker according to the family, he has a history of progressive weakness, frequent falls and in fact is fallen several times over the last couple of days.  The family members are the primary historians as the patient is both nearly deaf and completely blind, they report that he has been complaining of a progressive fullness in the head and the dizziness which seems to get worse over time, he has had multiple CT scans and evaluations in the past without definitive diagnosis.  His oral intake is become less and less over time, he is having urinary incontinence that is getting worse over time.  He does have nursing assistance which are in the house 5 days a week during the daylight hours.  The family reports that the symptoms are progressive, gradually worsening and have now become severe.  No vomiting, alternates between diarrhea and constipation, no cough, no other symptoms.  Level 5 caveat applies secondary to the patient's inability to communicate due to the loss of his senses  Past Medical History:  Diagnosis Date  . Blindness of right eye    Related to trauma in [place]  . CAD (coronary artery disease)    (non Q wave MI in Jan 2009. He had chronically ocluded right coronary artery. He had a LAD w/90% stenosis at the take off of the first diagona. He had a drug-eluding stent palced by Dr Burt Knack)  . Cancer [facility])    prostate ca with radiation in 2005  . Cellulitis of face feb 2011   prostetic rt eye, cellulitis of rt side of face  . Complete heart block (HCC)    a. s/p STJ dual chamber  PPM   . Diabetes mellitus without complication ([place])   . Dyslipidemia   . Hypertension   . Hypothyroid   . Macular degeneration   . Permanent atrial fibrillation [facility])     Patient Active Problem List   Diagnosis Date Noted  . OAB (overactive bladder) 05/08/2016  . Atherosclerotic PVD with ulceration ([place]) 11/23/2013  . Chronic ulcer of right great toe ([place]) 11/23/2013  . Depression 10/02/2013  . Anxiety state 10/02/2013  . Foot ulcer, right ([place]) 10/02/2013  . Heart burn 06/02/2013  . Cough 06/02/2013  . Chronic kidney disease (CKD), stage IV (severe) ([place]) 09/30/2012  . CAD (coronary artery disease) of artery bypass graft 09/30/2012  . Hypothyroidism 09/30/2012  . DM (diabetes mellitus) ([place]) 09/30/2012  . Atrial fibrillation-paroxysmal 09/12/2010  . Coronary artery disease- Status post drug-eluting stent 09/12/2010  . Hyperlipidemia 02/02/2010  . AV BLOCK, COMPLETE 02/02/2010  . ORTHOSTATIC DIZZINESS 02/02/2010  . DYSPNEA ON EXERTION 02/02/2010  . Pacemaker-St. Jude 02/02/2010    Past Surgical History:  Procedure Laterality Date  . eye removed Right 1997  . PPM GENERATOR CHANGEOUT N/A 10/04/2017   Procedure: PPM GENERATOR CHANGEOUT;  Surgeon: Deboraha Sprang, MD;  Location: [facility] CV LAB;  Service: Cardiovascular;  Laterality: N/A;        Home Medications    Prior to Admission medications   Medication Sig Start Date End Date  Taking? Authorizing Provider  ALPRAZolam Duanne Moron) 0.5 MG tablet Take 1 tablet (0.5 mg total) by mouth at bedtime. for sleep 11/29/17   [name], MD  apixaban (ELIQUIS) 2.5 MG TABS tablet Take 1 tablet (2.5 mg total) by mouth 2 (two) times daily. 07/24/16   [name], MD  Carboxymethylcellulose Sodium (THERATEARS OP) Place 1 drop into both eyes daily as needed (for dry eyes).    [provider]  Cholecalciferol (VITAMIN D3) 5000 units CAPS Take 5,000 Units by mouth daily.    [provider]  docusate sodium  (COLACE) 100 MG capsule Take 100 mg by mouth daily.    [provider]  glucose blood test strip Test BS BID and PRN. Dx e11.9 04/27/16   [name], MD  ketoconazole (NIZORAL) 2 % shampoo Apply 1 application topically 3 (three) times a week. 06/20/17   [provider]  levothyroxine (SYNTHROID, LEVOTHROID) 175 MCG tablet TAKE (1) TABLET DAILY BE- FORE BREAKFAST. 11/28/17   [name], MD  mirabegron ER (MYRBETRIQ) 25 MG TB24 tablet Take 25 mg by mouth 2 (two) times daily.     [provider]  Multiple Vitamins-Minerals (MULTIVITAMIN PO) Take 1 tablet by mouth daily.    [provider]  Multiple Vitamins-Minerals (ZINC PO) Take 1 tablet by mouth every other day.    [provider]  nitroGLYCERIN (NITROSTAT) 0.4 MG SL tablet Place 1 tablet (0.4 mg total) under the tongue every 5 (five) minutes as needed for chest pain (Do not exceed 3 doses). 09/10/16   [name], MD  Potassium 99 MG TABS Take 99 mg by mouth daily.     [provider]    Family History Family History  Problem Relation Age of Onset  . Heart disease Father     Social History Social History   Tobacco Use  . Smoking status: Former Smoker    Packs/day: 2.00    Years: 65.00    Pack years: 130.00    Types: Cigarettes    Last attempt to quit: 09/11/2000    Years since quitting: 17.4  . Smokeless tobacco: Never Used  Substance Use Topics  . Alcohol use: Yes    Alcohol/week: 1.0 standard drinks    Types: 1 Cans of beer per week    Comment: occ  . Drug use: No     Allergies   Patient has no known allergies.   Review of Systems Review of Systems  All other systems reviewed and are negative.    Physical Exam Updated Vital Signs BP (!) 125/56   Pulse 70   Temp 98.5 F (36.9 C) (Oral)   Resp (!) 29   SpO2 97%   Physical Exam  Constitutional: He appears well-developed and well-nourished. No distress.  HENT:  Head: Normocephalic and  atraumatic.  Mouth/Throat: Oropharynx is clear and moist. No oropharyngeal exudate.  Eyes: Conjunctivae are normal. Right eye exhibits no discharge. Left eye exhibits no discharge. No scleral icterus.  Glass eye on the right  Neck: Normal range of motion. Neck supple. No JVD present. No thyromegaly present.  Cardiovascular: Normal rate, regular rhythm, normal heart sounds and intact distal pulses. Exam reveals no gallop and no friction rub.  No murmur heard. Pulmonary/Chest: Effort normal and breath sounds normal. No respiratory distress. He has no wheezes. He has no rales.  Abdominal: Soft. Bowel sounds are normal. He exhibits no distension and no mass. There is tenderness ( Minimal mid abdominal tenderness,  no guarding).  Mild diffuse to phonetic sounds to percussion  Musculoskeletal: Normal range of motion. He exhibits no edema or tenderness.  Lymphadenopathy:    He has no cervical adenopathy.  Neurological: He is alert. Coordination normal.  The patient is able to hear me when I talk slow in bold, he is able to squeeze with both hands, normal grips, he is able to lift both legs at the same time, he seems to have normal strength in his lower extremities.  He is able to follow commands without difficulties, his speech is clear  Skin: Skin is warm and dry. No rash noted. No erythema.  Dermatitis around the ears and the mouth  Psychiatric: He has a normal mood and affect. His behavior is normal.  Nursing note and vitals reviewed.    ED Treatments / Results  Labs (all labs ordered are listed, but only abnormal results are displayed) Labs Reviewed - No data to display  EKG None  Radiology No results found.  Procedures .Critical Care Performed by: Noemi Chapel, MD Authorized by: Noemi Chapel, MD   Critical care provider statement:    Critical care time (minutes):  35   Critical care time was exclusive of:  Separately billable procedures and treating other patients and teaching  time   Critical care was necessary to treat or prevent imminent or life-threatening deterioration of the following conditions:  Cardiac failure   Critical care was time spent personally by me on the following activities:  Blood draw for specimens, development of treatment plan with patient or surrogate, discussions with consultants, evaluation of patient's response to treatment, examination of patient, obtaining history from patient or surrogate, ordering and performing treatments and interventions, ordering and review of laboratory studies, ordering and review of radiographic studies, pulse oximetry, re-evaluation of patient's condition and review of old charts   (including critical care time)  Medications Ordered in ED Medications - No data to display   Initial Impression / Assessment and Plan / ED Course  I have reviewed the triage vital signs and the nursing notes.  Pertinent labs & imaging results that were available during my care of the patient were reviewed by me and considered in my medical decision making (see chart for details).    The etiology of the patient's weakness likely correlates with his age however there are other possible etiologies including progressive renal failure, urinary tract infection, electro light abnormalities, malnourishment, the family is seeming to hinge their decision on what to do based on what an MRI may or may not show, essentially this MRI will need to be done prior to disposition as the family is insistent.  Unfortunately the MRI cannot be done as the patient has incompatible leads, this was discussed with Dr. Crissie Sickles  The patient does have an elevated troponin consistent with some type of cardiac stress and ischemia, he has known chronically obstructed right coronary artery and a chronic 90% occlusion in the left anterior descending both of which have been deemed medically managed by cardiology.  These findings were discussed with the patient and  the family members.  I also discussed the consult with the hospitalist, Dr. Blaine Hamper who will come to admit the patient to the hospital.  Aspirin given  Final Clinical Impressions(s) / ED Diagnoses   Final diagnoses:  NSTEMI (non-ST elevated myocardial infarction) [facility])  Renal insufficiency      Noemi Chapel, MD 02/16/18 2004

## 2018-02-16 NOTE — Progress Notes (Signed)
Patients pacemaker is conditional however the 1688 leads are not labeled as conditional.  Per policy we do not scan these.

## 2018-02-16 NOTE — ED Notes (Signed)
Admitting Provider at bedside. 

## 2018-02-16 NOTE — ED Notes (Signed)
Nurse drawing labs. 

## 2018-02-16 NOTE — H&P (Addendum)
History and Physical    [name] EXB:[id number] DOB: 1921-11-25 DOA: 02/16/2018  Referring MD/NP/PA:   PCP: [name], MD   Patient coming from:  The patient is coming from home.  At baseline, pt is partially dependent for most of ADL.  Chief Complaint: fall and dizziness  HPI: [name] is a 84 y.o. male with medical history significant of hypertension, hyperlipidemia, diet-controlled diabetes mellitus, hypothyroidism, anxiety, A. fib on Eliquis, CAD, stent placement, complete heart block, pacemaker placement, hard of hearing, CKD- 4, who presents with fall and dizziness.  Per his daughter and son, patient has been having dizziness for more than 1 month.  He has had multiple falls recently, last fall was yesterday, no significant injury.  No head or neck injury particularly.  Patient does not have headache or neck pain.  His daughter states that the patient has bilateral leg weakness, and a possible episode of a slurred speech on Thursday, which has completely resolved.  No facial droop or slurred speech.  No unilateral tingling in extremities. Daughter states that the patient has had multiple CT scans and evaluations in the past without definitive diagnosis. They think pt needs MRI-brain to r/o stroke.  Patient has been having mild shortness of breath chronically, but denies chest pain, cough, fever or chills.  No nausea, vomiting, diarrhea or abdominal pain. ED RN noted some penile bleeding, which is from small skin penile skin tear, not from meatus.  ED Course: pt was found to have troponin 0.18, WBC 7.8, slightly worsening renal function, temperature normal, no tachycardia, slightly tachypnea, oxygen saturation 95% on room air, chest x-ray showed cardiomegaly without infiltration.  Patient is placed on telemetry bed for observation  Review of Systems:   General: no fevers, chills, no body weight gain, has poor appetite, has fatigue HEENT: no blurry vision, or sore throat.  Has right eye blindness Respiratory: has dyspnea, no coughing, wheezing CV: no chest pain, no palpitations GI: no nausea, vomiting, abdominal pain, diarrhea, constipation GU: no dysuria, burning on urination, increased urinary frequency, hematuria  Ext: no leg edema Neuro: no unilateral weakness, numbness, or tingling, no vision change. Has hearing loss, fall and dizziness Skin: no rash. pt has minor bleeding from minor penile skin tear, not from meatus.  MSK: No muscle spasm, no deformity, no limitation of range of movement in spin Heme: No easy bruising.  Travel history: No recent long distant travel.  Allergy: No Known Allergies  Past Medical History:  Diagnosis Date  . Blindness of right eye    Related to trauma in [place]  . CAD (coronary artery disease)    (non Q wave MI in Jan 2009. He had chronically ocluded right coronary artery. He had a LAD w/90% stenosis at the take off of the first diagona. He had a drug-eluding stent palced by Dr Burt Knack)  . Cancer [facility])    prostate ca with radiation in 2005  . Cellulitis of face feb 2011   prostetic rt eye, cellulitis of rt side of face  . Complete heart block (HCC)    a. s/p STJ dual chamber PPM   . Diabetes mellitus without complication ([place])   . Dyslipidemia   . Hypertension   . Hypothyroid   . Macular degeneration   . Permanent atrial fibrillation [facility])     Past Surgical History:  Procedure Laterality Date  . eye removed Right 1997  . PPM GENERATOR CHANGEOUT N/A 10/04/2017   Procedure: PPM GENERATOR CHANGEOUT;  Surgeon:  Deboraha Sprang, MD;  Location: [facility] CV LAB;  Service: Cardiovascular;  Laterality: N/A;    Social History:  reports that he quit smoking about 17 years ago. His smoking use included cigarettes. He has a 130.00 pack-year smoking history. He has never used smokeless tobacco. He reports that he drinks about 1.0 standard drinks of alcohol per week. He reports that he does not use drugs.  Family History:    Family History  Problem Relation Age of Onset  . Heart disease Father      Prior to Admission medications   Medication Sig Start Date End Date Taking? Authorizing Provider  ALPRAZolam Duanne Moron) 0.5 MG tablet Take 1 tablet (0.5 mg total) by mouth at bedtime. for sleep 11/29/17   [name], MD  apixaban (ELIQUIS) 2.5 MG TABS tablet Take 1 tablet (2.5 mg total) by mouth 2 (two) times daily. 07/24/16   [name], MD  Carboxymethylcellulose Sodium (THERATEARS OP) Place 1 drop into both eyes daily as needed (for dry eyes).    [provider]  Cholecalciferol (VITAMIN D3) 5000 units CAPS Take 5,000 Units by mouth daily.    [provider]  docusate sodium (COLACE) 100 MG capsule Take 100 mg by mouth daily.    [provider]  glucose blood test strip Test BS BID and PRN. Dx e11.9 04/27/16   [name], MD  ketoconazole (NIZORAL) 2 % shampoo Apply 1 application topically 3 (three) times a week. 06/20/17   [provider]  levothyroxine (SYNTHROID, LEVOTHROID) 175 MCG tablet TAKE (1) TABLET DAILY BE- FORE BREAKFAST. 11/28/17   [name], MD  mirabegron ER (MYRBETRIQ) 25 MG TB24 tablet Take 25 mg by mouth 2 (two) times daily.     [provider]  Multiple Vitamins-Minerals (MULTIVITAMIN PO) Take 1 tablet by mouth daily.    [provider]  Multiple Vitamins-Minerals (ZINC PO) Take 1 tablet by mouth every other day.    [provider]  nitroGLYCERIN (NITROSTAT) 0.4 MG SL tablet Place 1 tablet (0.4 mg total) under the tongue every 5 (five) minutes as needed for chest pain (Do not exceed 3 doses). 09/10/16   [name], MD  Potassium 99 MG TABS Take 99 mg by mouth daily.     [provider]    Physical Exam: Vitals:   02/16/18 2030 02/16/18 2045 02/16/18 2100 02/16/18 2115  BP: 138/70 132/65 135/67 127/63  Pulse: 69 71 70 70  Resp: (!) 21 (!) 28 (!) 21 (!) 28  Temp:      TempSrc:      SpO2:  95% 95% 95% 93%   General: Not in acute distress HEENT:       Eyes: PERRL, EOMI, no scleral icterus. Has right eye blindness.       ENT: No discharge from the ears and nose, no pharynx injection, no tonsillar enlargement.        Neck: No JVD, no bruit, no mass felt. Heme: No neck lymph node enlargement. Cardiac: S1/S2, RRR, No murmurs, No gallops or rubs. Respiratory:  No rales, wheezing, rhonchi or rubs. GI: Soft, nondistended, nontender, no rebound pain, no organomegaly, BS present. GU: No hematuria Ext: No pitting leg edema bilaterally. 2+DP/PT pulse bilaterally. Musculoskeletal: No joint deformities, No joint redness or warmth, no limitation of ROM in spin. Skin: No rashes.  pt has minor bleeding from minor penile skin tear, not from meatus.  Neuro: Alert, oriented X3, cranial nerves II-XII grossly intact,  moves all extremities normally. Muscle strength 5/5 in all extremities, sensation to light touch intact. Brachial reflex 2+ bilaterally.  Negative Babinski's sign.  Psych: Patient is not psychotic, no suicidal or hemocidal ideation.  Labs on Admission: I have personally reviewed following labs and imaging studies  CBC: Recent Labs  Lab 02/16/18 1623  WBC 7.8  NEUTROABS 5.3  HGB 12.5*  HCT 40.3  MCV 90.2  PLT 962   Basic Metabolic Panel: Recent Labs  Lab 02/16/18 1623  NA 140  K 4.2  CL 105  CO2 21*  GLUCOSE 94  BUN 35*  CREATININE 1.58*  CALCIUM 9.2   GFR: Estimated Creatinine Clearance: 34 mL/min (A) (by C-G formula based on SCr of 1.58 mg/dL (H)). Liver Function Tests: Recent Labs  Lab 02/16/18 1623  AST 22  ALT 13  ALKPHOS 49  BILITOT 0.7  PROT 6.9  ALBUMIN 3.5   No results for input(s): LIPASE, AMYLASE in the last 168 hours. No results for input(s): AMMONIA in the last 168 hours. Coagulation Profile: No results for input(s): INR, PROTIME in the last 168 hours. Cardiac Enzymes: Recent Labs  Lab 02/16/18 1623  TROPONINI 0.18*   BNP (last 3  results) No results for input(s): PROBNP in the last 8760 hours. HbA1C: No results for input(s): HGBA1C in the last 72 hours. CBG: No results for input(s): GLUCAP in the last 168 hours. Lipid Profile: No results for input(s): CHOL, HDL, LDLCALC, TRIG, CHOLHDL, LDLDIRECT in the last 72 hours. Thyroid Function Tests: No results for input(s): TSH, T4TOTAL, FREET4, T3FREE, THYROIDAB in the last 72 hours. Anemia Panel: No results for input(s): VITAMINB12, FOLATE, FERRITIN, TIBC, IRON, RETICCTPCT in the last 72 hours. Urine analysis:    Component Value Date/Time   COLORURINE STRAW (A) 04/30/2017 1730   APPEARANCEUR Clear 05/27/2017 1233   LABSPEC 1.008 04/30/2017 1730   PHURINE 7.0 04/30/2017 1730   GLUCOSEU Negative 05/27/2017 1233   HGBUR NEGATIVE 04/30/2017 1730   BILIRUBINUR Negative 05/27/2017 1233   KETONESUR NEGATIVE 04/30/2017 1730   PROTEINUR Negative 05/27/2017 1233   PROTEINUR NEGATIVE 04/30/2017 1730   UROBILINOGEN 1.0 04/02/2015 1300   NITRITE Negative 05/27/2017 1233   NITRITE NEGATIVE 04/30/2017 1730   LEUKOCYTESUR Negative 05/27/2017 1233   Sepsis Labs: @LABRCNTIP (procalcitonin:4,lacticidven:4) )No results found for this or any previous visit (from the past 240 hour(s)).   Radiological Exams on Admission: Dg Chest Port 1 View  Result Date: 02/16/2018 CLINICAL DATA:  Altered mental status.  Dizziness.  Weakness. EXAM: PORTABLE CHEST 1 VIEW COMPARISON:  07/05/2017 FINDINGS: Stable mild cardiomegaly. Transvenous pacemaker remains in appropriate position. Aortic atherosclerosis. Both lungs are clear. IMPRESSION: Stable mild cardiomegaly.  No active lung disease. Electronically Signed   By: Earle Gell M.D.   On: 02/16/2018 16:24     EKG: Independently reviewed.   Seems to be paced rhythm?, QTC 507, low voltage, LAD, poor R wave progression, anteroseptal infarction pattern.  Assessment/Plan Principal Problem:   NSTEMI (non-ST elevated myocardial infarction)  ([place]) Active Problems:   Hyperlipidemia   Pacemaker-St. Jude   Atrial fibrillation-paroxysmal   Chronic kidney disease (CKD), stage IV (severe) (HCC)   CAD (coronary artery disease) of artery bypass graft   Hypothyroidism   Type II diabetes mellitus with renal manifestations (HCC)   Anxiety state   Fall   Dizziness   NSTEMI and hx of CAD: s/p of stent. Trop 0.18. No CP, but has SOB. If trop is trending up, may need to swtich Eliquis to IV heparin.  -  will place on Tele bed for obs - cycle CE q6 x3 and repeat EKG in the am  - prn Nitroglycerin, Morphine - start aspirin, lipitor 80 mg daily - Risk factor stratification: will check FLP and A1C  - 2d echo - inpt card consult was requested via Epic  Hyperlipidemia: not taking statin at home. -started lipitor -f/u FLp  Atrial fibrillation-paroxysmal: CHA2DS2-VASc Score is 5, needs oral anticoagulation. Patient is on Eliquis at home. Heart rate is well controlled. --continue Eliquis   Chronic kidney disease (CKD), stage IV (severe) ([place]): Slightly worsening.  Baseline creatinine 1.2-1.3.  BUN 35.  His creatinine is 1.58 (patient had a creatinine of 1.64 on 09/23/2017), -IV fluid: Patient received 500 cc normal saline in the ED, will continue 75 cc/h -f/u renal Fx by BMP  Hypothyroidism: Last TSH was 11.5 on 05/24/17 -Continue home Synthroid -Check TSH  Diet-controlled Type II diabetes mellitus with renal manifestations ([place]):  Last A1c 5.8 on 10/02/16, well controled. Patient is not taking meds at home. CBG 94. -check CBG qAM -f/u A1c  Anxiety state: -prn Xanax  Fall and Dizziness: Etiology is not clear.  Patient does not have focal neurologic findings on physical examination.  Daughter reported that the patient may have had episode of slurred speech on Thursday.  Will need to rule out stroke, but the patient cannot do MRI due to PPM. Per radiology, "Patients pacemaker is conditional however the 1688 leads are not labeled as  conditional.  Per policy we do not scan these". Due to CKD-IV, pt cannot do CTA of head or neck. -will get CT-head to r/o bleeding -carotid artery doppler and 2d echo -pt/ot -prn meclizine -Check orthostatic vital sign -IV fluid gently  Bleeding from penis: pt has minor bleeding fromminor penile skin tear, not from meatus. Hgb 12.5<---12.2 on 09/13/17, stable. -observe closely -wound cared consult   DVT ppx: on eliquis Code Status: full (I discussed with his son and daughter, his daughter is her power of attorney, but per family, they are not ready to make final decision, they need to discuss among family members, will temporarily put full code tonight). Family Communication:  Yes, patient's son and daughter at bed side Disposition Plan:  Anticipate discharge back to previous home environment Consults called:  none Admission status: Obs / tele     Date of Service 02/16/2018    Ivor Costa [facility] Pager [phone]  If 7PM-7AM, please contact night-coverage [email or web address] Password [facility] 02/16/2018, 9:34 PM

## 2018-02-16 NOTE — ED Notes (Signed)
[name] [phone] (daughter)

## 2018-02-16 NOTE — ED Notes (Signed)
RN and NT attempted condom catheter application but found laceration to foreskin when exposed. Laceration had minimal, active bleeding noted. Admitting MD notified of finding.

## 2018-02-16 NOTE — ED Triage Notes (Addendum)
Pt arrives from home with family for c.o. Months of dizziness, family states he has had multiple CT scans and was told he needs an MRI. Pt had his pacemaker changed into one that is suitable for MRI. Pt family is adamit that the pt not have "any useless tests" and that he has had them all and just needs his MRI done. Pt has had multiple falls, increasingly over the past 3 days and now has difficulty walking but has no pain anywhere. Pt is at his baseline, alert and oriented.

## 2018-02-16 NOTE — ED Notes (Signed)
MR informed RN that scan cannot be completed due to previous pacer wire incompatibility with scanning equipment. EDP notified.

## 2018-02-17 ENCOUNTER — Observation Stay (HOSPITAL_COMMUNITY): Payer: Medicare Other

## 2018-02-17 ENCOUNTER — Encounter (HOSPITAL_COMMUNITY): Payer: Self-pay | Admitting: Internal Medicine

## 2018-02-17 DIAGNOSIS — F411 Generalized anxiety disorder: Secondary | ICD-10-CM

## 2018-02-17 DIAGNOSIS — R531 Weakness: Secondary | ICD-10-CM | POA: Diagnosis not present

## 2018-02-17 DIAGNOSIS — E1122 Type 2 diabetes mellitus with diabetic chronic kidney disease: Secondary | ICD-10-CM | POA: Diagnosis present

## 2018-02-17 DIAGNOSIS — Z87891 Personal history of nicotine dependence: Secondary | ICD-10-CM | POA: Diagnosis not present

## 2018-02-17 DIAGNOSIS — Z9181 History of falling: Secondary | ICD-10-CM | POA: Diagnosis not present

## 2018-02-17 DIAGNOSIS — H3530 Unspecified macular degeneration: Secondary | ICD-10-CM | POA: Diagnosis present

## 2018-02-17 DIAGNOSIS — I361 Nonrheumatic tricuspid (valve) insufficiency: Secondary | ICD-10-CM | POA: Diagnosis not present

## 2018-02-17 DIAGNOSIS — R42 Dizziness and giddiness: Secondary | ICD-10-CM | POA: Diagnosis not present

## 2018-02-17 DIAGNOSIS — R7989 Other specified abnormal findings of blood chemistry: Secondary | ICD-10-CM

## 2018-02-17 DIAGNOSIS — R279 Unspecified lack of coordination: Secondary | ICD-10-CM | POA: Diagnosis not present

## 2018-02-17 DIAGNOSIS — Z955 Presence of coronary angioplasty implant and graft: Secondary | ICD-10-CM | POA: Diagnosis not present

## 2018-02-17 DIAGNOSIS — I2510 Atherosclerotic heart disease of native coronary artery without angina pectoris: Secondary | ICD-10-CM | POA: Diagnosis present

## 2018-02-17 DIAGNOSIS — N184 Chronic kidney disease, stage 4 (severe): Secondary | ICD-10-CM

## 2018-02-17 DIAGNOSIS — Z66 Do not resuscitate: Secondary | ICD-10-CM | POA: Diagnosis present

## 2018-02-17 DIAGNOSIS — Z743 Need for continuous supervision: Secondary | ICD-10-CM | POA: Diagnosis not present

## 2018-02-17 DIAGNOSIS — Z515 Encounter for palliative care: Secondary | ICD-10-CM | POA: Diagnosis not present

## 2018-02-17 DIAGNOSIS — Z79890 Hormone replacement therapy (postmenopausal): Secondary | ICD-10-CM | POA: Diagnosis not present

## 2018-02-17 DIAGNOSIS — R32 Unspecified urinary incontinence: Secondary | ICD-10-CM | POA: Diagnosis present

## 2018-02-17 DIAGNOSIS — R627 Adult failure to thrive: Secondary | ICD-10-CM | POA: Diagnosis present

## 2018-02-17 DIAGNOSIS — Z923 Personal history of irradiation: Secondary | ICD-10-CM | POA: Diagnosis not present

## 2018-02-17 DIAGNOSIS — I252 Old myocardial infarction: Secondary | ICD-10-CM | POA: Diagnosis not present

## 2018-02-17 DIAGNOSIS — I214 Non-ST elevation (NSTEMI) myocardial infarction: Principal | ICD-10-CM

## 2018-02-17 DIAGNOSIS — I130 Hypertensive heart and chronic kidney disease with heart failure and stage 1 through stage 4 chronic kidney disease, or unspecified chronic kidney disease: Secondary | ICD-10-CM | POA: Diagnosis present

## 2018-02-17 DIAGNOSIS — I482 Chronic atrial fibrillation: Secondary | ICD-10-CM

## 2018-02-17 DIAGNOSIS — Z7901 Long term (current) use of anticoagulants: Secondary | ICD-10-CM | POA: Diagnosis not present

## 2018-02-17 DIAGNOSIS — E785 Hyperlipidemia, unspecified: Secondary | ICD-10-CM | POA: Diagnosis present

## 2018-02-17 DIAGNOSIS — Z950 Presence of cardiac pacemaker: Secondary | ICD-10-CM

## 2018-02-17 DIAGNOSIS — R2689 Other abnormalities of gait and mobility: Secondary | ICD-10-CM | POA: Diagnosis not present

## 2018-02-17 DIAGNOSIS — Z23 Encounter for immunization: Secondary | ICD-10-CM | POA: Diagnosis not present

## 2018-02-17 DIAGNOSIS — R296 Repeated falls: Secondary | ICD-10-CM | POA: Diagnosis present

## 2018-02-17 DIAGNOSIS — F419 Anxiety disorder, unspecified: Secondary | ICD-10-CM | POA: Diagnosis not present

## 2018-02-17 DIAGNOSIS — I442 Atrioventricular block, complete: Secondary | ICD-10-CM | POA: Diagnosis not present

## 2018-02-17 DIAGNOSIS — E039 Hypothyroidism, unspecified: Secondary | ICD-10-CM | POA: Diagnosis present

## 2018-02-17 DIAGNOSIS — M6281 Muscle weakness (generalized): Secondary | ICD-10-CM | POA: Diagnosis not present

## 2018-02-17 DIAGNOSIS — R778 Other specified abnormalities of plasma proteins: Secondary | ICD-10-CM | POA: Diagnosis present

## 2018-02-17 DIAGNOSIS — L8990 Pressure ulcer of unspecified site, unspecified stage: Secondary | ICD-10-CM

## 2018-02-17 DIAGNOSIS — Z79899 Other long term (current) drug therapy: Secondary | ICD-10-CM | POA: Diagnosis not present

## 2018-02-17 DIAGNOSIS — Z7189 Other specified counseling: Secondary | ICD-10-CM | POA: Diagnosis not present

## 2018-02-17 DIAGNOSIS — Z8546 Personal history of malignant neoplasm of prostate: Secondary | ICD-10-CM | POA: Diagnosis not present

## 2018-02-17 DIAGNOSIS — H9190 Unspecified hearing loss, unspecified ear: Secondary | ICD-10-CM | POA: Diagnosis present

## 2018-02-17 DIAGNOSIS — I5020 Unspecified systolic (congestive) heart failure: Secondary | ICD-10-CM | POA: Diagnosis present

## 2018-02-17 DIAGNOSIS — I480 Paroxysmal atrial fibrillation: Secondary | ICD-10-CM | POA: Diagnosis present

## 2018-02-17 LAB — TROPONIN I
Troponin I: 0.14 ng/mL (ref <0.03)
Troponin I: 0.14 ng/mL (ref <0.03)

## 2018-02-17 LAB — LIPID PANEL
CHOLESTEROL: 184 mg/dL (ref 0–200)
HDL: 40 mg/dL — AB (ref >40)
LDL Cholesterol: 121 mg/dL — ABNORMAL HIGH (ref 0–99)
TRIGLYCERIDES: 114 mg/dL (ref <150)
Total CHOL/HDL Ratio: 4.6 RATIO
VLDL: 23 mg/dL (ref 0–40)

## 2018-02-17 LAB — BASIC METABOLIC PANEL
ANION GAP: 8 (ref 5–15)
BUN: 29 mg/dL — AB (ref 8–23)
CALCIUM: 8.7 mg/dL — AB (ref 8.9–10.3)
CO2: 21 mmol/L — ABNORMAL LOW (ref 22–32)
Chloride: 108 mmol/L (ref 98–111)
Creatinine, Ser: 1.4 mg/dL — ABNORMAL HIGH (ref 0.61–1.24)
GFR calc Af Amer: 48 mL/min — ABNORMAL LOW (ref >60)
GFR calc non Af Amer: 42 mL/min — ABNORMAL LOW (ref >60)
GLUCOSE: 118 mg/dL — AB (ref 70–99)
POTASSIUM: 4.4 mmol/L (ref 3.5–5.1)
SODIUM: 137 mmol/L (ref 135–145)

## 2018-02-17 LAB — TSH: TSH: 0.629 u[IU]/mL (ref 0.350–4.500)

## 2018-02-17 LAB — ECHOCARDIOGRAM COMPLETE: WEIGHTICAEL: 3104 [oz_av]

## 2018-02-17 MED ORDER — LORAZEPAM 2 MG/ML IJ SOLN
1.0000 mg | Freq: Once | INTRAMUSCULAR | Status: AC
  Administered 2018-02-17: 1 mg via INTRAVENOUS
  Filled 2018-02-17: qty 1

## 2018-02-17 MED ORDER — PERFLUTREN LIPID MICROSPHERE
1.0000 mL | INTRAVENOUS | Status: AC | PRN
  Administered 2018-02-17: 2 mL via INTRAVENOUS
  Filled 2018-02-17: qty 10

## 2018-02-17 NOTE — Progress Notes (Signed)
[facility] PROGRESS NOTE  STEEL KERNEY XFG:[id number] DOB: 04-06-1922 DOA: 02/16/2018 PCP: [name], MD  Assessment/Plan: NSTEMI and hx of CAD: s/p of stent. Hx CAD with chronically occluded right coronary artery per chart.  Trop 0.18>>>0.18>>0.14>>0.14. No CP. No events on tele. Repeat ekg reveals electronic ventricular pacemaker. - continue  prn Nitroglycerin, Morphine - continue aspirin, lipitor 80 mg daily -follow echo results -remains npo until cards sees  Hyperlipidemia: not taking statin at home. -started lipitor -f/u FLp  Atrial fibrillation-paroxysmal: CHA2DS2-VASc Score is 5, needs oral anticoagulation. Patient is on Eliquis at home. Heart rate is well controlled. --continue Eliquis   Chronic kidney disease (CKD), stage IV (severe) ([place]): Slightly worsening at time of admission.  Baseline creatinine 1.2-1.3.  BUN 35.  His creatinine was 1.58 on admission (patient had a creatinine of 1.64 on 09/23/2017), -IV fluid: Patient received 500 cc normal saline in the ED, will continue 75 cc/h -follow bmet -monitor urine output  Hypothyroidism: Last TSH was 11.5 on 05/24/17 -tsh 0.629 today -Continue home Synthroid -OP follow up  Diet-controlled Type II diabetes mellitus with renal manifestations ([place]):  Last A1c 5.8 on 10/02/16, well controled. Patient is not taking meds at home. CBG 94 on admission -check CBG qAM -f/u A1c  Anxiety state: -prn Xanax -appears anxious this am. Trying to get out of chair, hollering.   Fall and Dizziness: Etiology is not clear.  Patient does not have focal neurologic findings on physical examination. CT head without acute abnormality. the patient cannot do MRI due to PPM. Per radiology, "Patients pacemaker is conditional however the 1688 leads are not labeled as conditional. Per policy we do not scan these". Due to CKD-IV, pt cannot do CTA of head or neck. - follow carotid artery doppler and 2d echo -pt/ot -prn  meclizine -Check orthostatic vital sign -IV fluid gently  Bleeding from penis: pt has minor bleeding fromminor penile skin tear, not from meatus. Hgb 12.5<---12.2 on 09/13/17, stable. -no further bleeding -wound cared consult 1.   Code Status: full Family Communication:  None present Disposition Plan: placement   Consultants:  cardiology  Procedures:  none  Antibiotics:  none  HPI/Subjective: [name] is a 84 y.o. male with medical history significant of hypertension, hyperlipidemia, diet-controlled diabetes mellitus, hypothyroidism, anxiety, A. fib on Eliquis, CAD, stent placement, complete heart block, pacemaker placement, hard of hearing, CKD- 4, who presents with fall and dizziness. Initial evaluation revealed elevated troponin and acute on chronic renal failure.   Objective: Vitals:   02/17/18 0446 02/17/18 1001  BP: (!) 143/74 (!) 122/56  Pulse: 72 69  Resp: 20   Temp: 98.2 F (36.8 C)   SpO2: 95%     Intake/Output Summary (Last 24 hours) at 02/17/2018 1111 Last data filed at 02/17/2018 0700 Gross per 24 hour  Intake 358.31 ml  Output 900 ml  Net -541.69 ml   Filed Weights   02/16/18 2253 02/17/18 0446  Weight: 87.5 kg 88 kg    Exam:   General:  Sitting in recliner trying to get out  Cardiovascular: RRR no MGR trace LE edema  Respiratory: normal effort BS clear bilaterally no wheeze  Abdomen: round soft non-tender +BS no guarding  Musculoskeletal: joints without swelling/erythema    Data Reviewed: Basic Metabolic Panel: Recent Labs  Lab 02/16/18 1623  NA 140  K 4.2  CL 105  CO2 21*  GLUCOSE 94  BUN 35*  CREATININE 1.58*  CALCIUM 9.2   Liver Function Tests: Recent  Labs  Lab 02/16/18 1623  AST 22  ALT 13  ALKPHOS 49  BILITOT 0.7  PROT 6.9  ALBUMIN 3.5   No results for input(s): LIPASE, AMYLASE in the last 168 hours. No results for input(s): AMMONIA in the last 168 hours. CBC: Recent Labs  Lab 02/16/18 1623  WBC 7.8   NEUTROABS 5.3  HGB 12.5*  HCT 40.3  MCV 90.2  PLT 221   Cardiac Enzymes: Recent Labs  Lab 02/16/18 1623 02/16/18 2035 02/17/18 0302 02/17/18 0838  TROPONINI 0.18* 0.18* 0.14* 0.14*   BNP (last 3 results) No results for input(s): BNP in the last 8760 hours.  ProBNP (last 3 results) No results for input(s): PROBNP in the last 8760 hours.  CBG: No results for input(s): GLUCAP in the last 168 hours.  No results found for this or any previous visit (from the past 240 hour(s)).   Studies: Ct Head Wo Contrast  Result Date: 02/16/2018 CLINICAL DATA:  84 year old male with headache, dizziness and multiple falls. EXAM: CT HEAD WITHOUT CONTRAST TECHNIQUE: Contiguous axial images were obtained from the base of the skull through the vertex without intravenous contrast. COMPARISON:  07/05/2017 and prior CTs FINDINGS: Brain: No evidence of acute infarction, hemorrhage, hydrocephalus, extra-axial collection or mass lesion/mass effect. Moderate to severe atrophy and chronic small-vessel white matter ischemic changes again noted. Vascular: Atherosclerotic calcifications again noted. Skull: Normal. Negative for fracture or focal lesion. Sinuses/Orbits: No acute finding. Other: None. IMPRESSION: 1. No evidence of acute intracranial abnormality 2. Atrophy and chronic small-vessel white matter ischemic changes. Electronically Signed   By: Margarette Canada M.D.   On: 02/16/2018 21:45   Dg Chest Port 1 View  Result Date: 02/16/2018 CLINICAL DATA:  Altered mental status.  Dizziness.  Weakness. EXAM: PORTABLE CHEST 1 VIEW COMPARISON:  07/05/2017 FINDINGS: Stable mild cardiomegaly. Transvenous pacemaker remains in appropriate position. Aortic atherosclerosis. Both lungs are clear. IMPRESSION: Stable mild cardiomegaly.  No active lung disease. Electronically Signed   By: Earle Gell M.D.   On: 02/16/2018 16:24    Scheduled Meds: . apixaban  2.5 mg Oral BID  . aspirin  324 mg Oral Daily  . atorvastatin  80 mg  Oral q1800  . cholecalciferol  5,000 Units Oral Daily  . docusate sodium  100 mg Oral Daily  . levothyroxine  175 mcg Oral QAC breakfast  . mirabegron ER  25 mg Oral BID  . multivitamin   Oral Daily  . zinc sulfate  220 mg Oral QODAY   Continuous Infusions: . sodium chloride 10 mL/hr at 02/17/18 1111    Principal Problem:   NSTEMI (non-ST elevated myocardial infarction) [facility]) Active Problems:   Atrial fibrillation-paroxysmal   Fall   Elevated troponin   Hyperlipidemia   Pacemaker-St. Jude   Chronic kidney disease (CKD), stage IV (severe) ([facility])   CAD (coronary artery disease) of artery bypass graft   Type II diabetes mellitus with renal manifestations ([facility])   Dizziness   Complete heart block ([facility])   Anxiety state   Hypothyroidism    Time spent: 65 minutes    [facility]  If 7PM-7AM, please contact night-coverage at [email or web address], password [facility] 02/17/2018, 11:11 AM  LOS: 0 days

## 2018-02-17 NOTE — Evaluation (Signed)
Physical Therapy Evaluation Patient Details Name: [name] MRN: [id number] DOB: 14-Jan-1922 Today's Date: 02/17/2018   History of Present Illness  84 y.o. male with medical history significant of hypertension, hyperlipidemia, diet-controlled diabetes mellitus, hypothyroidism, anxiety, A. fib on Eliquis, CAD, stent placement, complete heart block, pacemaker placement, hard of hearing, CKD- 4, who presents with fall and dizziness.  Clinical Impression  Pt presenting with decreased safety awareness, extremely HOH, and onset of dizziness/syncope with mobility limiting ability to participate in ambulation this date. Pt reports being indep with cane for ambulation PTA but now requires 2 people and a walker. At this time pt unsafe to return home alone and would require 24/7 assist. Pt to benefit from ST-SNF upon d/c to progress toward indep with mobility.    Follow Up Recommendations SNF;Supervision/Assistance - 24 hour    Equipment Recommendations  None recommended by PT(has RW and 4WW)    Recommendations for Other Services       Precautions / Restrictions Precautions Precautions: Fall Precaution Comments: extremely HOH Restrictions Weight Bearing Restrictions: No      Mobility  Bed Mobility Overal bed mobility: Needs Assistance Bed Mobility: Sit to Supine     Supine to sit: Min guard Sit to supine: Mod assist;+2 for safety/equipment   General bed mobility comments: pt received attempting to get back to bed from chair, pt able to lay self down with minA for LE management  Transfers Overall transfer level: Needs assistance Equipment used: Rolling walker (2 wheeled) Transfers: Sit to/from [facility] Sit to Stand: Mod assist;+2 physical assistance Stand pivot transfers: Mod assist;+2 physical assistance       General transfer comment: pt with significant fear of falling due to being dizzy, pt required max v/c's for safe sequence (screaming instruction in L  ear so he can hear)  Ambulation/Gait             General Gait Details: unable this date  Stairs            Wheelchair Mobility    Modified Rankin (Stroke Patients Only)       Balance Overall balance assessment: Needs assistance Sitting-balance support: No upper extremity supported;Feet supported Sitting balance-Leahy Scale: Fair     Standing balance support: Bilateral upper extremity supported;During functional activity Standing balance-Leahy Scale: Poor Standing balance comment: Reliant on UE support                             Pertinent Vitals/Pain Pain Assessment: No/denies pain    Home Living Family/patient expects to be discharged to:: Private residence Living Arrangements: Alone Available Help at Discharge: Family;Available PRN/intermittently;Personal care attendant(Son) Type of Home: House Home Access: Level entry     Home Layout: One level Home Equipment: Walker - 2 wheels;Cane - single point;Wheelchair - [employer] - 4 wheels      Prior Function Level of Independence: Needs assistance   Gait / Transfers Assistance Needed: Used walker for functional mobility  ADL's / Homemaking Assistance Needed: Aides assisted with bathing and dressing. Family and aides performed IADLs  Comments: has [place] [facility] 8hr/day M-F, family on weekend     Hand Dominance        Extremity/Trunk Assessment   Upper Extremity Assessment Upper Extremity Assessment: Generalized weakness    Lower Extremity Assessment Lower Extremity Assessment: Generalized weakness    Cervical / Trunk Assessment Cervical / Trunk Assessment: Kyphotic  Communication   Communication: HOH  Cognition Arousal/Alertness: Awake/alert  Behavior During Therapy: WFL for tasks assessed/performed Overall Cognitive Status: Impaired/Different from baseline Area of Impairment: Safety/judgement;Memory                     Memory: Decreased short-term memory    Safety/Judgement: Decreased awareness of safety     General Comments: pt received trying to get out of chair with legrest elevated and trying to get into bed with bed rails up. pt aware of dizziness and inability to ambulate.       General Comments General comments (skin integrity, edema, etc.): VSS    Exercises     Assessment/Plan    PT Assessment Patient needs continued PT services  PT Problem List Decreased strength;Decreased range of motion;Decreased activity tolerance;Decreased balance;Decreased mobility;Decreased coordination;Decreased cognition;Decreased knowledge of use of DME;Decreased safety awareness       PT Treatment Interventions DME instruction;Gait training;Functional mobility training;Therapeutic activities;Therapeutic exercise;Balance training;Neuromuscular re-education;Cognitive remediation;Patient/family education    PT Goals (Current goals can be found in the Care Plan section)  Acute Rehab PT Goals Patient Stated Goal: "get better" PT Goal Formulation: With patient Time For Goal Achievement: 03/03/18 Potential to Achieve Goals: Good    Frequency Min 3X/week   Barriers to discharge Decreased caregiver support      Co-evaluation               AM-PAC PT "6 Clicks" Daily Activity  Outcome Measure Difficulty turning over in bed (including adjusting bedclothes, sheets and blankets)?: Unable Difficulty moving from lying on back to sitting on the side of the bed? : Unable Difficulty sitting down on and standing up from a chair with arms (e.g., wheelchair, bedside commode, etc,.)?: Unable Help needed moving to and from a bed to chair (including a wheelchair)?: A Lot Help needed walking in hospital room?: A Lot Help needed climbing 3-5 steps with a railing? : Total 6 Click Score: 8    End of Session Equipment Utilized During Treatment: Gait belt Activity Tolerance: (limited by dizziness) Patient left: in bed;with call bell/phone within reach;with  bed alarm set Nurse Communication: Mobility status PT Visit Diagnosis: Unsteadiness on feet (R26.81);Repeated falls (R29.6)    Time: 6147-0929 PT Time Calculation (min) (ACUTE ONLY): 14 min   Charges:   PT Evaluation $PT Eval Moderate Complexity: 1 Mod          Kittie Plater, PT, DPT Acute Rehabilitation Services Pager #: [phone] Office #: [phone]   Berline Lopes 02/17/2018, 10:52 AM

## 2018-02-17 NOTE — Progress Notes (Signed)
Patient noted to have visual hallucinations, seeing uniformed men outside the window or seeing people in the room or flowers by the wall. Son is at bedside and he said this is not his baseline, he is not doing this at home.

## 2018-02-17 NOTE — Consult Note (Addendum)
[facility] Nurse wound consult note Reason for Consult: Consult requested to assess penis.  Pt was noted to have a skin tear to this location after a condom cath was previously applied, according to progress notes. Wound type: partial thickness fissure to tip of penis; approx .5X.5X.1cm, pink and moist.  Penis is very retracted and he is no longer wearing a condom cath.   Pressure Injury: This is NOT a pressure injury Drainage (amount, consistency, odor)  Periwound: no odor or drainage Dressing procedure/placement/frequency: Pt is frequently incontinent of urine and he has requested to wear incontinent pads and briefs.  No further topical treatment is indicated at this time; leaving condom cath off is the best option to promote healing to the affected area. No family present to discuss plan of care. Please re-consult if further assistance is needed.  Thank-you,  Julien Girt MSN, [place], [place], [place], [place]

## 2018-02-17 NOTE — Progress Notes (Deleted)
The patient was seen, examined and discussed with Carlos Drown, NP  and I agree with the above.   A very pleasant 11-yo M with prior medical history of complete heart block, status post pacemaker placement, recent generator change by Dr. Caryl Comes, chronic atrial fibrillation on low-dose Eliquis, who [name] from home after sustaining several falls with associated dizziness (chronic issue) over the last several days.  The patient lives alone but has family nearby that helps.  The patient states that recently he has been having poor appetite and does not eat or drink fluids on a regular basis.  He is experiencing progressive weakness, dizziness, with multiple falls as stated about.  He denies any chest pain, and has dyspnea only when walking longer distances.  On physical exam he appears rather thin, he has no JVDs, regular rate and rhythm, his lungs are clear, and he has no lower extremity edema.  He is extremely hard of hearing. His labs show creatinine of 1.58 that has improved to 1.4.  Elevated troponin with flat trend 0.18, 0.14, 0.14.  Hemoglobin 12.5, WBC 7.8. There is no echocardiogram in the last 10 years.  His EKG shows paced rhythm. He is not on any blood pressure medication.  Assessment and plan: Elevated troponin: In the settings of chronic kidney disease, the patient does not have any chest pain or shortness of breath, no signs of heart failure, he would be a poor Candidate anyway given underlying CKD stage III and advanced age. Dizziness and falls, possibly related to poor balance and poor oral intake and advanced age, a facility such as nursing home should be considered for this patient as he lives at home alone. He appears euvolemic, we will await an echocardiogram however we are not planning for any further ischemic work-up.  Ena Dawley, MD 02/17/2018

## 2018-02-17 NOTE — Progress Notes (Signed)
Patient wants to use depends from home, refused diapers to be taken off.

## 2018-02-17 NOTE — Progress Notes (Signed)
VASCULAR LAB PRELIMINARY  PRELIMINARY  PRELIMINARY  PRELIMINARY  Bilateral lower extremity venous duplex completed.    Preliminary report:  There is no DVT or SVT noted in the bilateral lower extremities.   Mischele Detter, RVT 02/17/2018, 1:55 PM

## 2018-02-17 NOTE — Evaluation (Signed)
Occupational Therapy Evaluation Patient Details Name: [name] MRN: [id number] DOB: 02-05-1922 Today's Date: 02/17/2018    History of Present Illness 84 y.o. male with medical history significant of hypertension, hyperlipidemia, diet-controlled diabetes mellitus, hypothyroidism, anxiety, A. fib on Eliquis, CAD, stent placement, complete heart block, pacemaker placement, hard of hearing, CKD- 4, who presents with fall and dizziness.   Clinical Impression   PTA, pt reoprts he was living alone and would have aides present during the day to assists with BADLs and IADLs. Pt reports he used a rollator for functional mobility. Pt presenting with decreased safety awareness, strength, balance, and activity tolerance. Pt requiring Mod A for functional transfer with RW and Max cues for sequencing. Pt limited by [facility] and vision deficits. Pt reporting dizziness throughout session. Pt would benefit from further acute OT to facilitate safe dc. Recommend dc to SNF for further OT to optimize safety, independence with ADLs, and return to PLOF.      Follow Up Recommendations  SNF;Supervision/Assistance - 24 hour    Equipment Recommendations  None recommended by OT    Recommendations for Other Services PT consult     Precautions / Restrictions Precautions Precautions: Fall      Mobility Bed Mobility Overal bed mobility: Needs Assistance Bed Mobility: Supine to Sit     Supine to sit: Min guard     General bed mobility comments: Min Guard A for safety and cues for sequencing.   Transfers Overall transfer level: Needs assistance Equipment used: Rolling walker (2 wheeled) Transfers: Sit to/from [facility] Sit to Stand: Mod assist Stand pivot transfers: Mod assist       General transfer comment: Mod A for power up into standing. Mod A for balance and safety during pivot to recliner    Balance Overall balance assessment: Needs assistance Sitting-balance support: No upper  extremity supported;Feet supported Sitting balance-Leahy Scale: Fair     Standing balance support: Bilateral upper extremity supported;During functional activity Standing balance-Leahy Scale: Poor Standing balance comment: Reliant on UE support                           ADL either performed or assessed with clinical judgement   ADL Overall ADL's : Needs assistance/impaired Eating/Feeding: Set up;Supervision/ safety;Sitting   Grooming: Set up;Supervision/safety;Sitting Grooming Details (indicate cue type and reason): Cues for vision deficits Upper Body Bathing: Minimal assistance;Sitting   Lower Body Bathing: Maximal assistance;Sit to/from stand   Upper Body Dressing : Minimal assistance;Sitting   Lower Body Dressing: Maximal assistance;Sit to/from stand Lower Body Dressing Details (indicate cue type and reason): Max A to don socks whileseated at EOB             Functional mobility during ADLs: Moderate assistance;Rolling walker;Cueing for sequencing;Cueing for safety(Max cues) General ADL Comments: Pt requiring Max A for LB ADLs and Min A for UB ADLs. Pt with decreased safety awareness, strength, and balance.      Vision Baseline Vision/History: Legally blind;Macular Degeneration(Glass eye right; MD left) Patient Visual Report: No change from baseline       Perception     Praxis      Pertinent Vitals/Pain Pain Assessment: No/denies pain     Hand Dominance     Extremity/Trunk Assessment Upper Extremity Assessment Upper Extremity Assessment: Generalized weakness   Lower Extremity Assessment Lower Extremity Assessment: Defer to PT evaluation   Cervical / Trunk Assessment Cervical / Trunk Assessment: Kyphotic   Communication Communication Communication:  HOH   Cognition Arousal/Alertness: Awake/alert Behavior During Therapy: WFL for tasks assessed/performed Overall Cognitive Status: Impaired/Different from baseline Area of Impairment:  Safety/judgement;Memory                     Memory: Decreased short-term memory   Safety/Judgement: Decreased awareness of safety     General Comments: ST memory deficits and poor safety awareness. Feel pt is close to baseline cognition   General Comments  VSS    Exercises     Shoulder Instructions      Home Living Family/patient expects to be discharged to:: Private residence Living Arrangements: Alone Available Help at Discharge: Family;Available PRN/intermittently;Personal care attendant(Son) Type of Home: House Home Access: Level entry     Home Layout: One level               Home Equipment: Walker - 2 wheels;Cane - single point;Wheelchair - manual          Prior Functioning/Environment Level of Independence: Needs assistance  Gait / Transfers Assistance Needed: Used walker for functional mobility ADL's / Homemaking Assistance Needed: Aides assisted with bathing and dressing. Family and aides performed IADLs   Comments: Aides present during the day and leave when he goes to bed. Son checks in each day        OT Problem List: Decreased strength;Decreased range of motion;Decreased activity tolerance;Impaired balance (sitting and/or standing);Decreased cognition;Decreased safety awareness;Decreased knowledge of use of DME or AE;Decreased knowledge of precautions;Pain      OT Treatment/Interventions: Self-care/ADL training;Therapeutic exercise;Energy conservation;DME and/or AE instruction;Therapeutic activities;Patient/family education    OT Goals(Current goals can be found in the care plan section) Acute Rehab OT Goals Patient Stated Goal: "get better" OT Goal Formulation: With patient Time For Goal Achievement: 03/03/18 Potential to Achieve Goals: Good  OT Frequency: Min 2X/week   Barriers to D/C:            Co-evaluation              AM-PAC PT "6 Clicks" Daily Activity     Outcome Measure Help from another person eating meals?:  None Help from another person taking care of personal grooming?: A Little Help from another person toileting, which includes using toliet, bedpan, or urinal?: A Lot Help from another person bathing (including washing, rinsing, drying)?: A Lot Help from another person to put on and taking off regular upper body clothing?: A Little Help from another person to put on and taking off regular lower body clothing?: A Lot 6 Click Score: 16   End of Session Equipment Utilized During Treatment: Gait belt;Rolling walker Nurse Communication: Mobility status  Activity Tolerance: Patient limited by pain;Patient limited by fatigue Patient left: in chair;with call bell/phone within reach;with chair alarm set  OT Visit Diagnosis: Unsteadiness on feet (R26.81);Other abnormalities of gait and mobility (R26.89);Muscle weakness (generalized) (M62.81);Other symptoms and signs involving cognitive function;Pain Pain - part of body: (Generalized)                Time: 2778-2423 OT Time Calculation (min): 20 min Charges:  OT General Charges $OT Visit: 1 Visit OT Evaluation $OT Eval Moderate Complexity: [place], OTR/L Acute Rehab Pager: [phone] Office: [place] 02/17/2018, 9:23 AM

## 2018-02-17 NOTE — Progress Notes (Signed)
  Echocardiogram 2D Echocardiogram has been performed.  Kadon Andrus L Androw 02/17/2018, 2:16 PM

## 2018-02-17 NOTE — Clinical Social Work Note (Signed)
CSW acknowledges SNF consult. Patient currently observation status. He would have to meet medical criteria to be switched to inpatient status and be in the hospital for 3 nights under inpatient status for Medicare to cover SNF placement.  [name], [place]

## 2018-02-17 NOTE — Consult Note (Addendum)
Cardiology Consultation:   [name] ID: Carlos [name]; [id number]; 1922/02/16   Admit date: 02/16/2018 Date of Consult: 02/17/2018  Primary Care Provider: [name], MD Primary Cardiologist: Dr. Virl [name]  [name] Profile:   Carlos [name] is a 84 y.o. male with a hx of permanent atrial fibrillation on low dose Eliquis, complete heart block s/p PPM (2011 with generator change out 09/2017), CAD s/p MI with DES to LAD in 2009 and known chronically occluded RCA, negative adenosine stress 2013, DM2, HLD, CKD-Stage IV and HTN who is being seen today for Carlos evaluation of elevated troponin at Carlos request of Dr. Blaine [name].  History of Present Illness:   Carlos [name] is a 84yo M who presented to Carlos [name] on 02/16/18 from home after sustaining several falls with associated dizziness (chronic issue) over Carlos last several days. History difficult to obtain secondary to extreme [name]. No family currently at bedside for assistance. Per chart review, family stated that over Carlos last month, Carlos [name] has had increasing episodes of falls with progressive weakness, dizziness and decreased appetite. His dizziness is an ongoing, chronic concern without clear etiology for which he has been followed. His last fall was one day prior to admission on 02/15/18 with no acute injury. He lives at home alone, however receives nursing and nursing assistance care throughout Carlos day five days per week. During our interview, he denies chest pain or associated symptoms. He states that with his prior MI in 2009, he experienced chest tightness and SOB which he has not currently been experiencing. Per chart review, he has had a decrease in his appetite and Carlos pt states that he survives on "oatmeal cookies and ensure" mainly because of his loss of taste. He states that he he has had some difficulty with dyspnea, but only when walking outside with his walker. He denies recent illness or cough, LE swelling, palpitations, fatigue or syncope.     In Carlos ED, he was found to have an elevated troponin at 0.18 without chest pain. CXR revealed cardiomegaly without infiltration. Head CT with no evidence of acute intracranial abnormality. EKG showed ventricular pacing with no evidence of acute ischemic changes.  Of note, he was last seen in Carlos office on 02/05/18 for PPM gent change follow up. He was doing relatively well during that time, at his baseline with no complaints of anginal pains or associated symptoms. His largest complaint seemed to be his incontinence in which he follows with urology.   Cariology has been asked to consult given Carlos pt hx of CAD and elevated troponin.   Past Medical History:  Diagnosis Date  . Blindness of right eye    Related to trauma in [place]  . CAD (coronary artery disease)    (non Q wave MI in Jan 2009. He had chronically ocluded right coronary artery. He had a LAD w/90% stenosis at Carlos take off of Carlos first diagona. He had a drug-eluding stent palced by Dr Carlos [name])  . Cancer Carlos [name])    prostate ca with radiation in 2005  . Cellulitis of face feb 2011   prostetic rt eye, cellulitis of rt side of face  . Complete heart block (HCC)    a. s/p STJ dual chamber PPM   . Diabetes mellitus without complication ([place])   . Dyslipidemia   . Hypertension   . Hypothyroid   . Macular degeneration   . Permanent atrial fibrillation [facility])     Past Surgical History:  Procedure Laterality Date  .  eye removed Right 1997  . PPM GENERATOR CHANGEOUT N/A 10/04/2017   Procedure: PPM GENERATOR CHANGEOUT;  Surgeon: Carlos Sprang, MD;  Location: [place] [facility];  Service: Cardiovascular;  Laterality: N/A;     Prior to Admission medications   Medication Sig Start Date End Date Taking? Authorizing Provider  ALPRAZolam Carlos [name]) 0.5 MG tablet Take 1 tablet (0.5 mg total) by mouth at bedtime. for sleep 11/29/17  Yes Carlos Euler, MD  apixaban (ELIQUIS) 2.5 MG TABS tablet Take 1 tablet (2.5 mg total) by mouth 2 (two)  times daily. 07/24/16  Yes Carlos Euler, MD  Carboxymethylcellulose Sodium (THERATEARS OP) Apply 1 drop to eye daily as needed (for dry eyes).    Yes [provider]  Cholecalciferol (VITAMIN D3) 5000 units CAPS Take 5,000 Units by mouth daily.   Yes [provider]  docusate sodium (COLACE) 100 MG capsule Take 100 mg by mouth daily.   Yes [provider]  ketoconazole (NIZORAL) 2 % shampoo Apply 1 application topically 3 (three) times a week. 06/20/17  Yes [provider]  levothyroxine (SYNTHROID, LEVOTHROID) 175 MCG tablet TAKE (1) TABLET DAILY BE- FORE BREAKFAST. [name] taking differently: Take 175 mcg by mouth daily before breakfast.  11/28/17  Yes Carlos Euler, MD  mirabegron ER (MYRBETRIQ) 25 MG TB24 tablet Take 25 mg by mouth 2 (two) times daily.    Yes [provider]  Multiple Vitamins-Minerals (MULTIVITAMIN PO) Take 1 tablet by mouth daily.   Yes [provider]  Multiple Vitamins-Minerals (ZINC PO) Take 1 tablet by mouth every other day.   Yes [provider]  nitroGLYCERIN (NITROSTAT) 0.4 MG SL tablet Place 1 tablet (0.4 mg total) under Carlos tongue every 5 (five) minutes as needed for chest pain (Do not exceed 3 doses). 09/10/16  Yes Carlos Euler, MD  Potassium 99 MG TABS Take 99 mg by mouth daily.    Yes [provider]  glucose blood test strip Test BS BID and PRN. Dx e11.9 04/27/16   Carlos Honour, MD    Inpatient Medications: Scheduled Meds: . apixaban  2.5 mg Oral BID  . aspirin  324 mg Oral Daily  . atorvastatin  80 mg Oral q1800  . cholecalciferol  5,000 Units Oral Daily  . docusate sodium  100 mg Oral Daily  . levothyroxine  175 mcg Oral QAC breakfast  . mirabegron ER  25 mg Oral BID  . multivitamin   Oral Daily  . zinc sulfate  220 mg Oral QODAY   Continuous Infusions: . sodium chloride 75 mL/hr at 02/17/18 0044   PRN Meds: acetaminophen, ALPRAZolam, hydrALAZINE, hydrOXYzine,  meclizine, morphine injection, nitroGLYCERIN, polyvinyl alcohol, zolpidem  Allergies:   No Known Allergies  Social History:   Social History   Socioeconomic History  . Marital status: Widowed    Spouse name: Not on file  . Number of children: Not on file  . Years of education: Not on file  . Highest education level: Not on file  Occupational History  . 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 Financial resource strain: Not on file  . Food insecurity:    Worry: Not on file    Inability: Not on file  . Transportation needs:    Medical: Not on file    Non-medical: Not on file  Tobacco Use  . Smoking status: Former Smoker    Packs/day: 2.00  Years: 65.00    Pack years: 130.00    Types: Cigarettes    Last attempt to quit: 09/11/2000    Years since quitting: 17.4  . Smokeless tobacco: Never Used  Substance and Sexual Activity  . Alcohol use: Yes    Alcohol/week: 1.0 standard drinks    Types: 1 Cans of beer per week    Comment: occ  . Drug use: No  . Sexual activity: Not on file  Lifestyle  . Physical activity:    Days per week: Not on file    Minutes per session: Not on file  . Stress: Not on file  Relationships  . Social connections:    Talks on phone: Not on file    Gets together: Not on file    Attends religious service: Not on file    Active member of club or organization: Not on file    Attends meetings of clubs or organizations: Not on file    Relationship status: Not on file  . Intimate partner violence:    Fear of current or ex partner: Not on file    Emotionally abused: Not on file    Physically abused: Not on file    Forced sexual activity: Not on file  Other Topics Concern  . Not on file  Social History Narrative   Married w/3 children, lives with wife.           Family History:   Family History  Problem Relation Age of Onset  . Heart disease Father    Family Status:  Family Status  Relation  Name Status  . Mother  Deceased  . Father  Deceased  . Sister  Deceased  . Brother  Deceased  . MGM  Deceased  . MGF  Deceased  . PGM  Deceased  . PGF  Deceased    ROS:  Please see Carlos history of present illness.  All other ROS reviewed and negative.     Physical Exam/Data:   Vitals:   02/16/18 2256 02/17/18 0020 02/17/18 0446 02/17/18 1001  BP: 114/63 (!) 133/55 (!) 143/74 (!) 122/56  Pulse: 70 70 72 69  Resp:  18 20   Temp:  99.5 F (37.5 C) 98.2 F (36.8 C)   TempSrc:  Oral Oral   SpO2:  98% 95%   Weight:   88 kg     Intake/Output Summary (Last 24 hours) at 02/17/2018 1031 Last data filed at 02/17/2018 0700 Gross per 24 hour  Intake 358.31 ml  Output 900 ml  Net -541.69 ml   Filed Weights   02/16/18 2253 02/17/18 0446  Weight: 87.5 kg 88 kg   Body mass index is 24.91 kg/m.   General: Elderly,  NAD Skin: Warm, dry, intact  Head: Normocephalic, atraumatic,  clear, moist mucus membranes. Neck: Negative for carotid bruits. No JVD Lungs:Clear to ausculation bilaterally. No wheezes, rales, or rhonchi. Breathing is unlabored. Cardiovascular: Irregularly irregular with S1 S2. No murmurs, rubs, gallops, or LV heave appreciated. Abdomen: Soft, non-tender, non-distended with normoactive bowel sounds. No obvious abdominal masses. MSK: Strength and tone appear normal for age. 5/5 in all extremities Extremities: No edema. No clubbing or cyanosis. DP/PT pulses 2+ bilaterally Neuro: Alert and oriented. No focal deficits. No facial asymmetry. MAE spontaneously. Extremely HOH  Psych: Responds to questions appropriately with normal affect.     EKG:  Carlos EKG was personally reviewed and demonstrates:  02/16/18 Ventricular pacing with no acute ischemic changes, similar to prior tracings  Telemetry:  Telemetry was personally reviewed and demonstrates: 02/17/18 atrial fibrillation with V-pacing   Relevant CV Studies:  ECHO: None   CATH: 07/04/2007: ASSESSMENT:  1. Severe  proximal left anterior descending stenosis.  2. Chronic right coronary artery occlusion, collateralized from Carlos      left.  3. Nonobstructive left circumflex stenosis.  4. Normal left ventricular function.  5. Successful percutaneous coronary intervention of Carlos proximal left      anterior descending with a Promus drug-eluting stent.   DISCUSSION:  Carlos [name] should be continued on standard post-PCI medical therapy, which will include aspirin and Plavix for 1 year.  I would  avoid all AV nodal blocking agents in Carlos setting of his heart block.  I  think we could watch his heart rhythm and see if he has any improvement in his heart rate.  He has remained hemodynamically stable throughout with a systolic blood pressure in Carlos 150 to 160 range and is otherwise tolerating his bradycardia reasonably well at present. He may ultimately require permanent pacemaker.  Laboratory Data:  Chemistry Recent Labs  Lab 02/16/18 1623  NA 140  K 4.2  CL 105  CO2 21*  GLUCOSE 94  BUN 35*  CREATININE 1.58*  CALCIUM 9.2  GFRNONAA 36*  GFRAA 42*  ANIONGAP 14    Total Protein  Date Value Ref Range Status  02/16/2018 6.9 6.5 - 8.1 g/dL Final  05/24/2017 6.9 6.0 - 8.5 g/dL Final   Albumin  Date Value Ref Range Status  02/16/2018 3.5 3.5 - 5.0 g/dL Final  05/24/2017 3.8 3.2 - 4.6 g/dL Final   AST  Date Value Ref Range Status  02/16/2018 22 15 - 41 U/L Final   ALT  Date Value Ref Range Status  02/16/2018 13 0 - 44 U/L Final   Alkaline Phosphatase  Date Value Ref Range Status  02/16/2018 49 38 - 126 U/L Final   Total Bilirubin  Date Value Ref Range Status  02/16/2018 0.7 0.3 - 1.2 mg/dL Final   Bilirubin Total  Date Value Ref Range Status  05/24/2017 0.3 0.0 - 1.2 mg/dL Final   Hematology Recent Labs  Lab 02/16/18 1623  WBC 7.8  RBC 4.47  HGB 12.5*  HCT 40.3  MCV 90.2  MCH 28.0  MCHC 31.0  RDW 14.0  PLT 221   Cardiac Enzymes Recent Labs  Lab 02/16/18 1623  02/16/18 2035 02/17/18 0302 02/17/18 0838  TROPONINI 0.18* 0.18* 0.14* 0.14*   No results for input(s): TROPIPOC in Carlos last 168 hours.  BNPNo results for input(s): BNP, PROBNP in Carlos last 168 hours.  DDimer No results for input(s): DDIMER in Carlos last 168 hours. TSH:  Lab Results  Component Value Date   TSH 0.629 02/17/2018   Lipids: Lab Results  Component Value Date   CHOL 184 02/17/2018   HDL 40 (L) 02/17/2018   LDLCALC 121 (H) 02/17/2018   TRIG 114 02/17/2018   CHOLHDL 4.6 02/17/2018   HgbA1c: Lab Results  Component Value Date   HGBA1C 5.8 10/02/2013    Radiology/Studies:  Ct Head Wo Contrast  Result Date: 02/16/2018 CLINICAL DATA:  84 year old male with headache, dizziness and multiple falls. EXAM: CT HEAD WITHOUT CONTRAST TECHNIQUE: Contiguous axial images were obtained from Carlos base of Carlos skull through Carlos vertex without intravenous contrast. COMPARISON:  07/05/2017 and prior CTs FINDINGS: Brain: No evidence of acute infarction, hemorrhage, hydrocephalus, extra-axial collection or mass lesion/mass effect. Moderate to severe atrophy and chronic small-vessel white matter ischemic changes again  noted. Vascular: Atherosclerotic calcifications again noted. Skull: Normal. Negative for fracture or focal lesion. Sinuses/Orbits: No acute finding. Other: None. IMPRESSION: 1. No evidence of acute intracranial abnormality 2. Atrophy and chronic small-vessel white matter ischemic changes. Electronically Signed   By: Margarette Canada M.D.   On: 02/16/2018 21:45   Dg Chest Port 1 View  Result Date: 02/16/2018 CLINICAL DATA:  Altered mental status.  Dizziness.  Weakness. EXAM: PORTABLE CHEST 1 VIEW COMPARISON:  07/05/2017 FINDINGS: Stable mild cardiomegaly. Transvenous pacemaker remains in appropriate position. Aortic atherosclerosis. Both lungs are clear. IMPRESSION: Stable mild cardiomegaly.  No active lung disease. Electronically Signed   By: Earle Gell M.D.   On: 02/16/2018 16:24     Assessment and Plan:   1. NSTEMI with hx of CAD s/p DES 2009: -Pt [facility] to [name] on 02[facility]16[facility]18 with no anginal symptoms, however with frequent falls, dizziness (chronic issue) and decreased appetite found to have an elevated troponin  -Trop, 0.18>0.18>0.14>0.14>>flat trend with no anginal symptoms  -EKG with no acute ischemic changes  -Echocardiogram with pending results>>no prior study available -Per cath report 2009, pt with known CTO of RCA with collaterals and negative stress test in 2013 -Given Carlos absence of anginal symptoms and flat trop trend, would suspect demand ischemia>>however, could consider Myoview stress test for definitive evaluation -Continue ASA, high-intensity statin -Consider BB in Carlos setting of know CAD   2. Hx of recent falls with associated dizziness: -Unclear etiology>>>pt reports that Carlos dizziness is a chronic issue for Carlos last 5 years without much resolution  -Per chart review, family reported slurred speech several days again which was noted to have resolved on its own>>appears to be resolved at this time>>>head CT negative for acute changes  -Primary team ordered carotid dopplers with echo with PT/OT evaluation  -PRN meclizine  -Orthostatics>>>lying-127/67, sitting-114/63 -Continue IV hydration>>>does not appear to be fluid volume overloaded on exam   3. S/p PPM secondary to CHB: -St. Jude PPM placed 2009>>generator change out 09/2017 -Stable   4. CKD Stage IV: -Creatinine, 1.58 today -Baseline appears to be in Carlos 1.3-1.5 range  -IV fluid hydration on arrival >>35ml/hr  -Continue to avoid nephrotoxic medications  5. DM2: -Last HbA1c, 5.78 10/02/13 -SSI for glucose control while inpatient -Obtain HbA1c while inpatient   6. HTN: -Stable, 122/56>143/74>133/55 -No antihypertensives   7. HLD: -Last lipid panel 02/17/18 with LDL at 121, not currently at goal -No on home regimen>>started on statin>>will need follow up liver panel as OP   8.  Permanent atrial fibrillation: -EKG with  -Continue Eliquis -CHA2DS2VASc =5 (age, HTN, DM2, vascular disease)  For questions or updates, please contact [facility] Please consult [email or web address] for contact info under Cardiology/STEMI.   SignedKathyrn Drown NP-C [facility] Pager: [phone] 02/17/2018 10:31 AM   Carlos [name] was seen, examined and discussed with Kathyrn Drown, NP  and I agree with Carlos above.   A very pleasant 89-yo M with prior medical history of complete heart block, status post pacemaker placement, recent generator change by Dr. Caryl Comes, chronic atrial fibrillation on low-dose Eliquis, who presented to [facility] on 02/16/18 from home after sustaining several falls with associated dizziness (chronic issue) over Carlos last several days.  Carlos [name] lives alone but has family nearby that helps.  Carlos [name] states that recently he has been having poor appetite and does not eat or drink fluids on a regular basis.  He is experiencing progressive weakness, dizziness, with multiple falls as stated about.  He denies any chest  pain, and has dyspnea only when walking longer distances.  On physical exam he appears rather thin, he has no JVDs, regular rate and rhythm, his lungs are clear, and he has no lower extremity edema.  He is extremely hard of hearing. His labs show creatinine of 1.58 that has improved to 1.4.  Elevated troponin with flat trend 0.18, 0.14, 0.14.  Hemoglobin 12.5, WBC 7.8. There is no echocardiogram in Carlos last 10 years.  His EKG shows paced rhythm. He is not on any blood pressure medication.  Assessment and plan: Elevated troponin: In Carlos settings of chronic kidney disease, Carlos [name] does not have any chest pain or shortness of breath, no signs of heart failure, he would be a poor Candidate anyway given underlying CKD stage III and advanced age. Dizziness and falls, possibly related to poor balance and poor oral intake and advanced age, a facility such as nursing  home should be considered for this [name] as he lives at home alone. He appears euvolemic, we will await an echocardiogram however we are not planning for any further ischemic work-up.  Ena Dawley, MD 02/17/2018

## 2018-02-18 ENCOUNTER — Encounter (HOSPITAL_COMMUNITY): Payer: Self-pay | Admitting: General Practice

## 2018-02-18 ENCOUNTER — Other Ambulatory Visit: Payer: Self-pay

## 2018-02-18 LAB — BASIC METABOLIC PANEL
ANION GAP: 10 (ref 5–15)
BUN: 30 mg/dL — ABNORMAL HIGH (ref 8–23)
CHLORIDE: 108 mmol/L (ref 98–111)
CO2: 21 mmol/L — ABNORMAL LOW (ref 22–32)
CREATININE: 1.46 mg/dL — AB (ref 0.61–1.24)
Calcium: 8.6 mg/dL — ABNORMAL LOW (ref 8.9–10.3)
GFR calc non Af Amer: 40 mL/min — ABNORMAL LOW (ref >60)
GFR, EST AFRICAN AMERICAN: 46 mL/min — AB (ref >60)
Glucose, Bld: 118 mg/dL — ABNORMAL HIGH (ref 70–99)
Potassium: 3.8 mmol/L (ref 3.5–5.1)
SODIUM: 139 mmol/L (ref 135–145)

## 2018-02-18 LAB — HEMOGLOBIN A1C
HEMOGLOBIN A1C: 6.1 % — AB (ref 4.8–5.6)
Mean Plasma Glucose: 128 mg/dL

## 2018-02-18 MED ORDER — INFLUENZA VAC SPLIT HIGH-DOSE 0.5 ML IM SUSY
0.5000 mL | PREFILLED_SYRINGE | INTRAMUSCULAR | Status: AC
  Administered 2018-02-19: 0.5 mL via INTRAMUSCULAR
  Filled 2018-02-18: qty 0.5

## 2018-02-18 NOTE — Progress Notes (Signed)
[facility] will sign off.   Medication Recommendations: medications as above Other recommendations (labs, testing, etc):  No ischemic testing required Follow up as an outpatient:  We will arrange  Ena Dawley, MD 02/18/2018

## 2018-02-18 NOTE — Clinical Social Work Note (Signed)
Clinical Social Work Assessment  Patient Details  Name: [name] MRN: [id number] Date of Birth: 01/03/1922  Date of referral:  02/18/18               Reason for consult:  Facility Placement, Discharge Planning                Permission sought to share information with:  Family Supports Permission granted to share information::  Yes, Verbal Permission Granted  Name::     Kayton Dunaj  Agency::     Relationship::  Son  Contact Information:  [phone]  Housing/Transportation Living arrangements for the past 2 months:  [place] of Information:  Patient, Medical Team, Adult Children Patient Interpreter Needed:  None Criminal Activity/Legal Involvement Pertinent to Current Situation/Hospitalization:  No - Comment as needed Significant Relationships:  Adult Children Lives with:  Self, Other (Comment)(Has five caregivers who are there 8 am-9 pm.) Do you feel safe going back to the place where you live?  Yes Need for family participation in patient care:  Yes (Comment)  Care giving concerns:  PT recommending SNF once medically stable for discharge.   Social Worker assessment / plan:  CSW met with patient. Son at bedside. CSW introduced role and explained that PT recommendations would be discussed. At first patient and son were interested in talking about SNF until they realized it was a "nursing home." Patient stated he does not want to go to a nursing home. Patient's son stated they have been doing all they can to keep him at home. Patient has five caregivers and they are scheduled from 8 am-9 pm. Patient is home alone during the other hours but they are looking into hiring someone to come in during those hours as well. Patient's son will discuss HHPT vs. SNF with his sister when she arrives today then let CSW know what they have decided. RNCM aware. No further concerns. CSW encouraged patient and his son to contact CSW as needed. CSW will continue to follow patient and  his children for support and facilitate discharge to SNF, if agreeable, once medically stable.  Employment status:  Retired Forensic scientist:  Medicare PT Recommendations:  [place] / Referral to community resources:  [place]  Patient/Family's Response to care:  Patient and his son prefer not to go to SNF. Patient's children supportive and involved in patient's care. Patient and his son appreciated social work intervention.  Patient/Family's Understanding of and Emotional Response to Diagnosis, Current Treatment, and Prognosis:  Patient and his son have a good understanding of the reason for admission and his need for continued therapy after discharge. Patient and his son appear happy with hospital care.  Emotional Assessment Appearance:  Appears stated age Attitude/Demeanor/Rapport:  Engaged, Gracious Affect (typically observed):  Appropriate, Calm, Pleasant Orientation:  Oriented to Self, Oriented to Place, Oriented to  Time, Oriented to Situation Alcohol / Substance use:  Never Used Psych involvement (Current and /or in the community):  No (Comment)  Discharge Needs  Concerns to be addressed:  Care Coordination Readmission within the last 30 days:  No Current discharge risk:  Dependent with Mobility Barriers to Discharge:  Continued Medical Work up, [employer] inpatient day 1/3.)   Candie Chroman, LCSW 02/18/2018, 12:36 PM

## 2018-02-18 NOTE — Progress Notes (Signed)
PROGRESS NOTE    [name]  MBT:[id number] DOB: 1921-11-09 DOA: 02/16/2018 PCP: Carlos Euler, MD (Confirm with patient/family/NH records and if not entered, this HAS to be entered at [facility] point of entry. "No PCP" if truly none.)   Brief Narrative: 84 year old male with history of hypertension, hyperlipidemia, diabetes type 2, hypothyroidism, anxiety, atrial fibrillation on Eliquis, coronary artery disease, stent placement, permanent pacemaker, chronic kidney disease, extremely hard of hearing and legally blind who presents with multiple falls and dizziness.  He is being treated for a non-ST elevation MI, paroxysmal atrial fibrillation, falls and dizziness.    Cardiology saw the patient and felt that he had demand ischemia.  I had an extensive discussion with the patient's daughter [name] and HER-2 brothers.  By telephone conference they expressed to me their sincere desire to keep the patient home as long as possible.  Given his significant debility and dizziness and falls physical therapy had been recommending skilled placement.  Family states they have 5 caregivers who provide 24/7 support.  They feel that keeping him at home is very important to maintain his quality of life.  They instructed me to place a DO NOT RESUSCITATE order.  And in discussions we talked about palliative care and hospice at home.  They are very interested in this as patient has been declining.  He has not been eating.  Is worsening chronic kidney disease.  He is having anxiety, and much confusion while in the hospital.  Patient's multiple medical problems I feel that he does have a 35-monthor less life expectancy.  Going to consult palliative medicine for assistance with symptom management prior to discharge home and for possible referral to hospice.   Assessment & Plan:   Principal Problem:   NSTEMI (non-ST elevated myocardial infarction) ([place] Active Problems:   Hyperlipidemia   Pacemaker-St. Jude   Atrial  fibrillation-paroxysmal   Chronic kidney disease (CKD), stage IV (severe) (HCC)   CAD (coronary artery disease) of artery bypass graft   Hypothyroidism   Type II diabetes mellitus with renal manifestations (HCC)   Anxiety state   Fall   Dizziness   Elevated troponin   Complete heart block (HCC)   Pressure injury of skin   NSTEMIand hx of CAD: s/p of stent. Hx CAD with chronically occluded right coronary artery per chart.  Trop 0.18>>>0.18>>0.14>>0.14. No CP. No events on tele. Repeat ekg reveals electronic ventricular pacemaker. - continue  prn Nitroglycerin, Morphine - continueaspirin, lipitor 80 mg daily -Echocardiogram shows diffuse hypokinesis with EF of 30 to 35% Event was thought to be a demand ischemia.  Hyperlipidemia:not taking statin at home. -started lipitor -f/u FLp  Atrial fibrillation-paroxysmal:CHA2DS2-VASc Scoreis 5, needs oral anticoagulation. Patient is on Eliquis at home. Heart rate is well controlled. --continue Eliquis  Chronic kidney disease (CKD), stage IV (severe) (HCC):Slightly worsening at time of admission.Baseline creatinine 1.2-1.3. BUN 35. His creatinine was 1.58 on admission (patient had a creatinine of 1.64 on 09/23/2017), -IV fluid: Patient received 500 cc normal saline in the ED, will discontinue fluids given ejection fraction. -Creatinine greater than 1.4 today -monitor urine output  Hypothyroidism: Last TSH was11.5on 05/24/17 -tsh 0.629 today -Continue home Synthroid -OP follow up  Diet-controlledType II diabetes mellitus with renal manifestations (HCC):Last A1c5.8 on 10/02/16, well controled. Patient isnottakingmedsat home. CBG 94 on admission -check CBG qAM -f/uA1c -Even patient's advanced age and poor p.o. intake at home would not recommend any medications at the time and would encourage pleasure feedings.   Anxiety  state with confusion: -prn Xanax -appears anxious this am. Trying to get out of chair,  hollering.   FallandDizziness:Etiology is not clear. Patient does not have focal neurologic findings on physical examination. CT head without acute abnormality. the patient cannot do MRI due toPPM.Per radiology, "Patients pacemaker is conditional however the 1688 leads are not labeled as conditional. Per policy we do not scan these". Due to CKD-IV, pt cannot do CTA of head or neck. -pt/ot continues while in hospital were arranged at home at discharge Advanced age will discontinue meclizine -IV fluid gently yesterday, discontinued today.  Bleeding from penis: pt has minor bleeding fromminor penile skin tear, not from meatus. Hgb 12.5<---12.2 on 09/13/17, stable. -no further bleeding -wound cared consult  End-of-life care: He is very interested in maintaining patient's quality of life at this point.  They are not interested in skilled facility placement as it would upset and confused the patient.  If his hospitalization it has anything to say for it he has been extremely confused while here.  They are correct in that he would get more confused out of facility.  Her goal is to try to bring him home with 24/7 care which I believe is the best option for this patient.  I believe that he would qualify for hospice care thus providing them additional support in the home.  The family understands what hospice care is as both the daughter's husband and the patient's wife were on hospice.  Family understands that goals will be to try to keep him at home and not to [facility] him.  He is DNR.  Appreciate palliative medicine consultation  Code Status: Resuscitate Family Communication:   Telephone conference to patient's daughter [name], and HER-2 brothers. Disposition Plan:  Caretakers and hospice   Consultants:  cardiology  Procedures:  none  Antibiotics:  none  Subjective: Confused and generally unhappy  Objective: Vitals:   02/17/18 1951 02/18/18 0452 02/18/18 0452 02/18/18 1218   BP: (!) 151/54  103/63 123/63  Pulse: 72  67 70  Resp: 18  16   Temp: 98.6 F (37 C)  98 F (36.7 C) 98.1 F (36.7 C)  TempSrc: Oral   Oral  SpO2: 92%  93% 97%  Weight:  87.2 kg      Intake/Output Summary (Last 24 hours) at 02/18/2018 1411 Last data filed at 02/18/2018 0617 Gross per 24 hour  Intake 0 ml  Output 500 ml  Net -500 ml   Filed Weights   02/16/18 2253 02/17/18 0446 02/18/18 0452  Weight: 87.5 kg 88 kg 87.2 kg    Examination:  General exam: Appears calm and comfortable  Respiratory system: Clear to auscultation. Respiratory effort normal. Cardiovascular system: S1 & S2 heard, RRR. No JVD, murmurs, rubs, gallops or clicks. No pedal edema. Gastrointestinal system: Abdomen is nondistended, soft and nontender. No organomegaly or masses felt. Normal bowel sounds heard. Central nervous system: Alert and oriented. No focal neurological deficits. Extremities: Symmetric 5 x 5 power. Skin: No rashes, lesions or ulcers Psychiatry: Judgement and insight appear normal. Mood & affect appropriate.     Data Reviewed: I have personally reviewed following labs and imaging studies  CBC: Recent Labs  Lab 02/16/18 1623  WBC 7.8  NEUTROABS 5.3  HGB 12.5*  HCT 40.3  MCV 90.2  PLT 665   Basic Metabolic Panel: Recent Labs  Lab 02/16/18 1623 02/17/18 1151 02/18/18 0817  NA 140 137 139  K 4.2 4.4 3.8  CL 105 108 108  CO2  21* 21* 21*  GLUCOSE 94 118* 118*  BUN 35* 29* 30*  CREATININE 1.58* 1.40* 1.46*  CALCIUM 9.2 8.7* 8.6*   GFR: Estimated Creatinine Clearance: 36.8 mL/min (A) (by C-G formula based on SCr of 1.46 mg/dL (H)). Liver Function Tests: Recent Labs  Lab 02/16/18 1623  AST 22  ALT 13  ALKPHOS 49  BILITOT 0.7  PROT 6.9  ALBUMIN 3.5   No results for input(s): LIPASE, AMYLASE in the last 168 hours. No results for input(s): AMMONIA in the last 168 hours. Coagulation Profile: No results for input(s): INR, PROTIME in the last 168 hours. Cardiac  Enzymes: Recent Labs  Lab 02/16/18 1623 02/16/18 2035 02/17/18 0302 02/17/18 0838  TROPONINI 0.18* 0.18* 0.14* 0.14*   BNP (last 3 results) No results for input(s): PROBNP in the last 8760 hours. HbA1C: Recent Labs    02/17/18 0302  HGBA1C 6.1*   CBG: No results for input(s): GLUCAP in the last 168 hours. Lipid Profile: Recent Labs    02/17/18 0302  CHOL 184  HDL 40*  LDLCALC 121*  TRIG 114  CHOLHDL 4.6   Thyroid Function Tests: Recent Labs    02/17/18 0302  TSH 0.629   Anemia Panel: No results for input(s): VITAMINB12, FOLATE, FERRITIN, TIBC, IRON, RETICCTPCT in the last 72 hours. Sepsis Labs: No results for input(s): PROCALCITON, LATICACIDVEN in the last 168 hours.  No results found for this or any previous visit (from the past 240 hour(s)).       Radiology Studies: Ct Head Wo Contrast  Result Date: 02/16/2018 CLINICAL DATA:  84 year old male with headache, dizziness and multiple falls. EXAM: CT HEAD WITHOUT CONTRAST TECHNIQUE: Contiguous axial images were obtained from the base of the skull through the vertex without intravenous contrast. COMPARISON:  07/05/2017 and prior CTs FINDINGS: Brain: No evidence of acute infarction, hemorrhage, hydrocephalus, extra-axial collection or mass lesion/mass effect. Moderate to severe atrophy and chronic small-vessel white matter ischemic changes again noted. Vascular: Atherosclerotic calcifications again noted. Skull: Normal. Negative for fracture or focal lesion. Sinuses/Orbits: No acute finding. Other: None. IMPRESSION: 1. No evidence of acute intracranial abnormality 2. Atrophy and chronic small-vessel white matter ischemic changes. Electronically Signed   By: Margarette Canada M.D.   On: 02/16/2018 21:45   Dg Chest Port 1 View  Result Date: 02/16/2018 CLINICAL DATA:  Altered mental status.  Dizziness.  Weakness. EXAM: PORTABLE CHEST 1 VIEW COMPARISON:  07/05/2017 FINDINGS: Stable mild cardiomegaly. Transvenous pacemaker remains  in appropriate position. Aortic atherosclerosis. Both lungs are clear. IMPRESSION: Stable mild cardiomegaly.  No active lung disease. Electronically Signed   By: Earle Gell M.D.   On: 02/16/2018 16:24        Scheduled Meds: . apixaban  2.5 mg Oral BID  . aspirin  324 mg Oral Daily  . atorvastatin  80 mg Oral q1800  . cholecalciferol  5,000 Units Oral Daily  . docusate sodium  100 mg Oral Daily  . [START ON 02/19/2018] Influenza vac split quadrivalent PF  0.5 mL Intramuscular Tomorrow-1000  . levothyroxine  175 mcg Oral QAC breakfast  . mirabegron ER  25 mg Oral BID  . multivitamin   Oral Daily  . zinc sulfate  220 mg Oral QODAY   Continuous Infusions: . sodium chloride 10 mL/hr at 02/17/18 1111     LOS: 1 day    Time spent: 45 minutes    Lady Deutscher, MD, FACP [facility] Pager [phone]  If 7PM-7AM, please contact night-coverage [email or web address] Password [email or web address]  02/18/2018, 2:11 PM

## 2018-02-19 MED ORDER — ENSURE ENLIVE PO LIQD
237.0000 mL | Freq: Three times a day (TID) | ORAL | Status: DC
  Administered 2018-02-19 – 2018-02-20 (×4): 237 mL via ORAL

## 2018-02-19 NOTE — NC FL2 (Signed)
[place] LEVEL OF CARE SCREENING TOOL     IDENTIFICATION  Patient Name: [name] Birthdate: [birth year] Sex: male Admission Date (Current Location): 02/16/2018  [facility] and [place] Number:  [employer] and Address:  [facility] [place] [place] [address] [place], [place] [address]      Provider Number: [id number]  Attending Physician Name and Address:  Elmarie Shiley, MD  Relative Name and Phone Number:       Current Level of Care: Hospital Recommended Level of Care: [place] Prior Approval Number:    Date Approved/Denied:   PASRR Number: [id number]  Discharge Plan: SNF    Current Diagnoses: Patient Active Problem List   Diagnosis Date Noted  . Elevated troponin 02/17/2018  . Pressure injury of skin 02/17/2018  . Complete heart block ([place])   . Fall 02/16/2018  . Dizziness 02/16/2018  . NSTEMI (non-ST elevated myocardial infarction) ([place]) 02/16/2018  . OAB (overactive bladder) 05/08/2016  . Atherosclerotic PVD with ulceration ([place]) 11/23/2013  . Chronic ulcer of right great toe ([place]) 11/23/2013  . Depression 10/02/2013  . Anxiety state 10/02/2013  . Foot ulcer, right ([place]) 10/02/2013  . Heart burn 06/02/2013  . Cough 06/02/2013  . Chronic kidney disease (CKD), stage IV (severe) ([place]) 09/30/2012  . CAD (coronary artery disease) of artery bypass graft 09/30/2012  . Hypothyroidism 09/30/2012  . Type II diabetes mellitus with renal manifestations ([place]) 09/30/2012  . Atrial fibrillation-paroxysmal 09/12/2010  . Coronary artery disease- Status post drug-eluting stent 09/12/2010  . Hyperlipidemia 02/02/2010  . AV BLOCK, COMPLETE 02/02/2010  . ORTHOSTATIC DIZZINESS 02/02/2010  . DYSPNEA ON EXERTION 02/02/2010  . Pacemaker-St. Jude 02/02/2010    Orientation RESPIRATION BLADDER Height & Weight     Self, Time, Situation, Place  Normal Incontinent, External catheter Weight: 191 lb 9.3 oz (86.9  kg) Height:  6\' 2"  (188 cm)  BEHAVIORAL SYMPTOMS/MOOD NEUROLOGICAL BOWEL NUTRITION STATUS  (None) (None) Continent Diet(Heart healthy/carb modified)  AMBULATORY STATUS COMMUNICATION OF NEEDS Skin   Limited Assist Verbally Other (Comment), PU Stage and Appropriate Care(MASD.) PU Stage 1 Dressing: ([place]ac[place]: Barrier prn.)                     Personal Care Assistance Level of Assistance  Bathing, Feeding, Dressing Bathing Assistance: Maximum assistance Feeding assistance: Limited assistance Dressing Assistance: Maximum assistance     Functional Limitations Info  Sight, Hearing, Speech Sight Info: Impaired(Legally blind) Hearing Info: Impaired Speech Info: Adequate    SPECIAL CARE FACTORS FREQUENCY  PT (By licensed PT), OT (By licensed OT)     PT Frequency: 5 x week OT Frequency: 5 x week            Contractures Contractures Info: Not present    Additional Factors Info  Code Status, Allergies Code Status Info: DNR Allergies Info: NKDA           Current Medications (02/19/2018):  This is the current hospital active medication list Current Facility-Administered Medications  Medication Dose Route Frequency Provider Last Rate Last Dose  . 0.9 %  sodium chloride infusion   Intravenous Continuous Radene Gunning, NP 10 mL/hr at 02/17/18 1111    . acetaminophen (TYLENOL) tablet 650 mg  650 mg Oral Q4H PRN Ivor Costa, MD      . ALPRAZolam Duanne Moron) tablet 0.25 mg  0.25 mg Oral TID PRN Ivor Costa, MD   0.25 mg at 02/17/18 1659  . apixaban (ELIQUIS) tablet  2.5 mg  2.5 mg Oral BID Ivor Costa, MD   2.5 mg at 02/19/18 6834  . aspirin chewable tablet 324 mg  324 mg Oral Daily Ivor Costa, MD   324 mg at 02/19/18 1962  . atorvastatin (LIPITOR) tablet 80 mg  80 mg Oral q1800 Ivor Costa, MD   80 mg at 02/18/18 1736  . cholecalciferol (VITAMIN D) tablet 5,000 Units  5,000 Units Oral Daily Ivor Costa, MD   5,000 Units at 02/19/18 0827  . docusate sodium (COLACE) capsule 100 mg  100 mg  Oral Daily Ivor Costa, MD   100 mg at 02/19/18 2297  . feeding supplement (ENSURE ENLIVE) (ENSURE ENLIVE) liquid 237 mL  237 mL Oral TID BM Regalado, Belkys A, MD      . hydrALAZINE (APRESOLINE) injection 5 mg  5 mg Intravenous Q2H PRN Ivor Costa, MD      . hydrOXYzine (ATARAX/VISTARIL) tablet 10 mg  10 mg Oral TID PRN Ivor Costa, MD      . levothyroxine (SYNTHROID, LEVOTHROID) tablet 175 mcg  175 mcg Oral QAC breakfast Ivor Costa, MD   175 mcg at 02/19/18 0828  . mirabegron ER (MYRBETRIQ) tablet 25 mg  25 mg Oral BID Ivor Costa, MD   25 mg at 02/19/18 9892  . morphine 2 MG/ML injection 1 mg  1 mg Intravenous Q4H PRN Ivor Costa, MD      . multivitamin liquid   Oral Daily Ivor Costa, MD   15 mL at 02/19/18 0828  . nitroGLYCERIN (NITROSTAT) SL tablet 0.4 mg  0.4 mg Sublingual Q5 min PRN Ivor Costa, MD      . polyvinyl alcohol (LIQUIFILM TEARS) 1.4 % ophthalmic solution 1 drop  1 drop Both Eyes Daily PRN Ivor Costa, MD   1 drop at 02/18/18 1438  . zinc sulfate capsule 220 mg  220 mg Oral Henreitta Cea, MD   220 mg at 02/19/18 0829  . zolpidem (AMBIEN) tablet 5 mg  5 mg Oral QHS PRN Ivor Costa, MD         Discharge Medications: Please see discharge summary for a list of discharge medications.  Relevant Imaging Results:  Relevant Lab Results:   Additional Information SS#: [id number]  Candie Chroman, LCSW

## 2018-02-19 NOTE — Discharge Instructions (Signed)

## 2018-02-19 NOTE — Progress Notes (Signed)
Physical Therapy Treatment Patient Details Name: [name] MRN: [id number] DOB: [birth year] Today's Date: 02/19/2018    History of Present Illness 84 y.o. male with medical history significant of hypertension, hyperlipidemia, diet-controlled diabetes mellitus, hypothyroidism, anxiety, A. fib on Eliquis, CAD, stent placement, complete heart block, pacemaker placement, hard of hearing, CKD- 4, who presents with fall and dizziness.    PT Comments    Pt with improved cognition. Pt able to tolerate ambulation this date with RW and modAx2. Pt reports dizziness but was not retropulsive or resisting mobility. Pt cont to require 24/7 ASSIST for safe transition home. Pt is at an extremely high falls risk and cont to experience dizziness with mobility. Acute PT to cont to follow.    Follow Up Recommendations  Home health PT;Supervision/Assistance - 24 hour(pt refusing SNF)     Equipment Recommendations  None recommended by PT    Recommendations for Other Services       Precautions / Restrictions Precautions Precautions: Fall Precaution Comments: extremely HOH Restrictions Weight Bearing Restrictions: No    Mobility  Bed Mobility Overal bed mobility: Needs Assistance Bed Mobility: Supine to Sit       Sit to supine: Mod assist;+2 for physical assistance;HOB elevated   General bed mobility comments: max directional verbal and tactile cues, pt initiated LE movement, modAx2 for trunk elevation and to bring hips to EOB  Transfers Overall transfer level: Needs assistance Equipment used: Rolling walker (2 wheeled) Transfers: Sit to/from Stand Sit to Stand: Mod assist;+2 physical assistance         General transfer comment: pt onset of dizziness but was able to stand with bilat UE on RW without retropulsion. modAx2 to power up  Ambulation/Gait Ambulation/Gait assistance: Mod assist;+2 safety/equipment Gait Distance (Feet): 75 Feet Assistive device: Rolling walker (2  wheeled) Gait Pattern/deviations: Step-through pattern;Decreased stride length;Shuffle;Narrow base of support;Trunk flexed Gait velocity: slow Gait velocity interpretation: <1.31 ft/sec, indicative of household ambulator General Gait Details: 2nd person for chair follow and line management, pt required constant tactile cues for anterior progression, modA for walker management and to maintain straight line and forward momentum   Chief Strategy Officer    Modified Rankin (Stroke Patients Only)       Balance Overall balance assessment: Needs assistance Sitting-balance support: No upper extremity supported;Feet supported Sitting balance-Leahy Scale: Fair     Standing balance support: Bilateral upper extremity supported;During functional activity Standing balance-Leahy Scale: Poor Standing balance comment: Reliant on UE support                            Cognition Arousal/Alertness: Awake/alert Behavior During Therapy: WFL for tasks assessed/performed Overall Cognitive Status: Impaired/Different from baseline Area of Impairment: Safety/judgement;Memory                     Memory: Decreased short-term memory   Safety/Judgement: Decreased awareness of safety     General Comments: pt extremely HOH limiting command follow but once patient hears properly pt consistently follows simple commands      Exercises      General Comments General comments (skin integrity, edema, etc.): SPO2 >95% on 2LO2 via [place]      Pertinent Vitals/Pain Pain Assessment: No/denies pain    Home Living                      Prior Function  PT Goals (current goals can now be found in the care plan section) Acute Rehab PT Goals Patient Stated Goal: go home today Progress towards PT goals: Progressing toward goals    Frequency    Min 3X/week      PT Plan Current plan remains appropriate    Co-evaluation               AM-PAC PT "6 Clicks" Daily Activity  Outcome Measure  Difficulty turning over in bed (including adjusting bedclothes, sheets and blankets)?: Unable Difficulty moving from lying on back to sitting on the side of the bed? : Unable Difficulty sitting down on and standing up from a chair with arms (e.g., wheelchair, bedside commode, etc,.)?: Unable Help needed moving to and from a bed to chair (including a wheelchair)?: A Lot Help needed walking in hospital room?: A Lot Help needed climbing 3-5 steps with a railing? : Total 6 Click Score: 8    End of Session Equipment Utilized During Treatment: Gait belt Activity Tolerance: Patient tolerated treatment well Patient left: in chair;with call bell/phone within reach;with chair alarm set Nurse Communication: Mobility status PT Visit Diagnosis: Unsteadiness on feet (R26.81);Repeated falls (R29.6)     Time: 7615-1834 PT Time Calculation (min) (ACUTE ONLY): 24 min  Charges:  $Gait Training: 23-37 mins                     Kittie Plater, PT, DPT Acute Rehabilitation Services Pager #: [phone] Office #: [phone]    Berline Lopes 02/19/2018, 10:33 AM

## 2018-02-19 NOTE — Progress Notes (Addendum)
PROGRESS NOTE    [name]  DGL:[id number] DOB: [birth year] DOA: 02/16/2018 PCP: [name], MD    Brief Narrative: 84 year old male with history of hypertension, hyperlipidemia, diabetes type 2, hypothyroidism, anxiety, atrial fibrillation on Eliquis, coronary artery disease, stent placement, permanent pacemaker, chronic kidney disease, extremely hard of hearing and legally blind who presents with multiple falls and dizziness.  He is being treated for a non-ST elevation MI, paroxysmal atrial fibrillation, falls and dizziness.    Cardiology saw the patient and felt that he had demand ischemia.  I had an extensive discussion with the patient's daughter [name] and HER-2 brothers.  By telephone conference they expressed to me their sincere desire to keep the patient home as long as possible.  Given his significant debility and dizziness and falls physical therapy had been recommending skilled placement.  Family states they have 5 caregivers who provide 24/7 support.  They feel that keeping him at home is very important to maintain his quality of life.  They instructed me to place a DO NOT RESUSCITATE order.  And in discussions we talked about palliative care and hospice at home.  They are very interested in this as patient has been declining.  He has not been eating.  Is worsening chronic kidney disease.  He is having anxiety, and much confusion while in the hospital.  Patient's multiple medical problems I feel that he does have a 62-monthor less life expectancy.  Going to consult palliative medicine for assistance with symptom management prior to discharge home and for possible referral to hospice.   Assessment & Plan:   Principal Problem:   NSTEMI (non-ST elevated myocardial infarction) (HDover Active Problems:   Hyperlipidemia   Pacemaker-St. Jude   Atrial fibrillation-paroxysmal   Chronic kidney disease (CKD), stage IV (severe) (HCC)   CAD (coronary artery disease) of artery bypass  graft   Hypothyroidism   Type II diabetes mellitus with renal manifestations (HCC)   Anxiety state   Fall   Dizziness   Elevated troponin   Complete heart block (HCC)   Pressure injury of skin   NSTEMIand hx of CAD: s/p of stent. Hx CAD with chronically occluded right coronary artery per chart.  Trop 0.18>>>0.18>>0.14>>0.14. No CP. No events on tele. Repeat ekg reveals electronic ventricular pacemaker. - continue  prn Nitroglycerin, Morphine - continueaspirin, lipitor 80 mg daily -Echocardiogram shows diffuse hypokinesis with EF of 30 to 35% Event was thought to be a demand ischemia. Cardiology not planning further evaluation.   Hyperlipidemia:not taking statin at home. -started lipitor -f/u FLp  Atrial fibrillation-paroxysmal:CHA2DS2-VASc Scoreis 5, needs oral anticoagulation. Patient is on Eliquis at home. Heart rate is well controlled. --continue Eliquis  Chronic kidney disease (CKD), stage IV (severe) (HCC):Slightly worsening at time of admission.Baseline creatinine 1.2-1.3. BUN 35. His creatinine was 1.58 on admission (patient had a creatinine of 1.64 on 09/23/2017), -IV fluid: Patient received 500 cc normal saline in the ED, will discontinue fluids given ejection fraction. -monitor urine output -cr stable. Repeat labs in am.   Hypothyroidism: Last TSH was11.5on 05/24/17 -tsh 0.629 today -Continue home Synthroid -OP follow up  Diet-controlledType II diabetes mellitus with renal manifestations (HCC):Last A1c5.8 on 10/02/16, well controled. Patient isnottakingmedsat home. CBG 94 on admission -f/uA1c   Anxiety state with confusion: -prn Xanax   FallandDizziness:Etiology is not clear. Patient does not have focal neurologic findings on physical examination. CT head without acute abnormality. the patient cannot do MRI due toPPM.Per radiology, "Patients pacemaker is conditional however the  1688 leads are not labeled as conditional. Per policy  we do not scan these". Due to CKD-IV, pt cannot do CTA of head or neck. Advanced age will discontinue meclizine PT following.   Bleeding from penis: pt has minor bleeding fromminor penile skin tear, not from meatus. Hgb 12.5<---12.2 on 09/13/17, stable. -no further bleeding -wound cared consult  Goals of care.  Family now wishes  SNF  Awaiting palliative care consult for goals of care.   Appreciate palliative medicine consultation  Code Status: DNR Family Communication:  son at bedside.  Disposition Plan:  Caretakers and hospice   Consultants:  cardiology  Procedures:  none  Antibiotics:  none  Subjective: Denies dyspnea, hard of hearing. Not eating a lot.  Likes ensure.   Objective: Vitals:   02/18/18 1218 02/18/18 1955 02/19/18 0459 02/19/18 1246  BP: 123/63 (!) 136/58 (!) 119/56 (!) 119/55  Pulse: 70 70 70 70  Resp: '18 16 16 16  ' Temp: 98.1 F (36.7 C) 97.9 F (36.6 C) 98 F (36.7 C) (!) 97.4 F (36.3 C)  TempSrc: Oral Oral  Oral  SpO2: 97% 97% 95% 100%  Weight:   86.9 kg   Height: '6\' 2"'  (1.88 m)       Intake/Output Summary (Last 24 hours) at 02/19/2018 1547 Last data filed at 02/19/2018 0934 Gross per 24 hour  Intake 400 ml  Output 400 ml  Net 0 ml   Filed Weights   02/17/18 0446 02/18/18 0452 02/19/18 0459  Weight: 88 kg 87.2 kg 86.9 kg    Examination:  General exam: nad.  Respiratory system: no wheezing or crackles.  Cardiovascular system: S 1, S 2 RRR Gastrointestinal system: BS present, soft, nt Central nervous system: Alert.  Extremities: Symmetric power.  Skin: No rashes.     Data Reviewed: I have personally reviewed following labs and imaging studies  CBC: Recent Labs  Lab 02/16/18 1623  WBC 7.8  NEUTROABS 5.3  HGB 12.5*  HCT 40.3  MCV 90.2  PLT 578   Basic Metabolic Panel: Recent Labs  Lab 02/16/18 1623 02/17/18 1151 02/18/18 0817  NA 140 137 139  K 4.2 4.4 3.8  CL 105 108 108  CO2 21* 21* 21*  GLUCOSE 94  118* 118*  BUN 35* 29* 30*  CREATININE 1.58* 1.40* 1.46*  CALCIUM 9.2 8.7* 8.6*   GFR: Estimated Creatinine Clearance: 36.8 mL/min (A) (by C-G formula based on SCr of 1.46 mg/dL (H)). Liver Function Tests: Recent Labs  Lab 02/16/18 1623  AST 22  ALT 13  ALKPHOS 49  BILITOT 0.7  PROT 6.9  ALBUMIN 3.5   No results for input(s): LIPASE, AMYLASE in the last 168 hours. No results for input(s): AMMONIA in the last 168 hours. Coagulation Profile: No results for input(s): INR, PROTIME in the last 168 hours. Cardiac Enzymes: Recent Labs  Lab 02/16/18 1623 02/16/18 2035 02/17/18 0302 02/17/18 0838  TROPONINI 0.18* 0.18* 0.14* 0.14*   BNP (last 3 results) No results for input(s): PROBNP in the last 8760 hours. HbA1C: Recent Labs    02/17/18 0302  HGBA1C 6.1*   CBG: No results for input(s): GLUCAP in the last 168 hours. Lipid Profile: Recent Labs    02/17/18 0302  CHOL 184  HDL 40*  LDLCALC 121*  TRIG 114  CHOLHDL 4.6   Thyroid Function Tests: Recent Labs    02/17/18 0302  TSH 0.629   Anemia Panel: No results for input(s): VITAMINB12, FOLATE, FERRITIN, TIBC, IRON, RETICCTPCT in the last  72 hours. Sepsis Labs: No results for input(s): PROCALCITON, LATICACIDVEN in the last 168 hours.  No results found for this or any previous visit (from the past 240 hour(s)).       Radiology Studies: No results found.      Scheduled Meds: . apixaban  2.5 mg Oral BID  . aspirin  324 mg Oral Daily  . atorvastatin  80 mg Oral q1800  . cholecalciferol  5,000 Units Oral Daily  . docusate sodium  100 mg Oral Daily  . feeding supplement (ENSURE ENLIVE)  237 mL Oral TID BM  . levothyroxine  175 mcg Oral QAC breakfast  . mirabegron ER  25 mg Oral BID  . multivitamin   Oral Daily  . zinc sulfate  220 mg Oral QODAY   Continuous Infusions: . sodium chloride 10 mL/hr at 02/17/18 1111     LOS: 2 days    Time spent: 45 minutes    Elmarie Shiley, MD [facility] Pager [phone]  If 7PM-7AM, please contact night-coverage [email or web address] Password [facility] 02/19/2018, 3:47 PM

## 2018-02-19 NOTE — Clinical Social Work Placement (Signed)
   CLINICAL SOCIAL WORK PLACEMENT  NOTE  Date:  02/19/2018  Patient Details  Name: [name] MRN: [id number] Date of Birth: 07-25-1921  Clinical Social Work is seeking post-discharge placement for this patient at the [place] level of care (*CSW will initial, date and re-position this form in  chart as items are completed):  Yes   Patient/family provided with [place] Work Department's list of facilities offering this level of care within the geographic area requested by the patient (or if unable, by the patient's family).  Yes   Patient/family informed of their freedom to choose among providers that offer the needed level of care, that participate in Medicare, Medicaid or managed care program needed by the patient, have an available bed and are willing to accept the patient.  Yes   Patient/family informed of [facility]'s ownership interest in [facility] and [facility], as well as of the fact that they are under no obligation to receive care at these facilities.  PASRR submitted to EDS on 02/19/18     PASRR number received on 02/19/18     Existing PASRR number confirmed on       FL2 transmitted to all facilities in geographic area requested by pt/family on 02/19/18     FL2 transmitted to all facilities within larger geographic area on       Patient informed that his/her managed care company has contracts with or will negotiate with certain facilities, including the following:            Patient/family informed of bed offers received.  Patient chooses bed at       Physician recommends and patient chooses bed at      Patient to be transferred to   on  .  Patient to be transferred to facility by       Patient family notified on   of transfer.  Name of family member notified:        PHYSICIAN Please sign FL2     Additional Comment:    _______________________________________________ Candie Chroman, LCSW 02/19/2018, 1:12  PM

## 2018-02-19 NOTE — Progress Notes (Signed)
Patient  Son to security to retrieve 60.00 security is holding. Yellow form with family.

## 2018-02-19 NOTE — Progress Notes (Signed)
Palliative:  Spoke with patient's daughter, [name], via telephone. Robin requests [place] meeting tomorrow (9/12) at 10 am to discuss hospice at home.  Thank you for this consult.  Juel Burrow, DNP, AGNP-C Palliative Medicine Team Team Phone # [phone]  Pager # [phone]  NO CHARGE

## 2018-02-19 NOTE — Clinical Social Work Note (Addendum)
Patient's son and daughter now interested in SNF placement. Discussed with patient and he is agreeable as well. First preference is [facility]. Daughter has been in contact with their staff. CSW sent referral and left voicemail for admissions coordinator.  Dayton Scrape, [place]  2:05 pm [facility] SNF is able to offer patient a bed. Per MD, patient will hopefully be ready for discharge tomorrow. SNF and daughter aware.  Dayton Scrape, [place]

## 2018-02-20 DIAGNOSIS — N184 Chronic kidney disease, stage 4 (severe): Secondary | ICD-10-CM | POA: Diagnosis not present

## 2018-02-20 DIAGNOSIS — E1122 Type 2 diabetes mellitus with diabetic chronic kidney disease: Secondary | ICD-10-CM | POA: Diagnosis not present

## 2018-02-20 DIAGNOSIS — I21A9 Other myocardial infarction type: Secondary | ICD-10-CM | POA: Diagnosis not present

## 2018-02-20 DIAGNOSIS — I480 Paroxysmal atrial fibrillation: Secondary | ICD-10-CM | POA: Diagnosis not present

## 2018-02-20 DIAGNOSIS — Z9181 History of falling: Secondary | ICD-10-CM | POA: Diagnosis not present

## 2018-02-20 DIAGNOSIS — Z515 Encounter for palliative care: Secondary | ICD-10-CM

## 2018-02-20 DIAGNOSIS — I2510 Atherosclerotic heart disease of native coronary artery without angina pectoris: Secondary | ICD-10-CM | POA: Diagnosis not present

## 2018-02-20 DIAGNOSIS — R279 Unspecified lack of coordination: Secondary | ICD-10-CM | POA: Diagnosis not present

## 2018-02-20 DIAGNOSIS — Z743 Need for continuous supervision: Secondary | ICD-10-CM | POA: Diagnosis not present

## 2018-02-20 DIAGNOSIS — R05 Cough: Secondary | ICD-10-CM | POA: Diagnosis not present

## 2018-02-20 DIAGNOSIS — Z7189 Other specified counseling: Secondary | ICD-10-CM

## 2018-02-20 DIAGNOSIS — F419 Anxiety disorder, unspecified: Secondary | ICD-10-CM | POA: Diagnosis not present

## 2018-02-20 DIAGNOSIS — J811 Chronic pulmonary edema: Secondary | ICD-10-CM | POA: Diagnosis not present

## 2018-02-20 DIAGNOSIS — I214 Non-ST elevation (NSTEMI) myocardial infarction: Secondary | ICD-10-CM | POA: Diagnosis not present

## 2018-02-20 DIAGNOSIS — M6281 Muscle weakness (generalized): Secondary | ICD-10-CM | POA: Diagnosis not present

## 2018-02-20 DIAGNOSIS — E7849 Other hyperlipidemia: Secondary | ICD-10-CM | POA: Diagnosis not present

## 2018-02-20 DIAGNOSIS — Z23 Encounter for immunization: Secondary | ICD-10-CM | POA: Diagnosis not present

## 2018-02-20 DIAGNOSIS — R2689 Other abnormalities of gait and mobility: Secondary | ICD-10-CM | POA: Diagnosis not present

## 2018-02-20 LAB — BASIC METABOLIC PANEL
ANION GAP: 8 (ref 5–15)
BUN: 31 mg/dL — AB (ref 8–23)
CALCIUM: 8.6 mg/dL — AB (ref 8.9–10.3)
CO2: 25 mmol/L (ref 22–32)
Chloride: 107 mmol/L (ref 98–111)
Creatinine, Ser: 1.29 mg/dL — ABNORMAL HIGH (ref 0.61–1.24)
GFR calc Af Amer: 53 mL/min — ABNORMAL LOW (ref >60)
GFR, EST NON AFRICAN AMERICAN: 46 mL/min — AB (ref >60)
GLUCOSE: 132 mg/dL — AB (ref 70–99)
Potassium: 4.1 mmol/L (ref 3.5–5.1)
SODIUM: 140 mmol/L (ref 135–145)

## 2018-02-20 LAB — GLUCOSE, CAPILLARY: GLUCOSE-CAPILLARY: 106 mg/dL — AB (ref 70–99)

## 2018-02-20 MED ORDER — ATORVASTATIN CALCIUM 80 MG PO TABS: 80.0000 mg | ORAL_TABLET | Freq: Every day | ORAL | 0 refills | Status: DC

## 2018-02-20 MED ORDER — ALPRAZOLAM 0.25 MG PO TABS: 0.2500 mg | ORAL_TABLET | Freq: Three times a day (TID) | ORAL | 0 refills | Status: DC | PRN

## 2018-02-20 MED ORDER — ENSURE ENLIVE PO LIQD: 237.0000 mL | Freq: Three times a day (TID) | ORAL | 12 refills | Status: AC
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
## 2018-02-20 NOTE — Discharge Summary (Signed)
Physician Discharge Summary  [name] TKZ:[id number] DOB: [birth year] DOA: 02/16/2018  PCP: [name], MD  Admit date: 02/16/2018 Discharge date: 02/20/2018  Admitted From: Home  Disposition:  SNF  Recommendations for Outpatient Follow-up:  1. Follow up with PCP in 1-2 weeks 2. Please obtain BMP/CBC in one week 3. Needs Palliative care follow up at SNF 4. Follow up with cardiology for heart failure.    Discharge Condition: Stable.  CODE STATUS: DNR Diet recommendation: Heart Healthy /   Brief/Interim Summary:  NSTEMIand hx of CAD: s/p of stent.Hx CAD with chronically occluded right coronary artery per chart.Trop 0.18>>>0.18>>0.14>>0.14. No CP. No events on tele. Repeat ekg reveals electronic ventricular pacemaker. Systolic Heart failure, Ef 30% compensated  -continueprn Nitroglycerin, Morphine -continueaspirin, lipitor 80 mg daily -Echocardiogram shows diffuse hypokinesis with EF of 30 to 35% Event was thought to be a demand ischemia. Cardiology not planning further evaluation.   Hyperlipidemia:not taking statin at home. -started lipitor -f/u FLp  Atrial fibrillation-paroxysmal:CHA2DS2-VASc Scoreis 5, needs oral anticoagulation. Patient is on Eliquis at home. Heart rate is well controlled. --continue Eliquis  Chronic kidney disease (CKD), stage IV (severe) (HCC):Slightly worseningat time of admission.Baseline creatinine 1.2-1.3. BUN 35. His creatininewas1.58 on admission(patient had a creatinine of 1.64 on 09/23/2017), -IV fluid: Patient received 500 cc normal saline in the ED, will discontinue fluids given ejection fraction. -monitor urine output -cr stable. Repeat labs in am.   Hypothyroidism: Last TSH was11.5on 05/24/17 -tsh 0.629 today -Continue home Synthroid -OP follow up  Diet-controlledType II diabetes mellitus with renal manifestations (HCC):Last A1c5.8 on 10/02/16, well controled. Patient isnottakingmedsat home. CBG  94on admission -f/uA1c   Anxiety state with confusion: -prn Xanax  FallandDizziness:Etiology is not clear. Patient does not have focal neurologic findings on physical examination.CT head without acute abnormality.the patient cannot do MRI due toPPM.Per radiology, "Patients pacemaker is conditional however the 1688 leads are not labeled as conditional. Per policy we do not scan these". Due to CKD-IV, pt cannot do CTA of head or neck. Advanced age will discontinue meclizine PT following.   Bleeding from penis: pt has minor bleeding fromminor penile skin tear, not from meatus. Hgb 12.5<---12.2 on 09/13/17, stable. -no further bleeding -wound cared consult  Goals of care.  Family now wishes  SNF  Discharge to SNF with palliative following.    Discharge Diagnoses:  Principal Problem:   NSTEMI (non-ST elevated myocardial infarction) ([place]) Active Problems:   Hyperlipidemia   Pacemaker-[employer]. [employer]   Atrial fibrillation-paroxysmal   Chronic kidney disease (CKD), stage IV (severe) (HCC)   CAD (coronary artery disease) of artery bypass graft   Hypothyroidism   Type II diabetes mellitus with renal manifestations (HCC)   Anxiety state   Fall   Dizziness   Elevated troponin   Complete heart block (HCC)   Pressure injury of skin    Discharge Instructions  Discharge Instructions    Diet - low sodium heart healthy   Complete by:  As directed    Increase activity slowly   Complete by:  As directed      Allergies as of 02/20/2018   No Known Allergies     Medication List    STOP taking these medications   Potassium 99 MG Tabs     TAKE these medications   ALPRAZolam 0.25 MG tablet Commonly known as:  XANAX Take 1 tablet (0.25 mg total) by mouth 3 (three) times daily as needed for anxiety. What changed:    medication strength  how much to  take  when to take this  reasons to take this  additional instructions   apixaban 2.5 MG Tabs tablet Commonly known  as:  ELIQUIS Take 1 tablet (2.5 mg total) by mouth 2 (two) times daily.   atorvastatin 80 MG tablet Commonly known as:  LIPITOR Take 1 tablet (80 mg total) by mouth daily at 6 PM.   docusate sodium 100 MG capsule Commonly known as:  COLACE Take 100 mg by mouth daily.   feeding supplement (ENSURE ENLIVE) Liqd Take 237 mLs by mouth 3 (three) times daily between meals.   glucose blood test strip Test BS BID and PRN. Dx e11.9   ketoconazole 2 % shampoo Commonly known as:  NIZORAL Apply 1 application topically 3 (three) times a week.   levothyroxine 175 MCG tablet Commonly known as:  SYNTHROID, LEVOTHROID TAKE (1) TABLET DAILY BE- FORE BREAKFAST. What changed:  See the new instructions.   MULTIVITAMIN PO Take 1 tablet by mouth daily.   MYRBETRIQ 25 MG Tb24 tablet Generic drug:  mirabegron ER Take 25 mg by mouth 2 (two) times daily.   nitroGLYCERIN 0.4 MG SL tablet Commonly known as:  NITROSTAT Place 1 tablet (0.4 mg total) under the tongue every 5 (five) minutes as needed for chest pain (Do not exceed 3 doses).   THERATEARS OP Apply 1 drop to eye daily as needed (for dry eyes).   Vitamin D3 5000 units Caps Take 5,000 Units by mouth daily.   ZINC PO Take 1 tablet by mouth every other day.      Contact information for after-discharge care    Destination    [facility] .   Service:  Skilled Nursing Contact information: [address] [place] [phone]             No Known Allergies  Consultations: Cardiology  Palliative   Procedures/Studies: Ct Head Wo Contrast  Result Date: 02/16/2018 CLINICAL DATA:  84 year old male with headache, dizziness and multiple falls. EXAM: CT HEAD WITHOUT CONTRAST TECHNIQUE: Contiguous axial images were obtained from the base of the skull through the vertex without intravenous contrast. COMPARISON:  07/05/2017 and prior CTs FINDINGS: Brain:  No evidence of acute infarction, hemorrhage, hydrocephalus, extra-axial collection or mass lesion/mass effect. Moderate to severe atrophy and chronic small-vessel white matter ischemic changes again noted. Vascular: Atherosclerotic calcifications again noted. Skull: Normal. Negative for fracture or focal lesion. Sinuses/Orbits: No acute finding. Other: None. IMPRESSION: 1. No evidence of acute intracranial abnormality 2. Atrophy and chronic small-vessel white matter ischemic changes. Electronically Signed   By: Margarette Canada M.D.   On: 02/16/2018 21:45   Dg Chest Port 1 View  Result Date: 02/16/2018 CLINICAL DATA:  Altered mental status.  Dizziness.  Weakness. EXAM: PORTABLE CHEST 1 VIEW COMPARISON:  07/05/2017 FINDINGS: Stable mild cardiomegaly. Transvenous pacemaker remains in appropriate position. Aortic atherosclerosis. Both lungs are clear. IMPRESSION: Stable mild cardiomegaly.  No active lung disease. Electronically Signed   By: [place] Gell M.D.   On: 02/16/2018 16:24   Subjective: Feeling well, no chest pain   Discharge Exam: Vitals:   02/20/18 0925 02/20/18 0926  BP:  130/63  Pulse:  69  Resp:  (!) 21  Temp: 99.1 F (37.3 C)   SpO2:  94%   Vitals:   02/20/18 0356 02/20/18 0759 02/20/18 0925 02/20/18 0926  BP: 135/63 (!) 132/58  130/63  Pulse: 70 70  69  Resp: 18 (!) 22  (!)  21  Temp: 99 F (37.2 C) 99.5 F (37.5 C) 99.1 F (37.3 C)   TempSrc: Oral Oral Oral   SpO2: 97% 94%  94%  Weight: 87.1 kg     Height:        General: Pt is alert, awake, not in acute distress Cardiovascular: RRR, S1/S2 +, no rubs, no gallops Respiratory: CTA bilaterally, no wheezing, no rhonchi Abdominal: Soft, NT, ND, bowel sounds + Extremities: no edema, no cyanosis    The results of significant diagnostics from this hospitalization (including imaging, microbiology, ancillary and laboratory) are listed below for reference.     Microbiology: No results found for this or any previous visit (from  the past 240 hour(s)).   Labs: BNP (last 3 results) No results for input(s): BNP in the last 8760 hours. Basic Metabolic Panel: Recent Labs  Lab 02/16/18 1623 02/17/18 1151 02/18/18 0817 02/20/18 0340  NA 140 137 139 140  K 4.2 4.4 3.8 4.1  CL 105 108 108 107  CO2 21* 21* 21* 25  GLUCOSE 94 118* 118* 132*  BUN 35* 29* 30* 31*  CREATININE 1.58* 1.40* 1.46* 1.29*  CALCIUM 9.2 8.7* 8.6* 8.6*   Liver Function Tests: Recent Labs  Lab 02/16/18 1623  AST 22  ALT 13  ALKPHOS 49  BILITOT 0.7  PROT 6.9  ALBUMIN 3.5   No results for input(s): LIPASE, AMYLASE in the last 168 hours. No results for input(s): AMMONIA in the last 168 hours. CBC: Recent Labs  Lab 02/16/18 1623  WBC 7.8  NEUTROABS 5.3  HGB 12.5*  HCT 40.3  MCV 90.2  PLT 221   Cardiac Enzymes: Recent Labs  Lab 02/16/18 1623 02/16/18 2035 02/17/18 0302 02/17/18 0838  TROPONINI 0.18* 0.18* 0.14* 0.14*   BNP: Invalid input(s): POCBNP CBG: Recent Labs  Lab 02/20/18 0755  GLUCAP 106*   D-Dimer No results for input(s): DDIMER in the last 72 hours. Hgb A1c No results for input(s): HGBA1C in the last 72 hours. Lipid Profile No results for input(s): CHOL, HDL, LDLCALC, TRIG, CHOLHDL, LDLDIRECT in the last 72 hours. Thyroid function studies No results for input(s): TSH, T4TOTAL, T3FREE, THYROIDAB in the last 72 hours.  Invalid input(s): FREET3 Anemia work up No results for input(s): VITAMINB12, FOLATE, FERRITIN, TIBC, IRON, RETICCTPCT in the last 72 hours. Urinalysis    Component Value Date/Time   COLORURINE YELLOW 02/16/2018 2109   APPEARANCEUR CLEAR 02/16/2018 2109   APPEARANCEUR Clear 05/27/2017 1233   LABSPEC 1.017 02/16/2018 2109   PHURINE 5.0 02/16/2018 2109   GLUCOSEU NEGATIVE 02/16/2018 2109   HGBUR SMALL (A) 02/16/2018 2109   BILIRUBINUR NEGATIVE 02/16/2018 2109   BILIRUBINUR Negative 05/27/2017 [place] 02/16/2018 2109   PROTEINUR NEGATIVE 02/16/2018 2109    UROBILINOGEN 1.0 04/02/2015 1300   NITRITE NEGATIVE 02/16/2018 2109   LEUKOCYTESUR SMALL (A) 02/16/2018 2109   LEUKOCYTESUR Negative 05/27/2017 1233   Sepsis Labs Invalid input(s): PROCALCITONIN,  WBC,  LACTICIDVEN Microbiology No results found for this or any previous visit (from the past 240 hour(s)).   Time coordinating discharge: 35  minutes  SIGNED:   Elmarie Shiley, MD  [facility] 02/20/2018, 10:50 AM Pager [phone]  If 7PM-7AM, please contact night-coverage [email or web address] Password [email or web address]

## 2018-02-20 NOTE — Clinical Social Work Placement (Signed)
   CLINICAL SOCIAL WORK PLACEMENT  NOTE  Date:  02/20/2018  Patient Details  Name: [name] MRN: [id number] Date of Birth: 01/14/1922  Clinical Social Work is seeking post-discharge placement for this patient at the [place] level of care (*CSW will initial, date and re-position this form in  chart as items are completed):  Yes   Patient/family provided with [place] Work Department's list of facilities offering this level of care within the geographic area requested by the patient (or if unable, by the patient's family).  Yes   Patient/family informed of their freedom to choose among providers that offer the needed level of care, that participate in Medicare, Medicaid or managed care program needed by the patient, have an available bed and are willing to accept the patient.  Yes   Patient/family informed of [facility]'s ownership interest in [facility] and [facility], as well as of the fact that they are under no obligation to receive care at these facilities.  PASRR submitted to EDS on 02/19/18     PASRR number received on 02/19/18     Existing PASRR number confirmed on       FL2 transmitted to all facilities in geographic area requested by pt/family on 02/19/18     FL2 transmitted to all facilities within larger geographic area on       Patient informed that his/her managed care company has contracts with or will negotiate with certain facilities, including the following:        Yes   Patient/family informed of bed offers received.  Patient chooses bed at [facility])     Physician recommends and patient chooses bed at      Patient to be transferred to [facility]) on 02/20/18.  Patient to be transferred to facility by [facility]     Patient family notified on 02/20/18 of transfer.  Name of family member notified:  [name]     PHYSICIAN Please prepare prescriptions, Please sign DNR     Additional Comment:     _______________________________________________ Candie Chroman, LCSW 02/20/2018, 11:50 AM

## 2018-02-20 NOTE — Clinical Social Work Note (Signed)
CSW facilitated patient discharge including contacting patient family and facility to confirm patient discharge plans. Clinical information faxed to facility and family agreeable with plan. CSW arranged ambulance transport via [facility] to [facility]. RN to call report prior to discharge [phone] [facility]).  CSW will sign off for now as social work intervention is no longer needed. Please consult [place] again if new needs arise.  [place]ton Scrape, [place]

## 2018-02-20 NOTE — Clinical Social Work Note (Signed)
CSW sent discharge summary to SNF on the hub and left voicemail for admissions coordinator. Will set up transport when she calls back.  Dayton Scrape, [place]

## 2018-02-20 NOTE — Progress Notes (Signed)
[facility] at bedside to transport to SNF.  Patient observed in right and left ears.  Personal belongings include pajamas and hearing aides.  His daughter, [name] took all other belongings with her and will take to facility.  Report called to [place] at facility. AVS with next dose due for medications included in discharge envelope.  [name], patient's daughter called to inform that patient is being transported (per her request to receive call) so she can meet him at facility.

## 2018-02-21 DIAGNOSIS — E7849 Other hyperlipidemia: Secondary | ICD-10-CM | POA: Diagnosis not present

## 2018-02-21 DIAGNOSIS — N184 Chronic kidney disease, stage 4 (severe): Secondary | ICD-10-CM | POA: Diagnosis not present

## 2018-02-21 DIAGNOSIS — I480 Paroxysmal atrial fibrillation: Secondary | ICD-10-CM | POA: Diagnosis not present

## 2018-02-21 DIAGNOSIS — I21A9 Other myocardial infarction type: Secondary | ICD-10-CM | POA: Diagnosis not present

## 2018-02-21 NOTE — Consult Note (Signed)
            [facility] CM Primary Care Navigator  02/21/2018  [name] [birth year] [id number]   Attempt to see patientat the bedsideto identify possible discharge needs but he wasalreadydischarge perstaff.  Patientwas discharged to skilled nursing facility per therapy recommendation (SNF- [facility]) and needing Palliative care follow up at SNF per MD.  Per chart review,patient had presented with multiple falls and dizziness and was treated for a NSTEMI-non-ST elevation MI, paroxysmal atrial fibrillation, falls and dizzines.  Per MD note, patient with multiple medical problems and felt that he does have a 3-month or less life expectancy.  Palliative care consulted.  Primary care provider's office is listed as providing transition of care (TOC) follow-up.  Patient has discharge instruction to follow-up withprimary care provider in1- 2 weeks.  For additional questions please contact:  Edwena Felty A. Clairissa Valvano, BSN, RN-BC [facility] PRIMARY CARE Navigator Cell: [phone]

## 2018-03-29 DIAGNOSIS — I21A9 Other myocardial infarction type: Secondary | ICD-10-CM | POA: Diagnosis not present

## 2018-03-29 DIAGNOSIS — E7849 Other hyperlipidemia: Secondary | ICD-10-CM | POA: Diagnosis not present

## 2018-03-29 DIAGNOSIS — N184 Chronic kidney disease, stage 4 (severe): Secondary | ICD-10-CM | POA: Diagnosis not present

## 2018-03-29 DIAGNOSIS — I480 Paroxysmal atrial fibrillation: Secondary | ICD-10-CM | POA: Diagnosis not present

## 2018-04-11 DIAGNOSIS — Z9181 History of falling: Secondary | ICD-10-CM | POA: Diagnosis not present

## 2018-04-11 DIAGNOSIS — I214 Non-ST elevation (NSTEMI) myocardial infarction: Secondary | ICD-10-CM | POA: Diagnosis not present

## 2018-04-11 DIAGNOSIS — N184 Chronic kidney disease, stage 4 (severe): Secondary | ICD-10-CM | POA: Diagnosis not present

## 2018-04-11 DIAGNOSIS — E1122 Type 2 diabetes mellitus with diabetic chronic kidney disease: Secondary | ICD-10-CM | POA: Diagnosis not present

## 2018-04-11 DIAGNOSIS — F419 Anxiety disorder, unspecified: Secondary | ICD-10-CM | POA: Diagnosis not present

## 2018-04-11 DIAGNOSIS — I480 Paroxysmal atrial fibrillation: Secondary | ICD-10-CM | POA: Diagnosis not present

## 2018-04-11 DIAGNOSIS — R2689 Other abnormalities of gait and mobility: Secondary | ICD-10-CM | POA: Diagnosis not present

## 2018-04-11 DIAGNOSIS — I2510 Atherosclerotic heart disease of native coronary artery without angina pectoris: Secondary | ICD-10-CM | POA: Diagnosis not present

## 2018-04-11 DIAGNOSIS — M6281 Muscle weakness (generalized): Secondary | ICD-10-CM | POA: Diagnosis not present

## 2018-04-17 ENCOUNTER — Telehealth: Payer: Self-pay

## 2018-04-17 NOTE — Telephone Encounter (Signed)
They will being starting to see patient this Saturday  04/19/18

## 2018-04-23 ENCOUNTER — Other Ambulatory Visit: Payer: Self-pay | Admitting: *Deleted

## 2018-04-23 ENCOUNTER — Ambulatory Visit: Payer: Medicare Other | Admitting: Family Medicine

## 2018-04-23 MED ORDER — MIRABEGRON ER 25 MG PO TB24: 25.0000 mg | ORAL_TABLET | Freq: Two times a day (BID) | ORAL | 0 refills | Status: DC

## 2018-04-23 MED ORDER — APIXABAN 2.5 MG PO TABS: 2.5000 mg | ORAL_TABLET | Freq: Two times a day (BID) | ORAL | 0 refills | Status: DC

## 2018-04-23 MED ORDER — LEVOTHYROXINE SODIUM 175 MCG PO TABS: ORAL_TABLET | ORAL | 0 refills | Status: DC

## 2018-04-23 NOTE — Telephone Encounter (Signed)
Pt had to cancel today's appt, came home from SNF last week too weak to come in Needs RFs on thyroid med, myrbetriq and eliquis Also hospital had suggested Hospice/Pallitive care, daughter wanted to do Rehab first, now that he is home he has not done so well Daughter & I have both spoken to [facility] they will be going out for evaluation to admit w/ Dx of heart disease with 6 mos or less

## 2018-04-24 DIAGNOSIS — R42 Dizziness and giddiness: Secondary | ICD-10-CM | POA: Diagnosis not present

## 2018-04-24 DIAGNOSIS — N183 Chronic kidney disease, stage 3 (moderate): Secondary | ICD-10-CM | POA: Diagnosis not present

## 2018-04-24 DIAGNOSIS — I482 Chronic atrial fibrillation, unspecified: Secondary | ICD-10-CM | POA: Diagnosis not present

## 2018-04-24 DIAGNOSIS — I10 Essential (primary) hypertension: Secondary | ICD-10-CM | POA: Diagnosis not present

## 2018-04-24 DIAGNOSIS — R0602 Shortness of breath: Secondary | ICD-10-CM | POA: Diagnosis not present

## 2018-04-24 DIAGNOSIS — E785 Hyperlipidemia, unspecified: Secondary | ICD-10-CM | POA: Diagnosis not present

## 2018-04-24 DIAGNOSIS — R531 Weakness: Secondary | ICD-10-CM | POA: Diagnosis not present

## 2018-04-24 DIAGNOSIS — I519 Heart disease, unspecified: Secondary | ICD-10-CM | POA: Diagnosis not present

## 2018-04-24 DIAGNOSIS — Z950 Presence of cardiac pacemaker: Secondary | ICD-10-CM | POA: Diagnosis not present

## 2018-04-24 DIAGNOSIS — E039 Hypothyroidism, unspecified: Secondary | ICD-10-CM | POA: Diagnosis not present

## 2018-04-25 DIAGNOSIS — I519 Heart disease, unspecified: Secondary | ICD-10-CM | POA: Diagnosis not present

## 2018-04-25 DIAGNOSIS — I10 Essential (primary) hypertension: Secondary | ICD-10-CM | POA: Diagnosis not present

## 2018-04-25 DIAGNOSIS — E039 Hypothyroidism, unspecified: Secondary | ICD-10-CM | POA: Diagnosis not present

## 2018-04-25 DIAGNOSIS — I482 Chronic atrial fibrillation, unspecified: Secondary | ICD-10-CM | POA: Diagnosis not present

## 2018-04-25 DIAGNOSIS — Z950 Presence of cardiac pacemaker: Secondary | ICD-10-CM | POA: Diagnosis not present

## 2018-04-25 DIAGNOSIS — E785 Hyperlipidemia, unspecified: Secondary | ICD-10-CM | POA: Diagnosis not present

## 2018-04-28 DIAGNOSIS — E039 Hypothyroidism, unspecified: Secondary | ICD-10-CM | POA: Diagnosis not present

## 2018-04-28 DIAGNOSIS — Z950 Presence of cardiac pacemaker: Secondary | ICD-10-CM | POA: Diagnosis not present

## 2018-04-28 DIAGNOSIS — I10 Essential (primary) hypertension: Secondary | ICD-10-CM | POA: Diagnosis not present

## 2018-04-28 DIAGNOSIS — I519 Heart disease, unspecified: Secondary | ICD-10-CM | POA: Diagnosis not present

## 2018-04-28 DIAGNOSIS — I482 Chronic atrial fibrillation, unspecified: Secondary | ICD-10-CM | POA: Diagnosis not present

## 2018-04-28 DIAGNOSIS — E785 Hyperlipidemia, unspecified: Secondary | ICD-10-CM | POA: Diagnosis not present

## 2018-04-30 ENCOUNTER — Other Ambulatory Visit: Payer: Self-pay | Admitting: *Deleted

## 2018-04-30 DIAGNOSIS — E039 Hypothyroidism, unspecified: Secondary | ICD-10-CM | POA: Diagnosis not present

## 2018-04-30 DIAGNOSIS — I10 Essential (primary) hypertension: Secondary | ICD-10-CM | POA: Diagnosis not present

## 2018-04-30 DIAGNOSIS — I482 Chronic atrial fibrillation, unspecified: Secondary | ICD-10-CM | POA: Diagnosis not present

## 2018-04-30 DIAGNOSIS — E785 Hyperlipidemia, unspecified: Secondary | ICD-10-CM | POA: Diagnosis not present

## 2018-04-30 DIAGNOSIS — Z950 Presence of cardiac pacemaker: Secondary | ICD-10-CM | POA: Diagnosis not present

## 2018-04-30 DIAGNOSIS — I519 Heart disease, unspecified: Secondary | ICD-10-CM | POA: Diagnosis not present

## 2018-04-30 NOTE — Telephone Encounter (Signed)
Fax from [facility] for refill for Hospice pt for [facility] cough syrup Paper fax form will be placed on providers desk for signature and to be faxed back

## 2018-05-01 DIAGNOSIS — I482 Chronic atrial fibrillation, unspecified: Secondary | ICD-10-CM | POA: Diagnosis not present

## 2018-05-01 DIAGNOSIS — I519 Heart disease, unspecified: Secondary | ICD-10-CM | POA: Diagnosis not present

## 2018-05-01 DIAGNOSIS — E039 Hypothyroidism, unspecified: Secondary | ICD-10-CM | POA: Diagnosis not present

## 2018-05-01 DIAGNOSIS — E785 Hyperlipidemia, unspecified: Secondary | ICD-10-CM | POA: Diagnosis not present

## 2018-05-01 DIAGNOSIS — Z950 Presence of cardiac pacemaker: Secondary | ICD-10-CM | POA: Diagnosis not present

## 2018-05-01 DIAGNOSIS — I10 Essential (primary) hypertension: Secondary | ICD-10-CM | POA: Diagnosis not present

## 2018-05-05 DIAGNOSIS — E039 Hypothyroidism, unspecified: Secondary | ICD-10-CM | POA: Diagnosis not present

## 2018-05-05 DIAGNOSIS — Z950 Presence of cardiac pacemaker: Secondary | ICD-10-CM | POA: Diagnosis not present

## 2018-05-05 DIAGNOSIS — I482 Chronic atrial fibrillation, unspecified: Secondary | ICD-10-CM | POA: Diagnosis not present

## 2018-05-05 DIAGNOSIS — I519 Heart disease, unspecified: Secondary | ICD-10-CM | POA: Diagnosis not present

## 2018-05-05 DIAGNOSIS — E785 Hyperlipidemia, unspecified: Secondary | ICD-10-CM | POA: Diagnosis not present

## 2018-05-05 DIAGNOSIS — I10 Essential (primary) hypertension: Secondary | ICD-10-CM | POA: Diagnosis not present

## 2018-05-07 ENCOUNTER — Telehealth: Payer: Self-pay | Admitting: Cardiology

## 2018-05-07 ENCOUNTER — Encounter

## 2018-05-07 NOTE — Telephone Encounter (Signed)
Confirmed remote transmission w/ pt daughter.   

## 2018-05-09 ENCOUNTER — Ambulatory Visit (INDEPENDENT_AMBULATORY_CARE_PROVIDER_SITE_OTHER)

## 2018-05-09 DIAGNOSIS — E039 Hypothyroidism, unspecified: Secondary | ICD-10-CM

## 2018-05-09 DIAGNOSIS — I482 Chronic atrial fibrillation, unspecified: Secondary | ICD-10-CM

## 2018-05-09 DIAGNOSIS — R0602 Shortness of breath: Secondary | ICD-10-CM

## 2018-05-09 DIAGNOSIS — N183 Chronic kidney disease, stage 3 (moderate): Secondary | ICD-10-CM

## 2018-05-09 DIAGNOSIS — I10 Essential (primary) hypertension: Secondary | ICD-10-CM

## 2018-05-09 DIAGNOSIS — Z950 Presence of cardiac pacemaker: Secondary | ICD-10-CM

## 2018-05-09 DIAGNOSIS — E785 Hyperlipidemia, unspecified: Secondary | ICD-10-CM

## 2018-05-09 DIAGNOSIS — I519 Heart disease, unspecified: Secondary | ICD-10-CM

## 2018-05-09 DIAGNOSIS — R42 Dizziness and giddiness: Secondary | ICD-10-CM

## 2018-05-09 DIAGNOSIS — R531 Weakness: Secondary | ICD-10-CM

## 2018-05-11 DIAGNOSIS — E039 Hypothyroidism, unspecified: Secondary | ICD-10-CM | POA: Diagnosis not present

## 2018-05-11 DIAGNOSIS — E785 Hyperlipidemia, unspecified: Secondary | ICD-10-CM | POA: Diagnosis not present

## 2018-05-11 DIAGNOSIS — R0602 Shortness of breath: Secondary | ICD-10-CM | POA: Diagnosis not present

## 2018-05-11 DIAGNOSIS — I482 Chronic atrial fibrillation, unspecified: Secondary | ICD-10-CM | POA: Diagnosis not present

## 2018-05-11 DIAGNOSIS — I10 Essential (primary) hypertension: Secondary | ICD-10-CM | POA: Diagnosis not present

## 2018-05-11 DIAGNOSIS — N183 Chronic kidney disease, stage 3 (moderate): Secondary | ICD-10-CM | POA: Diagnosis not present

## 2018-05-11 DIAGNOSIS — R531 Weakness: Secondary | ICD-10-CM | POA: Diagnosis not present

## 2018-05-11 DIAGNOSIS — R42 Dizziness and giddiness: Secondary | ICD-10-CM | POA: Diagnosis not present

## 2018-05-11 DIAGNOSIS — I519 Heart disease, unspecified: Secondary | ICD-10-CM | POA: Diagnosis not present

## 2018-05-11 DIAGNOSIS — Z950 Presence of cardiac pacemaker: Secondary | ICD-10-CM | POA: Diagnosis not present

## 2018-05-14 DIAGNOSIS — I482 Chronic atrial fibrillation, unspecified: Secondary | ICD-10-CM | POA: Diagnosis not present

## 2018-05-14 DIAGNOSIS — I519 Heart disease, unspecified: Secondary | ICD-10-CM | POA: Diagnosis not present

## 2018-05-14 DIAGNOSIS — E785 Hyperlipidemia, unspecified: Secondary | ICD-10-CM | POA: Diagnosis not present

## 2018-05-14 DIAGNOSIS — E039 Hypothyroidism, unspecified: Secondary | ICD-10-CM | POA: Diagnosis not present

## 2018-05-14 DIAGNOSIS — I10 Essential (primary) hypertension: Secondary | ICD-10-CM | POA: Diagnosis not present

## 2018-05-14 DIAGNOSIS — Z950 Presence of cardiac pacemaker: Secondary | ICD-10-CM | POA: Diagnosis not present

## 2018-05-15 ENCOUNTER — Encounter: Payer: Self-pay | Admitting: Cardiology

## 2018-05-20 ENCOUNTER — Telehealth: Payer: Self-pay | Admitting: *Deleted

## 2018-05-20 DIAGNOSIS — E785 Hyperlipidemia, unspecified: Secondary | ICD-10-CM | POA: Diagnosis not present

## 2018-05-20 DIAGNOSIS — I519 Heart disease, unspecified: Secondary | ICD-10-CM | POA: Diagnosis not present

## 2018-05-20 DIAGNOSIS — E039 Hypothyroidism, unspecified: Secondary | ICD-10-CM | POA: Diagnosis not present

## 2018-05-20 DIAGNOSIS — Z950 Presence of cardiac pacemaker: Secondary | ICD-10-CM | POA: Diagnosis not present

## 2018-05-20 DIAGNOSIS — I10 Essential (primary) hypertension: Secondary | ICD-10-CM | POA: Diagnosis not present

## 2018-05-20 DIAGNOSIS — I482 Chronic atrial fibrillation, unspecified: Secondary | ICD-10-CM | POA: Diagnosis not present

## 2018-05-20 MED ORDER — APIXABAN 2.5 MG PO TABS: 2.5000 mg | ORAL_TABLET | Freq: Two times a day (BID) | ORAL | 0 refills | Status: DC

## 2018-05-20 MED ORDER — MIRABEGRON ER 25 MG PO TB24: 25.0000 mg | ORAL_TABLET | Freq: Two times a day (BID) | ORAL | 0 refills | Status: DC

## 2018-05-20 NOTE — Telephone Encounter (Signed)
Refills sent to [facility] Appt made for 06/19/18

## 2018-05-23 DIAGNOSIS — I482 Chronic atrial fibrillation, unspecified: Secondary | ICD-10-CM | POA: Diagnosis not present

## 2018-05-23 DIAGNOSIS — I10 Essential (primary) hypertension: Secondary | ICD-10-CM | POA: Diagnosis not present

## 2018-05-23 DIAGNOSIS — E785 Hyperlipidemia, unspecified: Secondary | ICD-10-CM | POA: Diagnosis not present

## 2018-05-23 DIAGNOSIS — I519 Heart disease, unspecified: Secondary | ICD-10-CM | POA: Diagnosis not present

## 2018-05-23 DIAGNOSIS — Z950 Presence of cardiac pacemaker: Secondary | ICD-10-CM | POA: Diagnosis not present

## 2018-05-23 DIAGNOSIS — E039 Hypothyroidism, unspecified: Secondary | ICD-10-CM | POA: Diagnosis not present

## 2018-05-26 ENCOUNTER — Telehealth: Payer: Self-pay

## 2018-05-26 DIAGNOSIS — Z950 Presence of cardiac pacemaker: Secondary | ICD-10-CM | POA: Diagnosis not present

## 2018-05-26 DIAGNOSIS — E039 Hypothyroidism, unspecified: Secondary | ICD-10-CM | POA: Diagnosis not present

## 2018-05-26 DIAGNOSIS — I482 Chronic atrial fibrillation, unspecified: Secondary | ICD-10-CM | POA: Diagnosis not present

## 2018-05-26 DIAGNOSIS — I10 Essential (primary) hypertension: Secondary | ICD-10-CM | POA: Diagnosis not present

## 2018-05-26 DIAGNOSIS — I519 Heart disease, unspecified: Secondary | ICD-10-CM | POA: Diagnosis not present

## 2018-05-26 DIAGNOSIS — E785 Hyperlipidemia, unspecified: Secondary | ICD-10-CM | POA: Diagnosis not present

## 2018-05-26 MED ORDER — FUROSEMIDE 20 MG PO TABS: 20.0000 mg | ORAL_TABLET | Freq: Every day | ORAL | 3 refills | Status: DC | PRN

## 2018-05-26 NOTE — Telephone Encounter (Signed)
Hospice aware rx sent in.

## 2018-05-26 NOTE — Telephone Encounter (Signed)
Crackles Right lung  Can you send in Lasix PRN to [facility]?

## 2018-05-26 NOTE — Addendum Note (Signed)
Addended by: Caryl Pina on: 05/26/2018 01:04 PM   Modules accepted: Orders

## 2018-05-26 NOTE — Telephone Encounter (Signed)
I sent it to [facility] in [place]

## 2018-05-27 DIAGNOSIS — E039 Hypothyroidism, unspecified: Secondary | ICD-10-CM | POA: Diagnosis not present

## 2018-05-27 DIAGNOSIS — I519 Heart disease, unspecified: Secondary | ICD-10-CM | POA: Diagnosis not present

## 2018-05-27 DIAGNOSIS — Z950 Presence of cardiac pacemaker: Secondary | ICD-10-CM | POA: Diagnosis not present

## 2018-05-27 DIAGNOSIS — I482 Chronic atrial fibrillation, unspecified: Secondary | ICD-10-CM | POA: Diagnosis not present

## 2018-05-27 DIAGNOSIS — E785 Hyperlipidemia, unspecified: Secondary | ICD-10-CM | POA: Diagnosis not present

## 2018-05-27 DIAGNOSIS — I10 Essential (primary) hypertension: Secondary | ICD-10-CM | POA: Diagnosis not present

## 2018-05-30 DIAGNOSIS — E039 Hypothyroidism, unspecified: Secondary | ICD-10-CM | POA: Diagnosis not present

## 2018-05-30 DIAGNOSIS — I10 Essential (primary) hypertension: Secondary | ICD-10-CM | POA: Diagnosis not present

## 2018-05-30 DIAGNOSIS — I482 Chronic atrial fibrillation, unspecified: Secondary | ICD-10-CM | POA: Diagnosis not present

## 2018-05-30 DIAGNOSIS — Z950 Presence of cardiac pacemaker: Secondary | ICD-10-CM | POA: Diagnosis not present

## 2018-05-30 DIAGNOSIS — E785 Hyperlipidemia, unspecified: Secondary | ICD-10-CM | POA: Diagnosis not present

## 2018-05-30 DIAGNOSIS — I519 Heart disease, unspecified: Secondary | ICD-10-CM | POA: Diagnosis not present

## 2018-06-05 DIAGNOSIS — I10 Essential (primary) hypertension: Secondary | ICD-10-CM | POA: Diagnosis not present

## 2018-06-05 DIAGNOSIS — E785 Hyperlipidemia, unspecified: Secondary | ICD-10-CM | POA: Diagnosis not present

## 2018-06-05 DIAGNOSIS — I519 Heart disease, unspecified: Secondary | ICD-10-CM | POA: Diagnosis not present

## 2018-06-05 DIAGNOSIS — Z950 Presence of cardiac pacemaker: Secondary | ICD-10-CM | POA: Diagnosis not present

## 2018-06-05 DIAGNOSIS — I482 Chronic atrial fibrillation, unspecified: Secondary | ICD-10-CM | POA: Diagnosis not present

## 2018-06-05 DIAGNOSIS — E039 Hypothyroidism, unspecified: Secondary | ICD-10-CM | POA: Diagnosis not present

## 2018-06-09 DIAGNOSIS — Z950 Presence of cardiac pacemaker: Secondary | ICD-10-CM | POA: Diagnosis not present

## 2018-06-09 DIAGNOSIS — I10 Essential (primary) hypertension: Secondary | ICD-10-CM | POA: Diagnosis not present

## 2018-06-09 DIAGNOSIS — I519 Heart disease, unspecified: Secondary | ICD-10-CM | POA: Diagnosis not present

## 2018-06-09 DIAGNOSIS — E785 Hyperlipidemia, unspecified: Secondary | ICD-10-CM | POA: Diagnosis not present

## 2018-06-09 DIAGNOSIS — E039 Hypothyroidism, unspecified: Secondary | ICD-10-CM | POA: Diagnosis not present

## 2018-06-09 DIAGNOSIS — I482 Chronic atrial fibrillation, unspecified: Secondary | ICD-10-CM | POA: Diagnosis not present

## 2018-06-11 DIAGNOSIS — R0602 Shortness of breath: Secondary | ICD-10-CM | POA: Diagnosis not present

## 2018-06-11 DIAGNOSIS — E785 Hyperlipidemia, unspecified: Secondary | ICD-10-CM | POA: Diagnosis not present

## 2018-06-11 DIAGNOSIS — I519 Heart disease, unspecified: Secondary | ICD-10-CM | POA: Diagnosis not present

## 2018-06-11 DIAGNOSIS — Z950 Presence of cardiac pacemaker: Secondary | ICD-10-CM | POA: Diagnosis not present

## 2018-06-11 DIAGNOSIS — N183 Chronic kidney disease, stage 3 (moderate): Secondary | ICD-10-CM | POA: Diagnosis not present

## 2018-06-11 DIAGNOSIS — E039 Hypothyroidism, unspecified: Secondary | ICD-10-CM | POA: Diagnosis not present

## 2018-06-11 DIAGNOSIS — R42 Dizziness and giddiness: Secondary | ICD-10-CM | POA: Diagnosis not present

## 2018-06-11 DIAGNOSIS — I482 Chronic atrial fibrillation, unspecified: Secondary | ICD-10-CM | POA: Diagnosis not present

## 2018-06-11 DIAGNOSIS — R531 Weakness: Secondary | ICD-10-CM | POA: Diagnosis not present

## 2018-06-11 DIAGNOSIS — I10 Essential (primary) hypertension: Secondary | ICD-10-CM | POA: Diagnosis not present

## 2018-06-16 DIAGNOSIS — I519 Heart disease, unspecified: Secondary | ICD-10-CM | POA: Diagnosis not present

## 2018-06-16 DIAGNOSIS — I482 Chronic atrial fibrillation, unspecified: Secondary | ICD-10-CM | POA: Diagnosis not present

## 2018-06-16 DIAGNOSIS — I10 Essential (primary) hypertension: Secondary | ICD-10-CM | POA: Diagnosis not present

## 2018-06-16 DIAGNOSIS — Z950 Presence of cardiac pacemaker: Secondary | ICD-10-CM | POA: Diagnosis not present

## 2018-06-16 DIAGNOSIS — E785 Hyperlipidemia, unspecified: Secondary | ICD-10-CM | POA: Diagnosis not present

## 2018-06-16 DIAGNOSIS — E039 Hypothyroidism, unspecified: Secondary | ICD-10-CM | POA: Diagnosis not present

## 2018-06-19 ENCOUNTER — Ambulatory Visit: Admitting: Family Medicine

## 2018-06-20 DIAGNOSIS — E785 Hyperlipidemia, unspecified: Secondary | ICD-10-CM | POA: Diagnosis not present

## 2018-06-20 DIAGNOSIS — I10 Essential (primary) hypertension: Secondary | ICD-10-CM | POA: Diagnosis not present

## 2018-06-20 DIAGNOSIS — Z950 Presence of cardiac pacemaker: Secondary | ICD-10-CM | POA: Diagnosis not present

## 2018-06-20 DIAGNOSIS — E039 Hypothyroidism, unspecified: Secondary | ICD-10-CM | POA: Diagnosis not present

## 2018-06-20 DIAGNOSIS — I482 Chronic atrial fibrillation, unspecified: Secondary | ICD-10-CM | POA: Diagnosis not present

## 2018-06-20 DIAGNOSIS — I519 Heart disease, unspecified: Secondary | ICD-10-CM | POA: Diagnosis not present

## 2018-06-24 ENCOUNTER — Ambulatory Visit (INDEPENDENT_AMBULATORY_CARE_PROVIDER_SITE_OTHER)

## 2018-06-24 DIAGNOSIS — I519 Heart disease, unspecified: Secondary | ICD-10-CM | POA: Diagnosis not present

## 2018-06-24 DIAGNOSIS — I482 Chronic atrial fibrillation, unspecified: Secondary | ICD-10-CM | POA: Diagnosis not present

## 2018-06-24 DIAGNOSIS — E785 Hyperlipidemia, unspecified: Secondary | ICD-10-CM | POA: Diagnosis not present

## 2018-06-24 DIAGNOSIS — I442 Atrioventricular block, complete: Secondary | ICD-10-CM

## 2018-06-24 DIAGNOSIS — E039 Hypothyroidism, unspecified: Secondary | ICD-10-CM | POA: Diagnosis not present

## 2018-06-24 DIAGNOSIS — Z950 Presence of cardiac pacemaker: Secondary | ICD-10-CM | POA: Diagnosis not present

## 2018-06-24 DIAGNOSIS — I10 Essential (primary) hypertension: Secondary | ICD-10-CM | POA: Diagnosis not present

## 2018-06-25 ENCOUNTER — Ambulatory Visit

## 2018-06-25 DIAGNOSIS — I442 Atrioventricular block, complete: Secondary | ICD-10-CM

## 2018-06-25 NOTE — Progress Notes (Signed)
Remote pacemaker transmission.   

## 2018-06-26 DIAGNOSIS — Z950 Presence of cardiac pacemaker: Secondary | ICD-10-CM | POA: Diagnosis not present

## 2018-06-26 DIAGNOSIS — I10 Essential (primary) hypertension: Secondary | ICD-10-CM | POA: Diagnosis not present

## 2018-06-26 DIAGNOSIS — I482 Chronic atrial fibrillation, unspecified: Secondary | ICD-10-CM | POA: Diagnosis not present

## 2018-06-26 DIAGNOSIS — E785 Hyperlipidemia, unspecified: Secondary | ICD-10-CM | POA: Diagnosis not present

## 2018-06-26 DIAGNOSIS — I519 Heart disease, unspecified: Secondary | ICD-10-CM | POA: Diagnosis not present

## 2018-06-26 DIAGNOSIS — E039 Hypothyroidism, unspecified: Secondary | ICD-10-CM | POA: Diagnosis not present

## 2018-06-26 NOTE — Progress Notes (Signed)
Remote pacemaker transmission.   

## 2018-06-27 DIAGNOSIS — Z950 Presence of cardiac pacemaker: Secondary | ICD-10-CM | POA: Diagnosis not present

## 2018-06-27 DIAGNOSIS — I482 Chronic atrial fibrillation, unspecified: Secondary | ICD-10-CM | POA: Diagnosis not present

## 2018-06-27 DIAGNOSIS — E785 Hyperlipidemia, unspecified: Secondary | ICD-10-CM | POA: Diagnosis not present

## 2018-06-27 DIAGNOSIS — I519 Heart disease, unspecified: Secondary | ICD-10-CM | POA: Diagnosis not present

## 2018-06-27 DIAGNOSIS — I10 Essential (primary) hypertension: Secondary | ICD-10-CM | POA: Diagnosis not present

## 2018-06-27 DIAGNOSIS — E039 Hypothyroidism, unspecified: Secondary | ICD-10-CM | POA: Diagnosis not present

## 2018-06-27 NOTE — Progress Notes (Signed)
Charged in error

## 2018-06-28 LAB — CUP PACEART REMOTE DEVICE CHECK
Battery Remaining Percentage: 95.5 %
Battery Voltage: 3.01 V
Brady Statistic RV Percent Paced: 99 %
Implantable Lead Implant Date: 20090127
Implantable Lead Implant Date: 20090127
Implantable Lead Location: 753859
Lead Channel Setting Pacing Amplitude: 2.5 V
Lead Channel Setting Sensing Sensitivity: 2.5 mV
MDC IDC LEAD LOCATION: 753860
MDC IDC MSMT BATTERY REMAINING LONGEVITY: 103 mo
MDC IDC MSMT LEADCHNL RV IMPEDANCE VALUE: 380 Ohm
MDC IDC MSMT LEADCHNL RV PACING THRESHOLD AMPLITUDE: 1 V
MDC IDC MSMT LEADCHNL RV PACING THRESHOLD PULSEWIDTH: 0.5 ms
MDC IDC MSMT LEADCHNL RV SENSING INTR AMPL: 7.5 mV
MDC IDC PG IMPLANT DT: 20190426
MDC IDC SESS DTM: 20200114082843
MDC IDC SET LEADCHNL RV PACING PULSEWIDTH: 0.5 ms
Pulse Gen Model: 2272
Pulse Gen Serial Number: 9019173

## 2018-07-01 DIAGNOSIS — E039 Hypothyroidism, unspecified: Secondary | ICD-10-CM | POA: Diagnosis not present

## 2018-07-01 DIAGNOSIS — Z950 Presence of cardiac pacemaker: Secondary | ICD-10-CM | POA: Diagnosis not present

## 2018-07-01 DIAGNOSIS — I482 Chronic atrial fibrillation, unspecified: Secondary | ICD-10-CM | POA: Diagnosis not present

## 2018-07-01 DIAGNOSIS — I10 Essential (primary) hypertension: Secondary | ICD-10-CM | POA: Diagnosis not present

## 2018-07-01 DIAGNOSIS — I519 Heart disease, unspecified: Secondary | ICD-10-CM | POA: Diagnosis not present

## 2018-07-01 DIAGNOSIS — E785 Hyperlipidemia, unspecified: Secondary | ICD-10-CM | POA: Diagnosis not present

## 2018-07-03 DIAGNOSIS — E039 Hypothyroidism, unspecified: Secondary | ICD-10-CM | POA: Diagnosis not present

## 2018-07-03 DIAGNOSIS — Z950 Presence of cardiac pacemaker: Secondary | ICD-10-CM | POA: Diagnosis not present

## 2018-07-03 DIAGNOSIS — I519 Heart disease, unspecified: Secondary | ICD-10-CM | POA: Diagnosis not present

## 2018-07-03 DIAGNOSIS — I10 Essential (primary) hypertension: Secondary | ICD-10-CM | POA: Diagnosis not present

## 2018-07-03 DIAGNOSIS — I482 Chronic atrial fibrillation, unspecified: Secondary | ICD-10-CM | POA: Diagnosis not present

## 2018-07-03 DIAGNOSIS — E785 Hyperlipidemia, unspecified: Secondary | ICD-10-CM | POA: Diagnosis not present

## 2018-07-08 DIAGNOSIS — I10 Essential (primary) hypertension: Secondary | ICD-10-CM | POA: Diagnosis not present

## 2018-07-08 DIAGNOSIS — I482 Chronic atrial fibrillation, unspecified: Secondary | ICD-10-CM | POA: Diagnosis not present

## 2018-07-08 DIAGNOSIS — Z950 Presence of cardiac pacemaker: Secondary | ICD-10-CM | POA: Diagnosis not present

## 2018-07-08 DIAGNOSIS — I519 Heart disease, unspecified: Secondary | ICD-10-CM | POA: Diagnosis not present

## 2018-07-08 DIAGNOSIS — E785 Hyperlipidemia, unspecified: Secondary | ICD-10-CM | POA: Diagnosis not present

## 2018-07-08 DIAGNOSIS — E039 Hypothyroidism, unspecified: Secondary | ICD-10-CM | POA: Diagnosis not present

## 2018-07-10 DIAGNOSIS — E785 Hyperlipidemia, unspecified: Secondary | ICD-10-CM | POA: Diagnosis not present

## 2018-07-10 DIAGNOSIS — Z950 Presence of cardiac pacemaker: Secondary | ICD-10-CM | POA: Diagnosis not present

## 2018-07-10 DIAGNOSIS — I10 Essential (primary) hypertension: Secondary | ICD-10-CM | POA: Diagnosis not present

## 2018-07-10 DIAGNOSIS — I519 Heart disease, unspecified: Secondary | ICD-10-CM | POA: Diagnosis not present

## 2018-07-10 DIAGNOSIS — I482 Chronic atrial fibrillation, unspecified: Secondary | ICD-10-CM | POA: Diagnosis not present

## 2018-07-10 DIAGNOSIS — E039 Hypothyroidism, unspecified: Secondary | ICD-10-CM | POA: Diagnosis not present

## 2018-07-12 DIAGNOSIS — Z950 Presence of cardiac pacemaker: Secondary | ICD-10-CM | POA: Diagnosis not present

## 2018-07-12 DIAGNOSIS — I519 Heart disease, unspecified: Secondary | ICD-10-CM | POA: Diagnosis not present

## 2018-07-12 DIAGNOSIS — N183 Chronic kidney disease, stage 3 (moderate): Secondary | ICD-10-CM | POA: Diagnosis not present

## 2018-07-12 DIAGNOSIS — I482 Chronic atrial fibrillation, unspecified: Secondary | ICD-10-CM | POA: Diagnosis not present

## 2018-07-12 DIAGNOSIS — E785 Hyperlipidemia, unspecified: Secondary | ICD-10-CM | POA: Diagnosis not present

## 2018-07-12 DIAGNOSIS — R42 Dizziness and giddiness: Secondary | ICD-10-CM | POA: Diagnosis not present

## 2018-07-12 DIAGNOSIS — I10 Essential (primary) hypertension: Secondary | ICD-10-CM | POA: Diagnosis not present

## 2018-07-12 DIAGNOSIS — R0602 Shortness of breath: Secondary | ICD-10-CM | POA: Diagnosis not present

## 2018-07-12 DIAGNOSIS — R531 Weakness: Secondary | ICD-10-CM | POA: Diagnosis not present

## 2018-07-12 DIAGNOSIS — E039 Hypothyroidism, unspecified: Secondary | ICD-10-CM | POA: Diagnosis not present

## 2018-07-15 DIAGNOSIS — Z950 Presence of cardiac pacemaker: Secondary | ICD-10-CM | POA: Diagnosis not present

## 2018-07-15 DIAGNOSIS — E039 Hypothyroidism, unspecified: Secondary | ICD-10-CM | POA: Diagnosis not present

## 2018-07-15 DIAGNOSIS — I10 Essential (primary) hypertension: Secondary | ICD-10-CM | POA: Diagnosis not present

## 2018-07-15 DIAGNOSIS — E785 Hyperlipidemia, unspecified: Secondary | ICD-10-CM | POA: Diagnosis not present

## 2018-07-15 DIAGNOSIS — I482 Chronic atrial fibrillation, unspecified: Secondary | ICD-10-CM | POA: Diagnosis not present

## 2018-07-15 DIAGNOSIS — I519 Heart disease, unspecified: Secondary | ICD-10-CM | POA: Diagnosis not present

## 2018-07-17 DIAGNOSIS — Z950 Presence of cardiac pacemaker: Secondary | ICD-10-CM | POA: Diagnosis not present

## 2018-07-17 DIAGNOSIS — I519 Heart disease, unspecified: Secondary | ICD-10-CM | POA: Diagnosis not present

## 2018-07-17 DIAGNOSIS — E785 Hyperlipidemia, unspecified: Secondary | ICD-10-CM | POA: Diagnosis not present

## 2018-07-17 DIAGNOSIS — E039 Hypothyroidism, unspecified: Secondary | ICD-10-CM | POA: Diagnosis not present

## 2018-07-17 DIAGNOSIS — I10 Essential (primary) hypertension: Secondary | ICD-10-CM | POA: Diagnosis not present

## 2018-07-17 DIAGNOSIS — I482 Chronic atrial fibrillation, unspecified: Secondary | ICD-10-CM | POA: Diagnosis not present

## 2018-07-22 DIAGNOSIS — E785 Hyperlipidemia, unspecified: Secondary | ICD-10-CM | POA: Diagnosis not present

## 2018-07-22 DIAGNOSIS — E039 Hypothyroidism, unspecified: Secondary | ICD-10-CM | POA: Diagnosis not present

## 2018-07-22 DIAGNOSIS — I482 Chronic atrial fibrillation, unspecified: Secondary | ICD-10-CM | POA: Diagnosis not present

## 2018-07-22 DIAGNOSIS — I519 Heart disease, unspecified: Secondary | ICD-10-CM | POA: Diagnosis not present

## 2018-07-22 DIAGNOSIS — Z950 Presence of cardiac pacemaker: Secondary | ICD-10-CM | POA: Diagnosis not present

## 2018-07-22 DIAGNOSIS — I10 Essential (primary) hypertension: Secondary | ICD-10-CM | POA: Diagnosis not present

## 2018-07-24 DIAGNOSIS — I10 Essential (primary) hypertension: Secondary | ICD-10-CM | POA: Diagnosis not present

## 2018-07-24 DIAGNOSIS — I519 Heart disease, unspecified: Secondary | ICD-10-CM | POA: Diagnosis not present

## 2018-07-24 DIAGNOSIS — E785 Hyperlipidemia, unspecified: Secondary | ICD-10-CM | POA: Diagnosis not present

## 2018-07-24 DIAGNOSIS — I482 Chronic atrial fibrillation, unspecified: Secondary | ICD-10-CM | POA: Diagnosis not present

## 2018-07-24 DIAGNOSIS — E039 Hypothyroidism, unspecified: Secondary | ICD-10-CM | POA: Diagnosis not present

## 2018-07-24 DIAGNOSIS — Z950 Presence of cardiac pacemaker: Secondary | ICD-10-CM | POA: Diagnosis not present

## 2018-07-28 ENCOUNTER — Telehealth: Payer: Self-pay | Admitting: Family Medicine

## 2018-07-28 DIAGNOSIS — I519 Heart disease, unspecified: Secondary | ICD-10-CM | POA: Diagnosis not present

## 2018-07-28 DIAGNOSIS — E039 Hypothyroidism, unspecified: Secondary | ICD-10-CM | POA: Diagnosis not present

## 2018-07-28 DIAGNOSIS — E785 Hyperlipidemia, unspecified: Secondary | ICD-10-CM | POA: Diagnosis not present

## 2018-07-28 DIAGNOSIS — Z950 Presence of cardiac pacemaker: Secondary | ICD-10-CM | POA: Diagnosis not present

## 2018-07-28 DIAGNOSIS — I482 Chronic atrial fibrillation, unspecified: Secondary | ICD-10-CM | POA: Diagnosis not present

## 2018-07-28 DIAGNOSIS — I10 Essential (primary) hypertension: Secondary | ICD-10-CM | POA: Diagnosis not present

## 2018-07-28 MED ORDER — GLUCOSE BLOOD VI STRP: ORAL_STRIP | 3 refills | Status: DC

## 2018-07-28 NOTE — Telephone Encounter (Signed)
Refill sent.  Daughter aware patient will need to be seen in office for follow up

## 2018-07-31 DIAGNOSIS — E785 Hyperlipidemia, unspecified: Secondary | ICD-10-CM | POA: Diagnosis not present

## 2018-07-31 DIAGNOSIS — Z950 Presence of cardiac pacemaker: Secondary | ICD-10-CM | POA: Diagnosis not present

## 2018-07-31 DIAGNOSIS — E039 Hypothyroidism, unspecified: Secondary | ICD-10-CM | POA: Diagnosis not present

## 2018-07-31 DIAGNOSIS — I482 Chronic atrial fibrillation, unspecified: Secondary | ICD-10-CM | POA: Diagnosis not present

## 2018-07-31 DIAGNOSIS — I10 Essential (primary) hypertension: Secondary | ICD-10-CM | POA: Diagnosis not present

## 2018-07-31 DIAGNOSIS — I519 Heart disease, unspecified: Secondary | ICD-10-CM | POA: Diagnosis not present

## 2018-08-04 DIAGNOSIS — I10 Essential (primary) hypertension: Secondary | ICD-10-CM | POA: Diagnosis not present

## 2018-08-04 DIAGNOSIS — E785 Hyperlipidemia, unspecified: Secondary | ICD-10-CM | POA: Diagnosis not present

## 2018-08-04 DIAGNOSIS — E039 Hypothyroidism, unspecified: Secondary | ICD-10-CM | POA: Diagnosis not present

## 2018-08-04 DIAGNOSIS — Z950 Presence of cardiac pacemaker: Secondary | ICD-10-CM | POA: Diagnosis not present

## 2018-08-04 DIAGNOSIS — I519 Heart disease, unspecified: Secondary | ICD-10-CM | POA: Diagnosis not present

## 2018-08-04 DIAGNOSIS — I482 Chronic atrial fibrillation, unspecified: Secondary | ICD-10-CM | POA: Diagnosis not present

## 2018-08-05 ENCOUNTER — Other Ambulatory Visit: Payer: Self-pay | Admitting: Family Medicine

## 2018-08-05 ENCOUNTER — Telehealth: Payer: Self-pay | Admitting: Family Medicine

## 2018-08-05 MED ORDER — GLUCOSE BLOOD VI STRP: ORAL_STRIP | 3 refills | Status: AC

## 2018-08-05 NOTE — Telephone Encounter (Signed)
Test Strips sent to [facility]

## 2018-08-06 NOTE — Telephone Encounter (Signed)
Last seen 09/16/17  Dr Wendi Snipes

## 2018-08-06 NOTE — Telephone Encounter (Signed)
I will send a refill for these for 1 month but patient needs to come in for a checkup

## 2018-08-10 DIAGNOSIS — E039 Hypothyroidism, unspecified: Secondary | ICD-10-CM | POA: Diagnosis not present

## 2018-08-10 DIAGNOSIS — Z950 Presence of cardiac pacemaker: Secondary | ICD-10-CM | POA: Diagnosis not present

## 2018-08-10 DIAGNOSIS — I10 Essential (primary) hypertension: Secondary | ICD-10-CM | POA: Diagnosis not present

## 2018-08-10 DIAGNOSIS — I482 Chronic atrial fibrillation, unspecified: Secondary | ICD-10-CM | POA: Diagnosis not present

## 2018-08-10 DIAGNOSIS — R42 Dizziness and giddiness: Secondary | ICD-10-CM | POA: Diagnosis not present

## 2018-08-10 DIAGNOSIS — E785 Hyperlipidemia, unspecified: Secondary | ICD-10-CM | POA: Diagnosis not present

## 2018-08-10 DIAGNOSIS — R531 Weakness: Secondary | ICD-10-CM | POA: Diagnosis not present

## 2018-08-10 DIAGNOSIS — I519 Heart disease, unspecified: Secondary | ICD-10-CM | POA: Diagnosis not present

## 2018-08-10 DIAGNOSIS — R0602 Shortness of breath: Secondary | ICD-10-CM | POA: Diagnosis not present

## 2018-08-10 DIAGNOSIS — N183 Chronic kidney disease, stage 3 (moderate): Secondary | ICD-10-CM | POA: Diagnosis not present

## 2018-08-11 DIAGNOSIS — E039 Hypothyroidism, unspecified: Secondary | ICD-10-CM | POA: Diagnosis not present

## 2018-08-11 DIAGNOSIS — Z950 Presence of cardiac pacemaker: Secondary | ICD-10-CM | POA: Diagnosis not present

## 2018-08-11 DIAGNOSIS — E785 Hyperlipidemia, unspecified: Secondary | ICD-10-CM | POA: Diagnosis not present

## 2018-08-11 DIAGNOSIS — I519 Heart disease, unspecified: Secondary | ICD-10-CM | POA: Diagnosis not present

## 2018-08-11 DIAGNOSIS — I10 Essential (primary) hypertension: Secondary | ICD-10-CM | POA: Diagnosis not present

## 2018-08-11 DIAGNOSIS — I482 Chronic atrial fibrillation, unspecified: Secondary | ICD-10-CM | POA: Diagnosis not present

## 2018-08-19 DIAGNOSIS — I10 Essential (primary) hypertension: Secondary | ICD-10-CM | POA: Diagnosis not present

## 2018-08-19 DIAGNOSIS — I519 Heart disease, unspecified: Secondary | ICD-10-CM | POA: Diagnosis not present

## 2018-08-19 DIAGNOSIS — Z950 Presence of cardiac pacemaker: Secondary | ICD-10-CM | POA: Diagnosis not present

## 2018-08-19 DIAGNOSIS — E785 Hyperlipidemia, unspecified: Secondary | ICD-10-CM | POA: Diagnosis not present

## 2018-08-19 DIAGNOSIS — E039 Hypothyroidism, unspecified: Secondary | ICD-10-CM | POA: Diagnosis not present

## 2018-08-19 DIAGNOSIS — I482 Chronic atrial fibrillation, unspecified: Secondary | ICD-10-CM | POA: Diagnosis not present

## 2018-08-21 DIAGNOSIS — E785 Hyperlipidemia, unspecified: Secondary | ICD-10-CM | POA: Diagnosis not present

## 2018-08-21 DIAGNOSIS — Z950 Presence of cardiac pacemaker: Secondary | ICD-10-CM | POA: Diagnosis not present

## 2018-08-21 DIAGNOSIS — I482 Chronic atrial fibrillation, unspecified: Secondary | ICD-10-CM | POA: Diagnosis not present

## 2018-08-21 DIAGNOSIS — I10 Essential (primary) hypertension: Secondary | ICD-10-CM | POA: Diagnosis not present

## 2018-08-21 DIAGNOSIS — E039 Hypothyroidism, unspecified: Secondary | ICD-10-CM | POA: Diagnosis not present

## 2018-08-21 DIAGNOSIS — I519 Heart disease, unspecified: Secondary | ICD-10-CM | POA: Diagnosis not present

## 2018-08-22 DIAGNOSIS — I482 Chronic atrial fibrillation, unspecified: Secondary | ICD-10-CM | POA: Diagnosis not present

## 2018-08-22 DIAGNOSIS — I519 Heart disease, unspecified: Secondary | ICD-10-CM | POA: Diagnosis not present

## 2018-08-22 DIAGNOSIS — Z950 Presence of cardiac pacemaker: Secondary | ICD-10-CM | POA: Diagnosis not present

## 2018-08-22 DIAGNOSIS — E039 Hypothyroidism, unspecified: Secondary | ICD-10-CM | POA: Diagnosis not present

## 2018-08-22 DIAGNOSIS — I10 Essential (primary) hypertension: Secondary | ICD-10-CM | POA: Diagnosis not present

## 2018-08-22 DIAGNOSIS — E785 Hyperlipidemia, unspecified: Secondary | ICD-10-CM | POA: Diagnosis not present

## 2018-08-26 DIAGNOSIS — I519 Heart disease, unspecified: Secondary | ICD-10-CM | POA: Diagnosis not present

## 2018-08-26 DIAGNOSIS — I10 Essential (primary) hypertension: Secondary | ICD-10-CM | POA: Diagnosis not present

## 2018-08-26 DIAGNOSIS — Z950 Presence of cardiac pacemaker: Secondary | ICD-10-CM | POA: Diagnosis not present

## 2018-08-26 DIAGNOSIS — I482 Chronic atrial fibrillation, unspecified: Secondary | ICD-10-CM | POA: Diagnosis not present

## 2018-08-26 DIAGNOSIS — E039 Hypothyroidism, unspecified: Secondary | ICD-10-CM | POA: Diagnosis not present

## 2018-08-26 DIAGNOSIS — E785 Hyperlipidemia, unspecified: Secondary | ICD-10-CM | POA: Diagnosis not present

## 2018-08-28 DIAGNOSIS — I10 Essential (primary) hypertension: Secondary | ICD-10-CM | POA: Diagnosis not present

## 2018-08-28 DIAGNOSIS — E785 Hyperlipidemia, unspecified: Secondary | ICD-10-CM | POA: Diagnosis not present

## 2018-08-28 DIAGNOSIS — E039 Hypothyroidism, unspecified: Secondary | ICD-10-CM | POA: Diagnosis not present

## 2018-08-28 DIAGNOSIS — Z950 Presence of cardiac pacemaker: Secondary | ICD-10-CM | POA: Diagnosis not present

## 2018-08-28 DIAGNOSIS — I482 Chronic atrial fibrillation, unspecified: Secondary | ICD-10-CM | POA: Diagnosis not present

## 2018-08-28 DIAGNOSIS — I519 Heart disease, unspecified: Secondary | ICD-10-CM | POA: Diagnosis not present

## 2018-09-01 DIAGNOSIS — Z950 Presence of cardiac pacemaker: Secondary | ICD-10-CM | POA: Diagnosis not present

## 2018-09-01 DIAGNOSIS — I10 Essential (primary) hypertension: Secondary | ICD-10-CM | POA: Diagnosis not present

## 2018-09-01 DIAGNOSIS — I482 Chronic atrial fibrillation, unspecified: Secondary | ICD-10-CM | POA: Diagnosis not present

## 2018-09-01 DIAGNOSIS — I519 Heart disease, unspecified: Secondary | ICD-10-CM | POA: Diagnosis not present

## 2018-09-01 DIAGNOSIS — E785 Hyperlipidemia, unspecified: Secondary | ICD-10-CM | POA: Diagnosis not present

## 2018-09-01 DIAGNOSIS — E039 Hypothyroidism, unspecified: Secondary | ICD-10-CM | POA: Diagnosis not present

## 2018-09-09 ENCOUNTER — Other Ambulatory Visit: Payer: Self-pay

## 2018-09-09 ENCOUNTER — Ambulatory Visit (INDEPENDENT_AMBULATORY_CARE_PROVIDER_SITE_OTHER): Payer: Medicare Other

## 2018-09-09 DIAGNOSIS — I482 Chronic atrial fibrillation, unspecified: Secondary | ICD-10-CM | POA: Diagnosis not present

## 2018-09-09 DIAGNOSIS — I10 Essential (primary) hypertension: Secondary | ICD-10-CM | POA: Diagnosis not present

## 2018-09-09 DIAGNOSIS — R0602 Shortness of breath: Secondary | ICD-10-CM

## 2018-09-09 DIAGNOSIS — R531 Weakness: Secondary | ICD-10-CM

## 2018-09-09 DIAGNOSIS — E785 Hyperlipidemia, unspecified: Secondary | ICD-10-CM

## 2018-09-09 DIAGNOSIS — E039 Hypothyroidism, unspecified: Secondary | ICD-10-CM

## 2018-09-09 DIAGNOSIS — I519 Heart disease, unspecified: Secondary | ICD-10-CM | POA: Diagnosis not present

## 2018-09-09 DIAGNOSIS — Z950 Presence of cardiac pacemaker: Secondary | ICD-10-CM

## 2018-09-09 DIAGNOSIS — N183 Chronic kidney disease, stage 3 (moderate): Secondary | ICD-10-CM

## 2018-09-09 DIAGNOSIS — R42 Dizziness and giddiness: Secondary | ICD-10-CM

## 2018-09-10 DIAGNOSIS — I482 Chronic atrial fibrillation, unspecified: Secondary | ICD-10-CM | POA: Diagnosis not present

## 2018-09-10 DIAGNOSIS — R0602 Shortness of breath: Secondary | ICD-10-CM | POA: Diagnosis not present

## 2018-09-10 DIAGNOSIS — E785 Hyperlipidemia, unspecified: Secondary | ICD-10-CM | POA: Diagnosis not present

## 2018-09-10 DIAGNOSIS — N183 Chronic kidney disease, stage 3 (moderate): Secondary | ICD-10-CM | POA: Diagnosis not present

## 2018-09-10 DIAGNOSIS — Z950 Presence of cardiac pacemaker: Secondary | ICD-10-CM | POA: Diagnosis not present

## 2018-09-10 DIAGNOSIS — R42 Dizziness and giddiness: Secondary | ICD-10-CM | POA: Diagnosis not present

## 2018-09-10 DIAGNOSIS — R531 Weakness: Secondary | ICD-10-CM | POA: Diagnosis not present

## 2018-09-10 DIAGNOSIS — I10 Essential (primary) hypertension: Secondary | ICD-10-CM | POA: Diagnosis not present

## 2018-09-10 DIAGNOSIS — I519 Heart disease, unspecified: Secondary | ICD-10-CM | POA: Diagnosis not present

## 2018-09-10 DIAGNOSIS — E039 Hypothyroidism, unspecified: Secondary | ICD-10-CM | POA: Diagnosis not present

## 2018-09-12 DIAGNOSIS — I10 Essential (primary) hypertension: Secondary | ICD-10-CM | POA: Diagnosis not present

## 2018-09-12 DIAGNOSIS — E039 Hypothyroidism, unspecified: Secondary | ICD-10-CM | POA: Diagnosis not present

## 2018-09-12 DIAGNOSIS — Z950 Presence of cardiac pacemaker: Secondary | ICD-10-CM | POA: Diagnosis not present

## 2018-09-12 DIAGNOSIS — E785 Hyperlipidemia, unspecified: Secondary | ICD-10-CM | POA: Diagnosis not present

## 2018-09-12 DIAGNOSIS — I519 Heart disease, unspecified: Secondary | ICD-10-CM | POA: Diagnosis not present

## 2018-09-12 DIAGNOSIS — I482 Chronic atrial fibrillation, unspecified: Secondary | ICD-10-CM | POA: Diagnosis not present

## 2018-09-15 DIAGNOSIS — E785 Hyperlipidemia, unspecified: Secondary | ICD-10-CM | POA: Diagnosis not present

## 2018-09-15 DIAGNOSIS — E039 Hypothyroidism, unspecified: Secondary | ICD-10-CM | POA: Diagnosis not present

## 2018-09-15 DIAGNOSIS — I10 Essential (primary) hypertension: Secondary | ICD-10-CM | POA: Diagnosis not present

## 2018-09-15 DIAGNOSIS — Z950 Presence of cardiac pacemaker: Secondary | ICD-10-CM | POA: Diagnosis not present

## 2018-09-15 DIAGNOSIS — I519 Heart disease, unspecified: Secondary | ICD-10-CM | POA: Diagnosis not present

## 2018-09-15 DIAGNOSIS — I482 Chronic atrial fibrillation, unspecified: Secondary | ICD-10-CM | POA: Diagnosis not present

## 2018-09-22 DIAGNOSIS — I10 Essential (primary) hypertension: Secondary | ICD-10-CM | POA: Diagnosis not present

## 2018-09-22 DIAGNOSIS — I482 Chronic atrial fibrillation, unspecified: Secondary | ICD-10-CM | POA: Diagnosis not present

## 2018-09-22 DIAGNOSIS — E039 Hypothyroidism, unspecified: Secondary | ICD-10-CM | POA: Diagnosis not present

## 2018-09-22 DIAGNOSIS — Z950 Presence of cardiac pacemaker: Secondary | ICD-10-CM | POA: Diagnosis not present

## 2018-09-22 DIAGNOSIS — I519 Heart disease, unspecified: Secondary | ICD-10-CM | POA: Diagnosis not present

## 2018-09-22 DIAGNOSIS — E785 Hyperlipidemia, unspecified: Secondary | ICD-10-CM | POA: Diagnosis not present

## 2018-09-23 ENCOUNTER — Other Ambulatory Visit: Payer: Self-pay | Admitting: Family Medicine

## 2018-09-24 ENCOUNTER — Ambulatory Visit (INDEPENDENT_AMBULATORY_CARE_PROVIDER_SITE_OTHER): Payer: Medicare Other | Admitting: *Deleted

## 2018-09-24 ENCOUNTER — Other Ambulatory Visit: Payer: Self-pay

## 2018-09-24 DIAGNOSIS — I442 Atrioventricular block, complete: Secondary | ICD-10-CM | POA: Diagnosis not present

## 2018-09-24 DIAGNOSIS — I5032 Chronic diastolic (congestive) heart failure: Secondary | ICD-10-CM

## 2018-09-24 LAB — CUP PACEART REMOTE DEVICE CHECK
Battery Remaining Longevity: 102 mo
Battery Remaining Percentage: 95.5 %
Battery Voltage: 3.01 V
Brady Statistic RV Percent Paced: 98 %
Date Time Interrogation Session: 20200414060025
Implantable Lead Implant Date: 20090127
Implantable Lead Implant Date: 20090127
Implantable Lead Location: 753859
Implantable Lead Location: 753860
Implantable Pulse Generator Implant Date: 20190426
Lead Channel Impedance Value: 380 Ohm
Lead Channel Pacing Threshold Amplitude: 1 V
Lead Channel Pacing Threshold Pulse Width: 0.5 ms
Lead Channel Sensing Intrinsic Amplitude: 3.2 mV
Lead Channel Setting Pacing Amplitude: 2.5 V
Lead Channel Setting Pacing Pulse Width: 0.5 ms
Lead Channel Setting Sensing Sensitivity: 2.5 mV
Pulse Gen Model: 2272
Pulse Gen Serial Number: 9019173

## 2018-09-26 DIAGNOSIS — I482 Chronic atrial fibrillation, unspecified: Secondary | ICD-10-CM | POA: Diagnosis not present

## 2018-09-26 DIAGNOSIS — E785 Hyperlipidemia, unspecified: Secondary | ICD-10-CM | POA: Diagnosis not present

## 2018-09-26 DIAGNOSIS — Z950 Presence of cardiac pacemaker: Secondary | ICD-10-CM | POA: Diagnosis not present

## 2018-09-26 DIAGNOSIS — E039 Hypothyroidism, unspecified: Secondary | ICD-10-CM | POA: Diagnosis not present

## 2018-09-26 DIAGNOSIS — I10 Essential (primary) hypertension: Secondary | ICD-10-CM | POA: Diagnosis not present

## 2018-09-26 DIAGNOSIS — I519 Heart disease, unspecified: Secondary | ICD-10-CM | POA: Diagnosis not present

## 2018-10-01 NOTE — Progress Notes (Signed)
Remote pacemaker transmission.   

## 2018-10-02 DIAGNOSIS — Z950 Presence of cardiac pacemaker: Secondary | ICD-10-CM | POA: Diagnosis not present

## 2018-10-02 DIAGNOSIS — E039 Hypothyroidism, unspecified: Secondary | ICD-10-CM | POA: Diagnosis not present

## 2018-10-02 DIAGNOSIS — I519 Heart disease, unspecified: Secondary | ICD-10-CM | POA: Diagnosis not present

## 2018-10-02 DIAGNOSIS — E785 Hyperlipidemia, unspecified: Secondary | ICD-10-CM | POA: Diagnosis not present

## 2018-10-02 DIAGNOSIS — I10 Essential (primary) hypertension: Secondary | ICD-10-CM | POA: Diagnosis not present

## 2018-10-02 DIAGNOSIS — I482 Chronic atrial fibrillation, unspecified: Secondary | ICD-10-CM | POA: Diagnosis not present

## 2018-10-06 DIAGNOSIS — I519 Heart disease, unspecified: Secondary | ICD-10-CM | POA: Diagnosis not present

## 2018-10-06 DIAGNOSIS — Z950 Presence of cardiac pacemaker: Secondary | ICD-10-CM | POA: Diagnosis not present

## 2018-10-06 DIAGNOSIS — E785 Hyperlipidemia, unspecified: Secondary | ICD-10-CM | POA: Diagnosis not present

## 2018-10-06 DIAGNOSIS — I10 Essential (primary) hypertension: Secondary | ICD-10-CM | POA: Diagnosis not present

## 2018-10-06 DIAGNOSIS — E039 Hypothyroidism, unspecified: Secondary | ICD-10-CM | POA: Diagnosis not present

## 2018-10-06 DIAGNOSIS — I482 Chronic atrial fibrillation, unspecified: Secondary | ICD-10-CM | POA: Diagnosis not present

## 2018-10-10 DIAGNOSIS — E039 Hypothyroidism, unspecified: Secondary | ICD-10-CM | POA: Diagnosis not present

## 2018-10-10 DIAGNOSIS — I519 Heart disease, unspecified: Secondary | ICD-10-CM | POA: Diagnosis not present

## 2018-10-10 DIAGNOSIS — R42 Dizziness and giddiness: Secondary | ICD-10-CM | POA: Diagnosis not present

## 2018-10-10 DIAGNOSIS — I10 Essential (primary) hypertension: Secondary | ICD-10-CM | POA: Diagnosis not present

## 2018-10-10 DIAGNOSIS — E785 Hyperlipidemia, unspecified: Secondary | ICD-10-CM | POA: Diagnosis not present

## 2018-10-10 DIAGNOSIS — R531 Weakness: Secondary | ICD-10-CM | POA: Diagnosis not present

## 2018-10-10 DIAGNOSIS — R0602 Shortness of breath: Secondary | ICD-10-CM | POA: Diagnosis not present

## 2018-10-10 DIAGNOSIS — N183 Chronic kidney disease, stage 3 (moderate): Secondary | ICD-10-CM | POA: Diagnosis not present

## 2018-10-10 DIAGNOSIS — Z950 Presence of cardiac pacemaker: Secondary | ICD-10-CM | POA: Diagnosis not present

## 2018-10-10 DIAGNOSIS — I482 Chronic atrial fibrillation, unspecified: Secondary | ICD-10-CM | POA: Diagnosis not present

## 2018-10-15 DIAGNOSIS — E039 Hypothyroidism, unspecified: Secondary | ICD-10-CM | POA: Diagnosis not present

## 2018-10-15 DIAGNOSIS — I10 Essential (primary) hypertension: Secondary | ICD-10-CM | POA: Diagnosis not present

## 2018-10-15 DIAGNOSIS — Z950 Presence of cardiac pacemaker: Secondary | ICD-10-CM | POA: Diagnosis not present

## 2018-10-15 DIAGNOSIS — I519 Heart disease, unspecified: Secondary | ICD-10-CM | POA: Diagnosis not present

## 2018-10-15 DIAGNOSIS — E785 Hyperlipidemia, unspecified: Secondary | ICD-10-CM | POA: Diagnosis not present

## 2018-10-15 DIAGNOSIS — I482 Chronic atrial fibrillation, unspecified: Secondary | ICD-10-CM | POA: Diagnosis not present

## 2018-10-21 ENCOUNTER — Other Ambulatory Visit: Payer: Self-pay | Admitting: Family Medicine

## 2018-10-22 NOTE — Telephone Encounter (Signed)
Patient scheduled with Dr. Warrick Parisian for establish and refills.

## 2018-10-22 NOTE — Telephone Encounter (Signed)
[place]. NTBS LOV 09/2017

## 2018-10-24 DIAGNOSIS — E785 Hyperlipidemia, unspecified: Secondary | ICD-10-CM | POA: Diagnosis not present

## 2018-10-24 DIAGNOSIS — E039 Hypothyroidism, unspecified: Secondary | ICD-10-CM | POA: Diagnosis not present

## 2018-10-24 DIAGNOSIS — I482 Chronic atrial fibrillation, unspecified: Secondary | ICD-10-CM | POA: Diagnosis not present

## 2018-10-24 DIAGNOSIS — Z950 Presence of cardiac pacemaker: Secondary | ICD-10-CM | POA: Diagnosis not present

## 2018-10-24 DIAGNOSIS — I519 Heart disease, unspecified: Secondary | ICD-10-CM | POA: Diagnosis not present

## 2018-10-24 DIAGNOSIS — I10 Essential (primary) hypertension: Secondary | ICD-10-CM | POA: Diagnosis not present

## 2018-10-28 DIAGNOSIS — I519 Heart disease, unspecified: Secondary | ICD-10-CM | POA: Diagnosis not present

## 2018-10-28 DIAGNOSIS — E785 Hyperlipidemia, unspecified: Secondary | ICD-10-CM | POA: Diagnosis not present

## 2018-10-28 DIAGNOSIS — E039 Hypothyroidism, unspecified: Secondary | ICD-10-CM | POA: Diagnosis not present

## 2018-10-28 DIAGNOSIS — Z950 Presence of cardiac pacemaker: Secondary | ICD-10-CM | POA: Diagnosis not present

## 2018-10-28 DIAGNOSIS — I10 Essential (primary) hypertension: Secondary | ICD-10-CM | POA: Diagnosis not present

## 2018-10-28 DIAGNOSIS — I482 Chronic atrial fibrillation, unspecified: Secondary | ICD-10-CM | POA: Diagnosis not present

## 2018-11-03 DIAGNOSIS — I10 Essential (primary) hypertension: Secondary | ICD-10-CM | POA: Diagnosis not present

## 2018-11-03 DIAGNOSIS — E785 Hyperlipidemia, unspecified: Secondary | ICD-10-CM | POA: Diagnosis not present

## 2018-11-03 DIAGNOSIS — E039 Hypothyroidism, unspecified: Secondary | ICD-10-CM | POA: Diagnosis not present

## 2018-11-03 DIAGNOSIS — I482 Chronic atrial fibrillation, unspecified: Secondary | ICD-10-CM | POA: Diagnosis not present

## 2018-11-03 DIAGNOSIS — I519 Heart disease, unspecified: Secondary | ICD-10-CM | POA: Diagnosis not present

## 2018-11-03 DIAGNOSIS — Z950 Presence of cardiac pacemaker: Secondary | ICD-10-CM | POA: Diagnosis not present

## 2018-11-05 ENCOUNTER — Other Ambulatory Visit: Payer: Self-pay

## 2018-11-06 ENCOUNTER — Ambulatory Visit (INDEPENDENT_AMBULATORY_CARE_PROVIDER_SITE_OTHER): Payer: Medicare Other | Admitting: Family Medicine

## 2018-11-06 ENCOUNTER — Encounter: Payer: Self-pay | Admitting: Family Medicine

## 2018-11-06 VITALS — BP 125/60 | HR 71 | Temp 97.3°F | Ht 74.0 in | Wt 199.4 lb

## 2018-11-06 DIAGNOSIS — I4821 Permanent atrial fibrillation: Secondary | ICD-10-CM

## 2018-11-06 DIAGNOSIS — E039 Hypothyroidism, unspecified: Secondary | ICD-10-CM

## 2018-11-06 DIAGNOSIS — L409 Psoriasis, unspecified: Secondary | ICD-10-CM

## 2018-11-06 DIAGNOSIS — E1122 Type 2 diabetes mellitus with diabetic chronic kidney disease: Secondary | ICD-10-CM

## 2018-11-06 DIAGNOSIS — N3281 Overactive bladder: Secondary | ICD-10-CM

## 2018-11-06 DIAGNOSIS — N184 Chronic kidney disease, stage 4 (severe): Secondary | ICD-10-CM

## 2018-11-06 DIAGNOSIS — E785 Hyperlipidemia, unspecified: Secondary | ICD-10-CM | POA: Diagnosis not present

## 2018-11-06 LAB — BAYER DCA HB A1C WAIVED: HB A1C (BAYER DCA - WAIVED): 5.9 % (ref <7.0)

## 2018-11-06 MED ORDER — DOCUSATE SODIUM 100 MG PO CAPS: 100.0000 mg | ORAL_CAPSULE | Freq: Every day | ORAL | 2 refills | Status: AC

## 2018-11-06 MED ORDER — TRIAMCINOLONE ACETONIDE 0.1 % EX CREA: 1.0000 "application " | TOPICAL_CREAM | Freq: Two times a day (BID) | CUTANEOUS | 1 refills | Status: DC

## 2018-11-06 MED ORDER — LEVOTHYROXINE SODIUM 175 MCG PO TABS: 175.0000 ug | ORAL_TABLET | Freq: Every day | ORAL | 3 refills | Status: DC

## 2018-11-06 MED ORDER — KETOCONAZOLE 2 % EX SHAM: 1.0000 "application " | MEDICATED_SHAMPOO | CUTANEOUS | 1 refills | Status: DC

## 2018-11-06 MED ORDER — MIRABEGRON ER 25 MG PO TB24: 25.0000 mg | ORAL_TABLET | Freq: Two times a day (BID) | ORAL | 3 refills | Status: DC

## 2018-11-06 MED ORDER — FUROSEMIDE 20 MG PO TABS: 20.0000 mg | ORAL_TABLET | Freq: Every day | ORAL | 3 refills | Status: AC | PRN

## 2018-11-06 NOTE — Progress Notes (Signed)
BP 125/60   Pulse 71   Temp (!) 97.3 F (36.3 C) (Oral)   Ht '6\' 2"'  (1.88 m)   Wt 199 lb 6.4 oz (90.4 kg)   BMI 25.60 kg/m    Subjective:   Patient ID: [name], male    DOB: 1921/10/08, 85 y.o.   MRN: [id number]  HPI: [name] is a 85 y.o. male presenting on 11/06/2018 for Establish Care Wendi Snipes pt) and Medical Management of Chronic Issues   HPI Type 2 diabetes mellitus Patient comes in today for recheck of his diabetes. Patient has been currently taking no medication currently in his diet controlling, will check blood sugars on blood work today. Patient is not currently on an ACE inhibitor/ARB. Patient has not seen an ophthalmologist this year. Patient denies any new issues with feet, is mostly wheelchair-bound because of weakness in the lower extremities but denies any sores..  Patient has known stage IV CKD, patient is also legally blind with a glass eye in his right eye.  He has macular degeneration in the other eye.  Hypothyroidism recheck Patient is coming in for thyroid recheck today as well. They deny any issues with hair changes or heat or cold problems or diarrhea or constipation. They deny any chest pain or palpitations. They are currently on levothyroxine 136mcrograms   Hyperlipidemia Patient is coming in for recheck of his hyperlipidemia. The patient is currently taking no medication currently, used to be on Lipitor, will check blood work. They deny any issues with myalgias or history of liver damage from it. They deny any focal numbness or weakness or chest pain.   Patient is on a diuretic to help with swelling from his CKD but it leads to incontinence and sometimes without even knowing what he is doing.  He says he will wet himself overnight multiple times and just wake up wet.  He is taking Myrbetriq 25 once a day and it was prescribed for twice a day and he has not increased to that to this point, he will try increasing that and see if it makes a difference.   Patient has CAD with bypass graft and stents in his history and A. fib, he is currently on low-dose Eliquis for his blood thinner.  He denies any chest pain or trouble breathing or anything along those lines currently.  Patient is here for an exam to try [place] a [place] detailed his assessment such as his ability to do his ADLs.  Patient has difficulty because he is mostly wheelchair-bound cooking for himself and cleaning himself and getting in and out of the shower with his current type shower.  He is trying for a walk-in shower so he will be able to transfer more easily with a shower chair.  He is able to transfer on his own from the bed to the wheelchair using his upper extremities and he is able to weight-bear on his feet with support for very short distances of maybe up to 4-5 feet.  Relevant past medical, surgical, family and social history reviewed and updated as indicated. Interim medical history since our last visit reviewed. Allergies and medications reviewed and updated.  Review of Systems  Constitutional: Negative for chills and fever.  Eyes: Negative for visual disturbance.  Respiratory: Negative for shortness of breath and wheezing.   Cardiovascular: Positive for leg swelling (Normal for him). Negative for chest pain and palpitations.  Gastrointestinal: Negative for abdominal pain.  Genitourinary: Positive  for frequency and urgency. Negative for difficulty urinating, dysuria and flank pain.  Musculoskeletal: Negative for back pain and gait problem.  Skin: Negative for rash.  Neurological: Positive for weakness. Negative for dizziness, light-headedness and numbness.  All other systems reviewed and are negative.   Per HPI unless specifically indicated above   Allergies as of 11/06/2018   No Known Allergies     Medication List       Accurate as of Nov 06, 2018  2:00 PM. If you have any questions, ask your nurse or doctor.        STOP taking  these medications   ALPRAZolam 0.25 MG tablet Commonly known as:  Duanne Moron Stopped by:  Fransisca Kaufmann Genora Arp, MD   atorvastatin 80 MG tablet Commonly known as:  LIPITOR Stopped by:  Worthy Rancher, MD   Vitamin D3 125 MCG (5000 UT) Caps Stopped by:  Fransisca Kaufmann Airam Runions, MD     TAKE these medications   docusate sodium 100 MG capsule Commonly known as:  COLACE Take 1 capsule (100 mg total) by mouth daily.   Eliquis 2.5 MG Tabs tablet Generic drug:  apixaban TAKE 1 TABLET TWICE A DAY   feeding supplement (ENSURE ENLIVE) Liqd Take 237 mLs by mouth 3 (three) times daily between meals.   furosemide 20 MG tablet Commonly known as:  LASIX Take 1 tablet (20 mg total) by mouth daily as needed for fluid or edema (Shortness of breath).   glucose blood test strip Test BS BID and PRN. Dx e11.9   ketoconazole 2 % shampoo Commonly known as:  NIZORAL Apply 1 application topically 3 (three) times a week. Start taking on:  Nov 07, 2018   levothyroxine 175 MCG tablet Commonly known as:  SYNTHROID Take 1 tablet (175 mcg total) by mouth daily before breakfast. What changed:  See the new instructions. Changed by:  Worthy Rancher, MD   mirabegron ER 25 MG Tb24 tablet Commonly known as:  Myrbetriq Take 1 tablet (25 mg total) by mouth 2 (two) times daily. What changed:  how much to take Changed by:  Fransisca Kaufmann Elka Satterfield, MD   MULTIVITAMIN PO Take 1 tablet by mouth daily.   nitroGLYCERIN 0.4 MG SL tablet Commonly known as:  NITROSTAT Place 1 tablet (0.4 mg total) under the tongue every 5 (five) minutes as needed for chest pain (Do not exceed 3 doses).   THERATEARS OP Apply 1 drop to eye daily as needed (for dry eyes).   triamcinolone cream 0.1 % Commonly known as:  KENALOG Apply 1 application topically 2 (two) times daily. Started by:  Fransisca Kaufmann Jenifer Struve, MD   ZINC PO Take 1 tablet by mouth every other day.        Objective:   BP 125/60   Pulse 71   Temp (!) 97.3 F  (36.3 C) (Oral)   Ht '6\' 2"'  (1.88 m)   Wt 199 lb 6.4 oz (90.4 kg)   BMI 25.60 kg/m   Wt Readings from Last 3 Encounters:  11/06/18 199 lb 6.4 oz (90.4 kg)  02/20/18 192 lb (87.1 kg)  02/05/18 204 lb (92.5 kg)    Physical Exam Vitals signs and nursing note reviewed.  Constitutional:      General: He is not in acute distress.    Appearance: He is well-developed. He is not diaphoretic.  Eyes:     General: No scleral icterus.    Conjunctiva/sclera: Conjunctivae normal.  Neck:     Musculoskeletal: Neck supple.  Thyroid: No thyromegaly.  Cardiovascular:     Rate and Rhythm: Normal rate. Rhythm irregular.     Heart sounds: Normal heart sounds. No murmur.  Pulmonary:     Effort: Pulmonary effort is normal. No respiratory distress.     Breath sounds: Normal breath sounds. No wheezing.  Abdominal:     General: Abdomen is flat. Bowel sounds are normal. There is no distension.     Tenderness: There is no abdominal tenderness.  Musculoskeletal: Normal range of motion.        General: Swelling (1+ pitting edema bilateral lower extremity) present.  Lymphadenopathy:     Cervical: No cervical adenopathy.  Skin:    General: Skin is warm and dry.     Findings: Rash (Patient fights psoriasis, he has a patch of psoriasis behind his right ear and some small spots in his scalp on the back of his head) present.  Neurological:     Mental Status: He is alert and oriented to person, place, and time.     Coordination: Coordination normal.  Psychiatric:        Behavior: Behavior normal.       Assessment & Plan:   Problem List Items Addressed This Visit      Cardiovascular and Mediastinum   Atrial fibrillation-paroxysmal   Relevant Medications   furosemide (LASIX) 20 MG tablet     Endocrine   Hypothyroidism   Relevant Medications   levothyroxine (SYNTHROID) 175 MCG tablet   Other Relevant Orders   CMP14+EGFR   TSH   Type II diabetes mellitus with renal manifestations (HCC) -  Primary   Relevant Orders   CBC with Differential/Platelet   CMP14+EGFR   Bayer DCA Hb A1c Waived     Musculoskeletal and Integument   Psoriasis   Relevant Medications   ketoconazole (NIZORAL) 2 % shampoo (Start on 11/07/2018)   triamcinolone cream (KENALOG) 0.1 %     Genitourinary   Chronic kidney disease (CKD), stage IV (severe) (HCC)   Relevant Medications   furosemide (LASIX) 20 MG tablet   Other Relevant Orders   CMP14+EGFR   OAB (overactive bladder)   Relevant Medications   mirabegron ER (MYRBETRIQ) 25 MG TB24 tablet     Other   Hyperlipidemia   Relevant Medications   furosemide (LASIX) 20 MG tablet   Other Relevant Orders   Lipid panel      Have patient start taking Myrbetriq 2 pills a day Follow up plan: Return in about 3 months (around 02/06/2019), or if symptoms worsen or fail to improve, for Diabetes and hypertension cholesterol recheck.  Counseling provided for all of the vaccine components Orders Placed This Encounter  Procedures  . CBC with Differential/Platelet  . CMP14+EGFR  . Lipid panel  . Bayer DCA Hb A1c Waived  . TSH    Caryl Pina, MD [facility] 11/06/2018, 2:00 PM

## 2018-11-07 LAB — CBC WITH DIFFERENTIAL/PLATELET
Basophils Absolute: 0.1 10*3/uL (ref 0.0–0.2)
Basos: 1 %
EOS (ABSOLUTE): 0.2 10*3/uL (ref 0.0–0.4)
Eos: 4 %
Hematocrit: 38.3 % (ref 37.5–51.0)
Hemoglobin: 12.8 g/dL — ABNORMAL LOW (ref 13.0–17.7)
Immature Grans (Abs): 0 10*3/uL (ref 0.0–0.1)
Immature Granulocytes: 1 %
Lymphocytes Absolute: 1.3 10*3/uL (ref 0.7–3.1)
Lymphs: 22 %
MCH: 28.8 pg (ref 26.6–33.0)
MCHC: 33.4 g/dL (ref 31.5–35.7)
MCV: 86 fL (ref 79–97)
Monocytes Absolute: 0.7 10*3/uL (ref 0.1–0.9)
Monocytes: 12 %
Neutrophils Absolute: 3.7 10*3/uL (ref 1.4–7.0)
Neutrophils: 60 %
Platelets: 296 10*3/uL (ref 150–450)
RBC: 4.44 x10E6/uL (ref 4.14–5.80)
RDW: 13.8 % (ref 11.6–15.4)
WBC: 6 10*3/uL (ref 3.4–10.8)

## 2018-11-07 LAB — LIPID PANEL
Chol/HDL Ratio: 4 ratio (ref 0.0–5.0)
Cholesterol, Total: 190 mg/dL (ref 100–199)
HDL: 48 mg/dL (ref >39)
LDL Calculated: 88 mg/dL (ref 0–99)
Triglycerides: 268 mg/dL — ABNORMAL HIGH (ref 0–149)
VLDL Cholesterol Cal: 54 mg/dL — ABNORMAL HIGH (ref 5–40)

## 2018-11-07 LAB — CMP14+EGFR
ALT: 10 IU/L (ref 0–44)
AST: 18 IU/L (ref 0–40)
Albumin/Globulin Ratio: 1.4 (ref 1.2–2.2)
Albumin: 3.9 g/dL (ref 3.5–4.6)
Alkaline Phosphatase: 72 IU/L (ref 39–117)
BUN/Creatinine Ratio: 16 (ref 10–24)
BUN: 25 mg/dL (ref 10–36)
Bilirubin Total: 0.3 mg/dL (ref 0.0–1.2)
CO2: 25 mmol/L (ref 20–29)
Calcium: 9.3 mg/dL (ref 8.6–10.2)
Chloride: 100 mmol/L (ref 96–106)
Creatinine, Ser: 1.56 mg/dL — ABNORMAL HIGH (ref 0.76–1.27)
GFR calc Af Amer: 43 mL/min/{1.73_m2} — ABNORMAL LOW (ref >59)
GFR calc non Af Amer: 37 mL/min/{1.73_m2} — ABNORMAL LOW (ref >59)
Globulin, Total: 2.7 g/dL (ref 1.5–4.5)
Glucose: 109 mg/dL — ABNORMAL HIGH (ref 65–99)
Potassium: 4.5 mmol/L (ref 3.5–5.2)
Sodium: 139 mmol/L (ref 134–144)
Total Protein: 6.6 g/dL (ref 6.0–8.5)

## 2018-11-07 LAB — TSH: TSH: 0.449 u[IU]/mL — ABNORMAL LOW (ref 0.450–4.500)

## 2018-11-07 MED ORDER — LEVOTHYROXINE SODIUM 150 MCG PO TABS: 150.0000 ug | ORAL_TABLET | Freq: Every day | ORAL | 0 refills | Status: DC

## 2018-11-07 MED ORDER — LEVOTHYROXINE SODIUM 150 MCG PO TABS: 150.0000 ug | ORAL_TABLET | Freq: Every day | ORAL | 1 refills | Status: DC

## 2018-11-07 MED ORDER — MIRABEGRON ER 25 MG PO TB24: 25.0000 mg | ORAL_TABLET | Freq: Two times a day (BID) | ORAL | 3 refills | Status: DC

## 2018-11-07 NOTE — Addendum Note (Signed)
Addended by: Karle Plumber on: 11/07/2018 02:18 PM   Modules accepted: Orders

## 2018-11-07 NOTE — Addendum Note (Signed)
Addended by: Nigel Berthold C on: 11/07/2018 02:16 PM   Modules accepted: Orders

## 2018-11-10 DIAGNOSIS — R42 Dizziness and giddiness: Secondary | ICD-10-CM | POA: Diagnosis not present

## 2018-11-10 DIAGNOSIS — R0602 Shortness of breath: Secondary | ICD-10-CM | POA: Diagnosis not present

## 2018-11-10 DIAGNOSIS — E039 Hypothyroidism, unspecified: Secondary | ICD-10-CM | POA: Diagnosis not present

## 2018-11-10 DIAGNOSIS — E785 Hyperlipidemia, unspecified: Secondary | ICD-10-CM | POA: Diagnosis not present

## 2018-11-10 DIAGNOSIS — N183 Chronic kidney disease, stage 3 (moderate): Secondary | ICD-10-CM | POA: Diagnosis not present

## 2018-11-10 DIAGNOSIS — R531 Weakness: Secondary | ICD-10-CM | POA: Diagnosis not present

## 2018-11-10 DIAGNOSIS — Z950 Presence of cardiac pacemaker: Secondary | ICD-10-CM | POA: Diagnosis not present

## 2018-11-10 DIAGNOSIS — I482 Chronic atrial fibrillation, unspecified: Secondary | ICD-10-CM | POA: Diagnosis not present

## 2018-11-10 DIAGNOSIS — I519 Heart disease, unspecified: Secondary | ICD-10-CM | POA: Diagnosis not present

## 2018-11-10 DIAGNOSIS — I10 Essential (primary) hypertension: Secondary | ICD-10-CM | POA: Diagnosis not present

## 2018-11-12 DIAGNOSIS — Z950 Presence of cardiac pacemaker: Secondary | ICD-10-CM | POA: Diagnosis not present

## 2018-11-12 DIAGNOSIS — I482 Chronic atrial fibrillation, unspecified: Secondary | ICD-10-CM | POA: Diagnosis not present

## 2018-11-12 DIAGNOSIS — I10 Essential (primary) hypertension: Secondary | ICD-10-CM | POA: Diagnosis not present

## 2018-11-12 DIAGNOSIS — E039 Hypothyroidism, unspecified: Secondary | ICD-10-CM | POA: Diagnosis not present

## 2018-11-12 DIAGNOSIS — I519 Heart disease, unspecified: Secondary | ICD-10-CM | POA: Diagnosis not present

## 2018-11-12 DIAGNOSIS — E785 Hyperlipidemia, unspecified: Secondary | ICD-10-CM | POA: Diagnosis not present

## 2018-11-18 DIAGNOSIS — I482 Chronic atrial fibrillation, unspecified: Secondary | ICD-10-CM | POA: Diagnosis not present

## 2018-11-18 DIAGNOSIS — Z950 Presence of cardiac pacemaker: Secondary | ICD-10-CM | POA: Diagnosis not present

## 2018-11-18 DIAGNOSIS — I10 Essential (primary) hypertension: Secondary | ICD-10-CM | POA: Diagnosis not present

## 2018-11-18 DIAGNOSIS — E039 Hypothyroidism, unspecified: Secondary | ICD-10-CM | POA: Diagnosis not present

## 2018-11-18 DIAGNOSIS — E785 Hyperlipidemia, unspecified: Secondary | ICD-10-CM | POA: Diagnosis not present

## 2018-11-18 DIAGNOSIS — I519 Heart disease, unspecified: Secondary | ICD-10-CM | POA: Diagnosis not present

## 2018-11-25 DIAGNOSIS — I10 Essential (primary) hypertension: Secondary | ICD-10-CM | POA: Diagnosis not present

## 2018-11-25 DIAGNOSIS — E785 Hyperlipidemia, unspecified: Secondary | ICD-10-CM | POA: Diagnosis not present

## 2018-11-25 DIAGNOSIS — E039 Hypothyroidism, unspecified: Secondary | ICD-10-CM | POA: Diagnosis not present

## 2018-11-25 DIAGNOSIS — I519 Heart disease, unspecified: Secondary | ICD-10-CM | POA: Diagnosis not present

## 2018-11-25 DIAGNOSIS — I482 Chronic atrial fibrillation, unspecified: Secondary | ICD-10-CM | POA: Diagnosis not present

## 2018-11-25 DIAGNOSIS — Z950 Presence of cardiac pacemaker: Secondary | ICD-10-CM | POA: Diagnosis not present

## 2018-12-02 DIAGNOSIS — E039 Hypothyroidism, unspecified: Secondary | ICD-10-CM | POA: Diagnosis not present

## 2018-12-02 DIAGNOSIS — I519 Heart disease, unspecified: Secondary | ICD-10-CM | POA: Diagnosis not present

## 2018-12-02 DIAGNOSIS — I10 Essential (primary) hypertension: Secondary | ICD-10-CM | POA: Diagnosis not present

## 2018-12-02 DIAGNOSIS — E785 Hyperlipidemia, unspecified: Secondary | ICD-10-CM | POA: Diagnosis not present

## 2018-12-02 DIAGNOSIS — Z950 Presence of cardiac pacemaker: Secondary | ICD-10-CM | POA: Diagnosis not present

## 2018-12-02 DIAGNOSIS — I482 Chronic atrial fibrillation, unspecified: Secondary | ICD-10-CM | POA: Diagnosis not present

## 2018-12-08 DIAGNOSIS — I519 Heart disease, unspecified: Secondary | ICD-10-CM | POA: Diagnosis not present

## 2018-12-08 DIAGNOSIS — Z950 Presence of cardiac pacemaker: Secondary | ICD-10-CM | POA: Diagnosis not present

## 2018-12-08 DIAGNOSIS — I482 Chronic atrial fibrillation, unspecified: Secondary | ICD-10-CM | POA: Diagnosis not present

## 2018-12-08 DIAGNOSIS — I10 Essential (primary) hypertension: Secondary | ICD-10-CM | POA: Diagnosis not present

## 2018-12-08 DIAGNOSIS — E785 Hyperlipidemia, unspecified: Secondary | ICD-10-CM | POA: Diagnosis not present

## 2018-12-08 DIAGNOSIS — E039 Hypothyroidism, unspecified: Secondary | ICD-10-CM | POA: Diagnosis not present

## 2018-12-10 DIAGNOSIS — N183 Chronic kidney disease, stage 3 (moderate): Secondary | ICD-10-CM | POA: Diagnosis not present

## 2018-12-10 DIAGNOSIS — R42 Dizziness and giddiness: Secondary | ICD-10-CM | POA: Diagnosis not present

## 2018-12-10 DIAGNOSIS — R0602 Shortness of breath: Secondary | ICD-10-CM | POA: Diagnosis not present

## 2018-12-10 DIAGNOSIS — Z950 Presence of cardiac pacemaker: Secondary | ICD-10-CM | POA: Diagnosis not present

## 2018-12-10 DIAGNOSIS — I10 Essential (primary) hypertension: Secondary | ICD-10-CM | POA: Diagnosis not present

## 2018-12-10 DIAGNOSIS — I482 Chronic atrial fibrillation, unspecified: Secondary | ICD-10-CM | POA: Diagnosis not present

## 2018-12-10 DIAGNOSIS — I519 Heart disease, unspecified: Secondary | ICD-10-CM | POA: Diagnosis not present

## 2018-12-10 DIAGNOSIS — E785 Hyperlipidemia, unspecified: Secondary | ICD-10-CM | POA: Diagnosis not present

## 2018-12-10 DIAGNOSIS — R531 Weakness: Secondary | ICD-10-CM | POA: Diagnosis not present

## 2018-12-10 DIAGNOSIS — E039 Hypothyroidism, unspecified: Secondary | ICD-10-CM | POA: Diagnosis not present

## 2018-12-15 DIAGNOSIS — I519 Heart disease, unspecified: Secondary | ICD-10-CM | POA: Diagnosis not present

## 2018-12-15 DIAGNOSIS — E039 Hypothyroidism, unspecified: Secondary | ICD-10-CM | POA: Diagnosis not present

## 2018-12-15 DIAGNOSIS — Z950 Presence of cardiac pacemaker: Secondary | ICD-10-CM | POA: Diagnosis not present

## 2018-12-15 DIAGNOSIS — E785 Hyperlipidemia, unspecified: Secondary | ICD-10-CM | POA: Diagnosis not present

## 2018-12-15 DIAGNOSIS — I482 Chronic atrial fibrillation, unspecified: Secondary | ICD-10-CM | POA: Diagnosis not present

## 2018-12-15 DIAGNOSIS — I10 Essential (primary) hypertension: Secondary | ICD-10-CM | POA: Diagnosis not present

## 2018-12-23 LAB — CUP PACEART REMOTE DEVICE CHECK
Date Time Interrogation Session: 20200714185609
Implantable Lead Implant Date: 20090127
Implantable Lead Implant Date: 20090127
Implantable Lead Location: 753859
Implantable Lead Location: 753860
Implantable Pulse Generator Implant Date: 20190426
Pulse Gen Model: 2272
Pulse Gen Serial Number: 9019173

## 2018-12-24 ENCOUNTER — Ambulatory Visit (INDEPENDENT_AMBULATORY_CARE_PROVIDER_SITE_OTHER): Admitting: *Deleted

## 2018-12-24 DIAGNOSIS — I519 Heart disease, unspecified: Secondary | ICD-10-CM | POA: Diagnosis not present

## 2018-12-24 DIAGNOSIS — E039 Hypothyroidism, unspecified: Secondary | ICD-10-CM | POA: Diagnosis not present

## 2018-12-24 DIAGNOSIS — I10 Essential (primary) hypertension: Secondary | ICD-10-CM | POA: Diagnosis not present

## 2018-12-24 DIAGNOSIS — Z950 Presence of cardiac pacemaker: Secondary | ICD-10-CM | POA: Diagnosis not present

## 2018-12-24 DIAGNOSIS — I442 Atrioventricular block, complete: Secondary | ICD-10-CM | POA: Diagnosis not present

## 2018-12-24 DIAGNOSIS — E785 Hyperlipidemia, unspecified: Secondary | ICD-10-CM | POA: Diagnosis not present

## 2018-12-24 DIAGNOSIS — I482 Chronic atrial fibrillation, unspecified: Secondary | ICD-10-CM | POA: Diagnosis not present

## 2018-12-25 ENCOUNTER — Other Ambulatory Visit: Payer: Self-pay | Admitting: Family Medicine

## 2018-12-25 DIAGNOSIS — I10 Essential (primary) hypertension: Secondary | ICD-10-CM | POA: Diagnosis not present

## 2018-12-25 DIAGNOSIS — I482 Chronic atrial fibrillation, unspecified: Secondary | ICD-10-CM | POA: Diagnosis not present

## 2018-12-25 DIAGNOSIS — I519 Heart disease, unspecified: Secondary | ICD-10-CM | POA: Diagnosis not present

## 2018-12-25 DIAGNOSIS — Z950 Presence of cardiac pacemaker: Secondary | ICD-10-CM | POA: Diagnosis not present

## 2018-12-25 DIAGNOSIS — E039 Hypothyroidism, unspecified: Secondary | ICD-10-CM | POA: Diagnosis not present

## 2018-12-25 DIAGNOSIS — E785 Hyperlipidemia, unspecified: Secondary | ICD-10-CM | POA: Diagnosis not present

## 2018-12-30 DIAGNOSIS — Z950 Presence of cardiac pacemaker: Secondary | ICD-10-CM | POA: Diagnosis not present

## 2018-12-30 DIAGNOSIS — E039 Hypothyroidism, unspecified: Secondary | ICD-10-CM | POA: Diagnosis not present

## 2018-12-30 DIAGNOSIS — E785 Hyperlipidemia, unspecified: Secondary | ICD-10-CM | POA: Diagnosis not present

## 2018-12-30 DIAGNOSIS — I519 Heart disease, unspecified: Secondary | ICD-10-CM | POA: Diagnosis not present

## 2018-12-30 DIAGNOSIS — I482 Chronic atrial fibrillation, unspecified: Secondary | ICD-10-CM | POA: Diagnosis not present

## 2018-12-30 DIAGNOSIS — I10 Essential (primary) hypertension: Secondary | ICD-10-CM | POA: Diagnosis not present

## 2019-01-03 ENCOUNTER — Encounter: Payer: Self-pay | Admitting: Cardiology

## 2019-01-03 NOTE — Progress Notes (Signed)
Remote pacemaker transmission.   

## 2019-01-05 DIAGNOSIS — E785 Hyperlipidemia, unspecified: Secondary | ICD-10-CM | POA: Diagnosis not present

## 2019-01-05 DIAGNOSIS — Z950 Presence of cardiac pacemaker: Secondary | ICD-10-CM | POA: Diagnosis not present

## 2019-01-05 DIAGNOSIS — I519 Heart disease, unspecified: Secondary | ICD-10-CM | POA: Diagnosis not present

## 2019-01-05 DIAGNOSIS — I10 Essential (primary) hypertension: Secondary | ICD-10-CM | POA: Diagnosis not present

## 2019-01-05 DIAGNOSIS — I482 Chronic atrial fibrillation, unspecified: Secondary | ICD-10-CM | POA: Diagnosis not present

## 2019-01-05 DIAGNOSIS — E039 Hypothyroidism, unspecified: Secondary | ICD-10-CM | POA: Diagnosis not present

## 2019-01-10 DIAGNOSIS — N183 Chronic kidney disease, stage 3 (moderate): Secondary | ICD-10-CM | POA: Diagnosis not present

## 2019-01-10 DIAGNOSIS — I482 Chronic atrial fibrillation, unspecified: Secondary | ICD-10-CM | POA: Diagnosis not present

## 2019-01-10 DIAGNOSIS — I10 Essential (primary) hypertension: Secondary | ICD-10-CM | POA: Diagnosis not present

## 2019-01-10 DIAGNOSIS — Z950 Presence of cardiac pacemaker: Secondary | ICD-10-CM | POA: Diagnosis not present

## 2019-01-10 DIAGNOSIS — E785 Hyperlipidemia, unspecified: Secondary | ICD-10-CM | POA: Diagnosis not present

## 2019-01-10 DIAGNOSIS — R42 Dizziness and giddiness: Secondary | ICD-10-CM | POA: Diagnosis not present

## 2019-01-10 DIAGNOSIS — R0602 Shortness of breath: Secondary | ICD-10-CM | POA: Diagnosis not present

## 2019-01-10 DIAGNOSIS — E039 Hypothyroidism, unspecified: Secondary | ICD-10-CM | POA: Diagnosis not present

## 2019-01-10 DIAGNOSIS — I519 Heart disease, unspecified: Secondary | ICD-10-CM | POA: Diagnosis not present

## 2019-01-10 DIAGNOSIS — R531 Weakness: Secondary | ICD-10-CM | POA: Diagnosis not present

## 2019-01-12 DIAGNOSIS — E039 Hypothyroidism, unspecified: Secondary | ICD-10-CM | POA: Diagnosis not present

## 2019-01-12 DIAGNOSIS — I519 Heart disease, unspecified: Secondary | ICD-10-CM | POA: Diagnosis not present

## 2019-01-12 DIAGNOSIS — Z950 Presence of cardiac pacemaker: Secondary | ICD-10-CM | POA: Diagnosis not present

## 2019-01-12 DIAGNOSIS — E785 Hyperlipidemia, unspecified: Secondary | ICD-10-CM | POA: Diagnosis not present

## 2019-01-12 DIAGNOSIS — I10 Essential (primary) hypertension: Secondary | ICD-10-CM | POA: Diagnosis not present

## 2019-01-12 DIAGNOSIS — I482 Chronic atrial fibrillation, unspecified: Secondary | ICD-10-CM | POA: Diagnosis not present

## 2019-01-15 DIAGNOSIS — H9202 Otalgia, left ear: Secondary | ICD-10-CM | POA: Diagnosis not present

## 2019-01-21 DIAGNOSIS — I10 Essential (primary) hypertension: Secondary | ICD-10-CM | POA: Diagnosis not present

## 2019-01-21 DIAGNOSIS — Z950 Presence of cardiac pacemaker: Secondary | ICD-10-CM | POA: Diagnosis not present

## 2019-01-21 DIAGNOSIS — E785 Hyperlipidemia, unspecified: Secondary | ICD-10-CM | POA: Diagnosis not present

## 2019-01-21 DIAGNOSIS — I519 Heart disease, unspecified: Secondary | ICD-10-CM | POA: Diagnosis not present

## 2019-01-21 DIAGNOSIS — E039 Hypothyroidism, unspecified: Secondary | ICD-10-CM | POA: Diagnosis not present

## 2019-01-21 DIAGNOSIS — I482 Chronic atrial fibrillation, unspecified: Secondary | ICD-10-CM | POA: Diagnosis not present

## 2019-01-26 ENCOUNTER — Telehealth: Payer: Self-pay | Admitting: *Deleted

## 2019-01-26 DIAGNOSIS — I482 Chronic atrial fibrillation, unspecified: Secondary | ICD-10-CM | POA: Diagnosis not present

## 2019-01-26 DIAGNOSIS — I10 Essential (primary) hypertension: Secondary | ICD-10-CM | POA: Diagnosis not present

## 2019-01-26 DIAGNOSIS — Z950 Presence of cardiac pacemaker: Secondary | ICD-10-CM | POA: Diagnosis not present

## 2019-01-26 DIAGNOSIS — E785 Hyperlipidemia, unspecified: Secondary | ICD-10-CM | POA: Diagnosis not present

## 2019-01-26 DIAGNOSIS — I519 Heart disease, unspecified: Secondary | ICD-10-CM | POA: Diagnosis not present

## 2019-01-26 DIAGNOSIS — E039 Hypothyroidism, unspecified: Secondary | ICD-10-CM | POA: Diagnosis not present

## 2019-01-26 MED ORDER — SULFAMETHOXAZOLE-TRIMETHOPRIM 800-160 MG PO TABS: 1.0000 | ORAL_TABLET | Freq: Two times a day (BID) | ORAL | 0 refills | Status: DC

## 2019-01-26 NOTE — Telephone Encounter (Signed)
Please let the patient know that I sent Bactrim for him

## 2019-01-26 NOTE — Addendum Note (Signed)
Addended by: Caryl Pina on: 01/26/2019 01:04 PM   Modules accepted: Orders

## 2019-01-26 NOTE — Telephone Encounter (Signed)
Hospice nurse notified and verbalized understanding ?

## 2019-01-26 NOTE — Telephone Encounter (Signed)
TC from [place] w/ Hospice Pt went to Urgent care on 01/15/19 L ear swollen, red, put on Keflex 500 mg for 7 days, went off of it on 14th When Arbie Cookey saw him today, it is inflammed, looks like cellulitis Please advise on another round of Keflex or change to another abx

## 2019-01-28 ENCOUNTER — Other Ambulatory Visit: Payer: Self-pay | Admitting: *Deleted

## 2019-01-28 MED ORDER — APIXABAN 2.5 MG PO TABS: ORAL_TABLET | ORAL | 0 refills | Status: DC

## 2019-01-28 MED ORDER — APIXABAN 2.5 MG PO TABS: ORAL_TABLET | ORAL | 2 refills | Status: DC

## 2019-02-03 DIAGNOSIS — I10 Essential (primary) hypertension: Secondary | ICD-10-CM | POA: Diagnosis not present

## 2019-02-03 DIAGNOSIS — E039 Hypothyroidism, unspecified: Secondary | ICD-10-CM | POA: Diagnosis not present

## 2019-02-03 DIAGNOSIS — Z950 Presence of cardiac pacemaker: Secondary | ICD-10-CM | POA: Diagnosis not present

## 2019-02-03 DIAGNOSIS — E785 Hyperlipidemia, unspecified: Secondary | ICD-10-CM | POA: Diagnosis not present

## 2019-02-03 DIAGNOSIS — I519 Heart disease, unspecified: Secondary | ICD-10-CM | POA: Diagnosis not present

## 2019-02-03 DIAGNOSIS — I482 Chronic atrial fibrillation, unspecified: Secondary | ICD-10-CM | POA: Diagnosis not present

## 2019-02-04 DIAGNOSIS — I519 Heart disease, unspecified: Secondary | ICD-10-CM | POA: Diagnosis not present

## 2019-02-04 DIAGNOSIS — I10 Essential (primary) hypertension: Secondary | ICD-10-CM | POA: Diagnosis not present

## 2019-02-04 DIAGNOSIS — E785 Hyperlipidemia, unspecified: Secondary | ICD-10-CM | POA: Diagnosis not present

## 2019-02-04 DIAGNOSIS — E039 Hypothyroidism, unspecified: Secondary | ICD-10-CM | POA: Diagnosis not present

## 2019-02-04 DIAGNOSIS — Z950 Presence of cardiac pacemaker: Secondary | ICD-10-CM | POA: Diagnosis not present

## 2019-02-04 DIAGNOSIS — I482 Chronic atrial fibrillation, unspecified: Secondary | ICD-10-CM | POA: Diagnosis not present

## 2019-02-09 DIAGNOSIS — I519 Heart disease, unspecified: Secondary | ICD-10-CM | POA: Diagnosis not present

## 2019-02-09 DIAGNOSIS — I10 Essential (primary) hypertension: Secondary | ICD-10-CM | POA: Diagnosis not present

## 2019-02-09 DIAGNOSIS — E785 Hyperlipidemia, unspecified: Secondary | ICD-10-CM | POA: Diagnosis not present

## 2019-02-09 DIAGNOSIS — I482 Chronic atrial fibrillation, unspecified: Secondary | ICD-10-CM | POA: Diagnosis not present

## 2019-02-09 DIAGNOSIS — E039 Hypothyroidism, unspecified: Secondary | ICD-10-CM | POA: Diagnosis not present

## 2019-02-09 DIAGNOSIS — Z950 Presence of cardiac pacemaker: Secondary | ICD-10-CM | POA: Diagnosis not present

## 2019-02-10 DIAGNOSIS — R0602 Shortness of breath: Secondary | ICD-10-CM | POA: Diagnosis not present

## 2019-02-10 DIAGNOSIS — Z950 Presence of cardiac pacemaker: Secondary | ICD-10-CM | POA: Diagnosis not present

## 2019-02-10 DIAGNOSIS — I482 Chronic atrial fibrillation, unspecified: Secondary | ICD-10-CM | POA: Diagnosis not present

## 2019-02-10 DIAGNOSIS — I10 Essential (primary) hypertension: Secondary | ICD-10-CM | POA: Diagnosis not present

## 2019-02-10 DIAGNOSIS — E785 Hyperlipidemia, unspecified: Secondary | ICD-10-CM | POA: Diagnosis not present

## 2019-02-10 DIAGNOSIS — R42 Dizziness and giddiness: Secondary | ICD-10-CM | POA: Diagnosis not present

## 2019-02-10 DIAGNOSIS — E039 Hypothyroidism, unspecified: Secondary | ICD-10-CM | POA: Diagnosis not present

## 2019-02-10 DIAGNOSIS — I519 Heart disease, unspecified: Secondary | ICD-10-CM | POA: Diagnosis not present

## 2019-02-10 DIAGNOSIS — R531 Weakness: Secondary | ICD-10-CM | POA: Diagnosis not present

## 2019-02-10 DIAGNOSIS — N183 Chronic kidney disease, stage 3 (moderate): Secondary | ICD-10-CM | POA: Diagnosis not present

## 2019-02-16 DIAGNOSIS — H60333 Swimmer's ear, bilateral: Secondary | ICD-10-CM | POA: Diagnosis not present

## 2019-02-16 DIAGNOSIS — H6123 Impacted cerumen, bilateral: Secondary | ICD-10-CM | POA: Diagnosis not present

## 2019-02-18 DIAGNOSIS — I482 Chronic atrial fibrillation, unspecified: Secondary | ICD-10-CM | POA: Diagnosis not present

## 2019-02-18 DIAGNOSIS — E785 Hyperlipidemia, unspecified: Secondary | ICD-10-CM | POA: Diagnosis not present

## 2019-02-18 DIAGNOSIS — I519 Heart disease, unspecified: Secondary | ICD-10-CM | POA: Diagnosis not present

## 2019-02-18 DIAGNOSIS — Z950 Presence of cardiac pacemaker: Secondary | ICD-10-CM | POA: Diagnosis not present

## 2019-02-18 DIAGNOSIS — I10 Essential (primary) hypertension: Secondary | ICD-10-CM | POA: Diagnosis not present

## 2019-02-18 DIAGNOSIS — E039 Hypothyroidism, unspecified: Secondary | ICD-10-CM | POA: Diagnosis not present

## 2019-02-23 DIAGNOSIS — I482 Chronic atrial fibrillation, unspecified: Secondary | ICD-10-CM | POA: Diagnosis not present

## 2019-02-23 DIAGNOSIS — E039 Hypothyroidism, unspecified: Secondary | ICD-10-CM | POA: Diagnosis not present

## 2019-02-23 DIAGNOSIS — I10 Essential (primary) hypertension: Secondary | ICD-10-CM | POA: Diagnosis not present

## 2019-02-23 DIAGNOSIS — I519 Heart disease, unspecified: Secondary | ICD-10-CM | POA: Diagnosis not present

## 2019-02-23 DIAGNOSIS — E785 Hyperlipidemia, unspecified: Secondary | ICD-10-CM | POA: Diagnosis not present

## 2019-02-23 DIAGNOSIS — Z950 Presence of cardiac pacemaker: Secondary | ICD-10-CM | POA: Diagnosis not present

## 2019-02-25 DIAGNOSIS — Z950 Presence of cardiac pacemaker: Secondary | ICD-10-CM | POA: Diagnosis not present

## 2019-02-25 DIAGNOSIS — E785 Hyperlipidemia, unspecified: Secondary | ICD-10-CM | POA: Diagnosis not present

## 2019-02-25 DIAGNOSIS — I482 Chronic atrial fibrillation, unspecified: Secondary | ICD-10-CM | POA: Diagnosis not present

## 2019-02-25 DIAGNOSIS — I10 Essential (primary) hypertension: Secondary | ICD-10-CM | POA: Diagnosis not present

## 2019-02-25 DIAGNOSIS — I519 Heart disease, unspecified: Secondary | ICD-10-CM | POA: Diagnosis not present

## 2019-02-25 DIAGNOSIS — E039 Hypothyroidism, unspecified: Secondary | ICD-10-CM | POA: Diagnosis not present

## 2019-02-26 ENCOUNTER — Other Ambulatory Visit: Payer: Self-pay | Admitting: *Deleted

## 2019-02-26 DIAGNOSIS — L409 Psoriasis, unspecified: Secondary | ICD-10-CM

## 2019-02-26 MED ORDER — TRIAMCINOLONE ACETONIDE 0.1 % EX CREA: 1.0000 "application " | TOPICAL_CREAM | Freq: Two times a day (BID) | CUTANEOUS | 1 refills | Status: AC

## 2019-02-26 NOTE — Telephone Encounter (Signed)
I do not see which medication is actually being requested

## 2019-02-27 MED ORDER — KETOCONAZOLE 2 % EX SHAM: 1.0000 "application " | MEDICATED_SHAMPOO | CUTANEOUS | 1 refills | Status: AC

## 2019-03-02 DIAGNOSIS — E785 Hyperlipidemia, unspecified: Secondary | ICD-10-CM | POA: Diagnosis not present

## 2019-03-02 DIAGNOSIS — I482 Chronic atrial fibrillation, unspecified: Secondary | ICD-10-CM | POA: Diagnosis not present

## 2019-03-02 DIAGNOSIS — I10 Essential (primary) hypertension: Secondary | ICD-10-CM | POA: Diagnosis not present

## 2019-03-02 DIAGNOSIS — Z950 Presence of cardiac pacemaker: Secondary | ICD-10-CM | POA: Diagnosis not present

## 2019-03-02 DIAGNOSIS — E039 Hypothyroidism, unspecified: Secondary | ICD-10-CM | POA: Diagnosis not present

## 2019-03-02 DIAGNOSIS — I519 Heart disease, unspecified: Secondary | ICD-10-CM | POA: Diagnosis not present

## 2019-03-04 DIAGNOSIS — E039 Hypothyroidism, unspecified: Secondary | ICD-10-CM | POA: Diagnosis not present

## 2019-03-04 DIAGNOSIS — I519 Heart disease, unspecified: Secondary | ICD-10-CM | POA: Diagnosis not present

## 2019-03-04 DIAGNOSIS — I10 Essential (primary) hypertension: Secondary | ICD-10-CM | POA: Diagnosis not present

## 2019-03-04 DIAGNOSIS — E785 Hyperlipidemia, unspecified: Secondary | ICD-10-CM | POA: Diagnosis not present

## 2019-03-04 DIAGNOSIS — Z950 Presence of cardiac pacemaker: Secondary | ICD-10-CM | POA: Diagnosis not present

## 2019-03-04 DIAGNOSIS — I482 Chronic atrial fibrillation, unspecified: Secondary | ICD-10-CM | POA: Diagnosis not present

## 2019-03-09 DIAGNOSIS — I482 Chronic atrial fibrillation, unspecified: Secondary | ICD-10-CM | POA: Diagnosis not present

## 2019-03-09 DIAGNOSIS — I519 Heart disease, unspecified: Secondary | ICD-10-CM | POA: Diagnosis not present

## 2019-03-09 DIAGNOSIS — E039 Hypothyroidism, unspecified: Secondary | ICD-10-CM | POA: Diagnosis not present

## 2019-03-09 DIAGNOSIS — Z950 Presence of cardiac pacemaker: Secondary | ICD-10-CM | POA: Diagnosis not present

## 2019-03-09 DIAGNOSIS — E785 Hyperlipidemia, unspecified: Secondary | ICD-10-CM | POA: Diagnosis not present

## 2019-03-09 DIAGNOSIS — I10 Essential (primary) hypertension: Secondary | ICD-10-CM | POA: Diagnosis not present

## 2019-03-12 DIAGNOSIS — I482 Chronic atrial fibrillation, unspecified: Secondary | ICD-10-CM | POA: Diagnosis not present

## 2019-03-12 DIAGNOSIS — R531 Weakness: Secondary | ICD-10-CM | POA: Diagnosis not present

## 2019-03-12 DIAGNOSIS — E785 Hyperlipidemia, unspecified: Secondary | ICD-10-CM | POA: Diagnosis not present

## 2019-03-12 DIAGNOSIS — R0602 Shortness of breath: Secondary | ICD-10-CM | POA: Diagnosis not present

## 2019-03-12 DIAGNOSIS — I10 Essential (primary) hypertension: Secondary | ICD-10-CM | POA: Diagnosis not present

## 2019-03-12 DIAGNOSIS — R42 Dizziness and giddiness: Secondary | ICD-10-CM | POA: Diagnosis not present

## 2019-03-12 DIAGNOSIS — E039 Hypothyroidism, unspecified: Secondary | ICD-10-CM | POA: Diagnosis not present

## 2019-03-12 DIAGNOSIS — Z950 Presence of cardiac pacemaker: Secondary | ICD-10-CM | POA: Diagnosis not present

## 2019-03-12 DIAGNOSIS — I519 Heart disease, unspecified: Secondary | ICD-10-CM | POA: Diagnosis not present

## 2019-03-17 DIAGNOSIS — I10 Essential (primary) hypertension: Secondary | ICD-10-CM | POA: Diagnosis not present

## 2019-03-17 DIAGNOSIS — I519 Heart disease, unspecified: Secondary | ICD-10-CM | POA: Diagnosis not present

## 2019-03-17 DIAGNOSIS — E039 Hypothyroidism, unspecified: Secondary | ICD-10-CM | POA: Diagnosis not present

## 2019-03-17 DIAGNOSIS — I482 Chronic atrial fibrillation, unspecified: Secondary | ICD-10-CM | POA: Diagnosis not present

## 2019-03-17 DIAGNOSIS — E785 Hyperlipidemia, unspecified: Secondary | ICD-10-CM | POA: Diagnosis not present

## 2019-03-17 DIAGNOSIS — Z950 Presence of cardiac pacemaker: Secondary | ICD-10-CM | POA: Diagnosis not present

## 2019-03-23 DIAGNOSIS — E039 Hypothyroidism, unspecified: Secondary | ICD-10-CM | POA: Diagnosis not present

## 2019-03-23 DIAGNOSIS — Z950 Presence of cardiac pacemaker: Secondary | ICD-10-CM | POA: Diagnosis not present

## 2019-03-23 DIAGNOSIS — E785 Hyperlipidemia, unspecified: Secondary | ICD-10-CM | POA: Diagnosis not present

## 2019-03-23 DIAGNOSIS — I10 Essential (primary) hypertension: Secondary | ICD-10-CM | POA: Diagnosis not present

## 2019-03-23 DIAGNOSIS — I519 Heart disease, unspecified: Secondary | ICD-10-CM | POA: Diagnosis not present

## 2019-03-23 DIAGNOSIS — I482 Chronic atrial fibrillation, unspecified: Secondary | ICD-10-CM | POA: Diagnosis not present

## 2019-03-25 ENCOUNTER — Ambulatory Visit (INDEPENDENT_AMBULATORY_CARE_PROVIDER_SITE_OTHER): Admitting: *Deleted

## 2019-03-25 DIAGNOSIS — Z950 Presence of cardiac pacemaker: Secondary | ICD-10-CM | POA: Diagnosis not present

## 2019-03-25 DIAGNOSIS — E785 Hyperlipidemia, unspecified: Secondary | ICD-10-CM | POA: Diagnosis not present

## 2019-03-25 DIAGNOSIS — E039 Hypothyroidism, unspecified: Secondary | ICD-10-CM | POA: Diagnosis not present

## 2019-03-25 DIAGNOSIS — I4821 Permanent atrial fibrillation: Secondary | ICD-10-CM

## 2019-03-25 DIAGNOSIS — I442 Atrioventricular block, complete: Secondary | ICD-10-CM

## 2019-03-25 DIAGNOSIS — I10 Essential (primary) hypertension: Secondary | ICD-10-CM | POA: Diagnosis not present

## 2019-03-25 DIAGNOSIS — I519 Heart disease, unspecified: Secondary | ICD-10-CM | POA: Diagnosis not present

## 2019-03-25 DIAGNOSIS — I482 Chronic atrial fibrillation, unspecified: Secondary | ICD-10-CM | POA: Diagnosis not present

## 2019-03-26 LAB — CUP PACEART REMOTE DEVICE CHECK
Battery Remaining Longevity: 106 mo
Battery Remaining Percentage: 95.5 %
Battery Voltage: 2.99 V
Brady Statistic RV Percent Paced: 98 %
Date Time Interrogation Session: 20201013063544
Implantable Lead Implant Date: 20090127
Implantable Lead Implant Date: 20090127
Implantable Lead Location: 753859
Implantable Lead Location: 753860
Implantable Pulse Generator Implant Date: 20190426
Lead Channel Impedance Value: 360 Ohm
Lead Channel Pacing Threshold Amplitude: 1 V
Lead Channel Pacing Threshold Pulse Width: 0.5 ms
Lead Channel Sensing Intrinsic Amplitude: 7.6 mV
Lead Channel Setting Pacing Amplitude: 2.5 V
Lead Channel Setting Pacing Pulse Width: 0.5 ms
Lead Channel Setting Sensing Sensitivity: 2.5 mV
Pulse Gen Model: 2272
Pulse Gen Serial Number: 9019173

## 2019-04-02 DIAGNOSIS — I10 Essential (primary) hypertension: Secondary | ICD-10-CM | POA: Diagnosis not present

## 2019-04-02 DIAGNOSIS — I482 Chronic atrial fibrillation, unspecified: Secondary | ICD-10-CM | POA: Diagnosis not present

## 2019-04-02 DIAGNOSIS — E785 Hyperlipidemia, unspecified: Secondary | ICD-10-CM | POA: Diagnosis not present

## 2019-04-02 DIAGNOSIS — Z950 Presence of cardiac pacemaker: Secondary | ICD-10-CM | POA: Diagnosis not present

## 2019-04-02 DIAGNOSIS — I519 Heart disease, unspecified: Secondary | ICD-10-CM | POA: Diagnosis not present

## 2019-04-02 DIAGNOSIS — E039 Hypothyroidism, unspecified: Secondary | ICD-10-CM | POA: Diagnosis not present

## 2019-04-03 DIAGNOSIS — E039 Hypothyroidism, unspecified: Secondary | ICD-10-CM | POA: Diagnosis not present

## 2019-04-03 DIAGNOSIS — I519 Heart disease, unspecified: Secondary | ICD-10-CM | POA: Diagnosis not present

## 2019-04-03 DIAGNOSIS — E785 Hyperlipidemia, unspecified: Secondary | ICD-10-CM | POA: Diagnosis not present

## 2019-04-03 DIAGNOSIS — I10 Essential (primary) hypertension: Secondary | ICD-10-CM | POA: Diagnosis not present

## 2019-04-03 DIAGNOSIS — I482 Chronic atrial fibrillation, unspecified: Secondary | ICD-10-CM | POA: Diagnosis not present

## 2019-04-03 DIAGNOSIS — Z950 Presence of cardiac pacemaker: Secondary | ICD-10-CM | POA: Diagnosis not present

## 2019-04-05 ENCOUNTER — Other Ambulatory Visit: Payer: Self-pay | Admitting: Family Medicine

## 2019-04-06 DIAGNOSIS — Z950 Presence of cardiac pacemaker: Secondary | ICD-10-CM | POA: Diagnosis not present

## 2019-04-06 DIAGNOSIS — E785 Hyperlipidemia, unspecified: Secondary | ICD-10-CM | POA: Diagnosis not present

## 2019-04-06 DIAGNOSIS — I519 Heart disease, unspecified: Secondary | ICD-10-CM | POA: Diagnosis not present

## 2019-04-06 DIAGNOSIS — I482 Chronic atrial fibrillation, unspecified: Secondary | ICD-10-CM | POA: Diagnosis not present

## 2019-04-06 DIAGNOSIS — I10 Essential (primary) hypertension: Secondary | ICD-10-CM | POA: Diagnosis not present

## 2019-04-06 DIAGNOSIS — E039 Hypothyroidism, unspecified: Secondary | ICD-10-CM | POA: Diagnosis not present

## 2019-04-08 NOTE — Progress Notes (Signed)
Remote pacemaker transmission.   

## 2019-04-12 DIAGNOSIS — E039 Hypothyroidism, unspecified: Secondary | ICD-10-CM | POA: Diagnosis not present

## 2019-04-12 DIAGNOSIS — R0602 Shortness of breath: Secondary | ICD-10-CM | POA: Diagnosis not present

## 2019-04-12 DIAGNOSIS — R42 Dizziness and giddiness: Secondary | ICD-10-CM | POA: Diagnosis not present

## 2019-04-12 DIAGNOSIS — R531 Weakness: Secondary | ICD-10-CM | POA: Diagnosis not present

## 2019-04-12 DIAGNOSIS — I10 Essential (primary) hypertension: Secondary | ICD-10-CM | POA: Diagnosis not present

## 2019-04-12 DIAGNOSIS — I482 Chronic atrial fibrillation, unspecified: Secondary | ICD-10-CM | POA: Diagnosis not present

## 2019-04-12 DIAGNOSIS — Z950 Presence of cardiac pacemaker: Secondary | ICD-10-CM | POA: Diagnosis not present

## 2019-04-12 DIAGNOSIS — I519 Heart disease, unspecified: Secondary | ICD-10-CM | POA: Diagnosis not present

## 2019-04-12 DIAGNOSIS — E785 Hyperlipidemia, unspecified: Secondary | ICD-10-CM | POA: Diagnosis not present

## 2019-04-13 DIAGNOSIS — I10 Essential (primary) hypertension: Secondary | ICD-10-CM | POA: Diagnosis not present

## 2019-04-13 DIAGNOSIS — I482 Chronic atrial fibrillation, unspecified: Secondary | ICD-10-CM | POA: Diagnosis not present

## 2019-04-13 DIAGNOSIS — E785 Hyperlipidemia, unspecified: Secondary | ICD-10-CM | POA: Diagnosis not present

## 2019-04-13 DIAGNOSIS — Z950 Presence of cardiac pacemaker: Secondary | ICD-10-CM | POA: Diagnosis not present

## 2019-04-13 DIAGNOSIS — E039 Hypothyroidism, unspecified: Secondary | ICD-10-CM | POA: Diagnosis not present

## 2019-04-13 DIAGNOSIS — I519 Heart disease, unspecified: Secondary | ICD-10-CM | POA: Diagnosis not present

## 2019-04-21 DIAGNOSIS — E785 Hyperlipidemia, unspecified: Secondary | ICD-10-CM | POA: Diagnosis not present

## 2019-04-21 DIAGNOSIS — I519 Heart disease, unspecified: Secondary | ICD-10-CM | POA: Diagnosis not present

## 2019-04-21 DIAGNOSIS — E039 Hypothyroidism, unspecified: Secondary | ICD-10-CM | POA: Diagnosis not present

## 2019-04-21 DIAGNOSIS — I482 Chronic atrial fibrillation, unspecified: Secondary | ICD-10-CM | POA: Diagnosis not present

## 2019-04-21 DIAGNOSIS — Z950 Presence of cardiac pacemaker: Secondary | ICD-10-CM | POA: Diagnosis not present

## 2019-04-21 DIAGNOSIS — I10 Essential (primary) hypertension: Secondary | ICD-10-CM | POA: Diagnosis not present

## 2019-04-24 ENCOUNTER — Other Ambulatory Visit: Payer: Self-pay | Admitting: Family Medicine

## 2019-04-24 ENCOUNTER — Other Ambulatory Visit: Payer: Medicare Other

## 2019-04-24 DIAGNOSIS — N184 Chronic kidney disease, stage 4 (severe): Secondary | ICD-10-CM

## 2019-04-24 LAB — URINALYSIS, COMPLETE
Bilirubin, UA: NEGATIVE
Glucose, UA: NEGATIVE
Ketones, UA: NEGATIVE
Leukocytes,UA: NEGATIVE
Nitrite, UA: NEGATIVE
Protein,UA: NEGATIVE
RBC, UA: NEGATIVE
Specific Gravity, UA: 1.025 (ref 1.005–1.030)
Urobilinogen, Ur: 0.2 mg/dL (ref 0.2–1.0)
pH, UA: 6 (ref 5.0–7.5)

## 2019-04-24 LAB — MICROSCOPIC EXAMINATION
Bacteria, UA: NONE SEEN
RBC, Urine: NONE SEEN /hpf (ref 0–2)
Renal Epithel, UA: NONE SEEN /hpf
WBC, UA: NONE SEEN /hpf (ref 0–5)

## 2019-04-24 MED ORDER — CEPHALEXIN 500 MG PO CAPS: 500.0000 mg | ORAL_CAPSULE | Freq: Four times a day (QID) | ORAL | 0 refills | Status: DC

## 2019-04-24 NOTE — Telephone Encounter (Signed)
Advised daughter that antibiotic has been sent in and asked her to have Hospice nurse to get a urine sample to bring for a urine culture.

## 2019-04-24 NOTE — Telephone Encounter (Signed)
Hospice is calling about patient.  They then he may have a UTI.   He is experiencing excessive urination and some mild discomfort.  Can you please order an antibiotic.

## 2019-04-24 NOTE — Telephone Encounter (Signed)
Hospice pt, can you send something in or do we need to get urine specimen dropped off to check?

## 2019-04-24 NOTE — Telephone Encounter (Signed)
Please let the patient know that I sent an antibiotic for him, they may still want to collect the urine and drop it off just so we can run a culture but if they cannot then that is fine they can just take the antibiotic.

## 2019-04-28 ENCOUNTER — Telehealth: Payer: Self-pay | Admitting: Family Medicine

## 2019-04-28 NOTE — Telephone Encounter (Signed)
Aware of no infection.

## 2019-04-29 DIAGNOSIS — I482 Chronic atrial fibrillation, unspecified: Secondary | ICD-10-CM | POA: Diagnosis not present

## 2019-04-29 DIAGNOSIS — E039 Hypothyroidism, unspecified: Secondary | ICD-10-CM | POA: Diagnosis not present

## 2019-04-29 DIAGNOSIS — I10 Essential (primary) hypertension: Secondary | ICD-10-CM | POA: Diagnosis not present

## 2019-04-29 DIAGNOSIS — I519 Heart disease, unspecified: Secondary | ICD-10-CM | POA: Diagnosis not present

## 2019-04-29 DIAGNOSIS — Z950 Presence of cardiac pacemaker: Secondary | ICD-10-CM | POA: Diagnosis not present

## 2019-04-29 DIAGNOSIS — E785 Hyperlipidemia, unspecified: Secondary | ICD-10-CM | POA: Diagnosis not present

## 2019-05-04 DIAGNOSIS — I10 Essential (primary) hypertension: Secondary | ICD-10-CM | POA: Diagnosis not present

## 2019-05-04 DIAGNOSIS — Z950 Presence of cardiac pacemaker: Secondary | ICD-10-CM | POA: Diagnosis not present

## 2019-05-04 DIAGNOSIS — I482 Chronic atrial fibrillation, unspecified: Secondary | ICD-10-CM | POA: Diagnosis not present

## 2019-05-04 DIAGNOSIS — I519 Heart disease, unspecified: Secondary | ICD-10-CM | POA: Diagnosis not present

## 2019-05-04 DIAGNOSIS — E785 Hyperlipidemia, unspecified: Secondary | ICD-10-CM | POA: Diagnosis not present

## 2019-05-04 DIAGNOSIS — E039 Hypothyroidism, unspecified: Secondary | ICD-10-CM | POA: Diagnosis not present

## 2019-05-05 ENCOUNTER — Other Ambulatory Visit: Payer: Self-pay | Admitting: Family Medicine

## 2019-05-05 NOTE — Telephone Encounter (Signed)
Dettinger. Needs 6 mos labwork. Mail order refill sent

## 2019-05-05 NOTE — Telephone Encounter (Signed)
Left detailed messager per dpr.

## 2019-05-06 DIAGNOSIS — Z950 Presence of cardiac pacemaker: Secondary | ICD-10-CM | POA: Diagnosis not present

## 2019-05-06 DIAGNOSIS — E039 Hypothyroidism, unspecified: Secondary | ICD-10-CM | POA: Diagnosis not present

## 2019-05-06 DIAGNOSIS — I519 Heart disease, unspecified: Secondary | ICD-10-CM | POA: Diagnosis not present

## 2019-05-06 DIAGNOSIS — I10 Essential (primary) hypertension: Secondary | ICD-10-CM | POA: Diagnosis not present

## 2019-05-06 DIAGNOSIS — I482 Chronic atrial fibrillation, unspecified: Secondary | ICD-10-CM | POA: Diagnosis not present

## 2019-05-06 DIAGNOSIS — E785 Hyperlipidemia, unspecified: Secondary | ICD-10-CM | POA: Diagnosis not present

## 2019-06-02 ENCOUNTER — Telehealth: Payer: Self-pay | Admitting: Family Medicine

## 2019-06-02 ENCOUNTER — Other Ambulatory Visit: Payer: Self-pay | Admitting: Family Medicine

## 2019-06-02 MED ORDER — CEPHALEXIN 500 MG PO CAPS: 500.0000 mg | ORAL_CAPSULE | Freq: Four times a day (QID) | ORAL | 0 refills | Status: DC

## 2019-06-02 NOTE — Telephone Encounter (Signed)
Tabitha from Hospice says that patient has a history of getting cellulitis and says today his lower extremedy is red and tight. Wants to know if an antibiotic can be prescribed to him without an appt.

## 2019-06-02 NOTE — Telephone Encounter (Signed)
I sent in a refill of keflex. Dr. Keturah Barre. Has given it to him before. WS

## 2019-06-12 DIAGNOSIS — I519 Heart disease, unspecified: Secondary | ICD-10-CM | POA: Diagnosis not present

## 2019-06-12 DIAGNOSIS — R531 Weakness: Secondary | ICD-10-CM | POA: Diagnosis not present

## 2019-06-12 DIAGNOSIS — R0602 Shortness of breath: Secondary | ICD-10-CM | POA: Diagnosis not present

## 2019-06-12 DIAGNOSIS — Z950 Presence of cardiac pacemaker: Secondary | ICD-10-CM | POA: Diagnosis not present

## 2019-06-12 DIAGNOSIS — R42 Dizziness and giddiness: Secondary | ICD-10-CM | POA: Diagnosis not present

## 2019-06-12 DIAGNOSIS — I10 Essential (primary) hypertension: Secondary | ICD-10-CM | POA: Diagnosis not present

## 2019-06-12 DIAGNOSIS — I482 Chronic atrial fibrillation, unspecified: Secondary | ICD-10-CM | POA: Diagnosis not present

## 2019-06-12 DIAGNOSIS — E785 Hyperlipidemia, unspecified: Secondary | ICD-10-CM | POA: Diagnosis not present

## 2019-06-12 DIAGNOSIS — E039 Hypothyroidism, unspecified: Secondary | ICD-10-CM | POA: Diagnosis not present

## 2019-06-17 DIAGNOSIS — Z950 Presence of cardiac pacemaker: Secondary | ICD-10-CM | POA: Diagnosis not present

## 2019-06-17 DIAGNOSIS — E039 Hypothyroidism, unspecified: Secondary | ICD-10-CM | POA: Diagnosis not present

## 2019-06-17 DIAGNOSIS — I482 Chronic atrial fibrillation, unspecified: Secondary | ICD-10-CM | POA: Diagnosis not present

## 2019-06-17 DIAGNOSIS — I10 Essential (primary) hypertension: Secondary | ICD-10-CM | POA: Diagnosis not present

## 2019-06-17 DIAGNOSIS — E785 Hyperlipidemia, unspecified: Secondary | ICD-10-CM | POA: Diagnosis not present

## 2019-06-17 DIAGNOSIS — I519 Heart disease, unspecified: Secondary | ICD-10-CM | POA: Diagnosis not present

## 2019-06-22 ENCOUNTER — Telehealth: Payer: Self-pay | Admitting: Family Medicine

## 2019-06-22 DIAGNOSIS — R609 Edema, unspecified: Secondary | ICD-10-CM

## 2019-06-22 DIAGNOSIS — I10 Essential (primary) hypertension: Secondary | ICD-10-CM | POA: Diagnosis not present

## 2019-06-22 DIAGNOSIS — E785 Hyperlipidemia, unspecified: Secondary | ICD-10-CM | POA: Diagnosis not present

## 2019-06-22 DIAGNOSIS — Z950 Presence of cardiac pacemaker: Secondary | ICD-10-CM | POA: Diagnosis not present

## 2019-06-22 DIAGNOSIS — E039 Hypothyroidism, unspecified: Secondary | ICD-10-CM | POA: Diagnosis not present

## 2019-06-22 DIAGNOSIS — I482 Chronic atrial fibrillation, unspecified: Secondary | ICD-10-CM | POA: Diagnosis not present

## 2019-06-22 DIAGNOSIS — I519 Heart disease, unspecified: Secondary | ICD-10-CM | POA: Diagnosis not present

## 2019-06-22 NOTE — Telephone Encounter (Signed)
April with hospice aware

## 2019-06-22 NOTE — Telephone Encounter (Signed)
Per Hospice Care, patient has lower extremity edema with both legs. Says they are red and starting to swell. Wants to know if PCP wants to prescribe him something for it. Per patient, he just got off an antibiotic for his legs.

## 2019-06-22 NOTE — Telephone Encounter (Signed)
I have printed out a prescription for compression stockings, they can either use that or if the need of verbal just give them a verbal but they can use compression stockings.  This will be the best to keep his legs from becoming infected or red or swelling.

## 2019-06-23 ENCOUNTER — Other Ambulatory Visit: Payer: Self-pay | Admitting: Family Medicine

## 2019-06-23 ENCOUNTER — Telehealth: Payer: Self-pay | Admitting: Family Medicine

## 2019-06-23 DIAGNOSIS — I10 Essential (primary) hypertension: Secondary | ICD-10-CM | POA: Diagnosis not present

## 2019-06-23 DIAGNOSIS — Z950 Presence of cardiac pacemaker: Secondary | ICD-10-CM | POA: Diagnosis not present

## 2019-06-23 DIAGNOSIS — E785 Hyperlipidemia, unspecified: Secondary | ICD-10-CM | POA: Diagnosis not present

## 2019-06-23 DIAGNOSIS — I482 Chronic atrial fibrillation, unspecified: Secondary | ICD-10-CM | POA: Diagnosis not present

## 2019-06-23 DIAGNOSIS — E039 Hypothyroidism, unspecified: Secondary | ICD-10-CM | POA: Diagnosis not present

## 2019-06-23 DIAGNOSIS — L03115 Cellulitis of right lower limb: Secondary | ICD-10-CM

## 2019-06-23 DIAGNOSIS — I519 Heart disease, unspecified: Secondary | ICD-10-CM | POA: Diagnosis not present

## 2019-06-23 MED ORDER — DOXYCYCLINE HYCLATE 100 MG PO TABS: 100.0000 mg | ORAL_TABLET | Freq: Two times a day (BID) | ORAL | 0 refills | Status: DC

## 2019-06-23 NOTE — Telephone Encounter (Signed)
It appears that he was just treated with Keflex about 2 weeks ago for cellulitis.  I am putting him on doxycycline instead.  Ideally, if he can eat with this medication this will help avoid nausea with this medicine.

## 2019-06-23 NOTE — Telephone Encounter (Signed)
Patient aware and verbalized understanding. °

## 2019-06-23 NOTE — Telephone Encounter (Signed)
Has compression stockings ordered they should be in today. But has redness bilateral ankles nurse states looking like its trying to turn into cellulitis asking for abx to be sent in

## 2019-06-23 NOTE — Telephone Encounter (Signed)
Lmtcb.

## 2019-06-24 ENCOUNTER — Ambulatory Visit (INDEPENDENT_AMBULATORY_CARE_PROVIDER_SITE_OTHER): Payer: TRICARE For Life (TFL) | Admitting: *Deleted

## 2019-06-24 DIAGNOSIS — I442 Atrioventricular block, complete: Secondary | ICD-10-CM | POA: Diagnosis not present

## 2019-06-24 LAB — CUP PACEART REMOTE DEVICE CHECK
Date Time Interrogation Session: 20210112101044
Implantable Lead Implant Date: 20090127
Implantable Lead Implant Date: 20090127
Implantable Lead Location: 753859
Implantable Lead Location: 753860
Implantable Pulse Generator Implant Date: 20190426
Pulse Gen Model: 2272
Pulse Gen Serial Number: 9019173

## 2019-06-29 ENCOUNTER — Other Ambulatory Visit: Payer: Self-pay

## 2019-06-29 ENCOUNTER — Emergency Department (HOSPITAL_COMMUNITY)
Admission: EM | Admit: 2019-06-29 | Discharge: 2019-06-29 | Disposition: A | Discharge status: Home / Self Care | Payer: Medicare Other | Attending: Emergency Medicine | Admitting: Emergency Medicine

## 2019-06-29 ENCOUNTER — Encounter (HOSPITAL_COMMUNITY): Payer: Self-pay

## 2019-06-29 ENCOUNTER — Emergency Department (HOSPITAL_COMMUNITY): Payer: Medicare Other

## 2019-06-29 DIAGNOSIS — M25551 Pain in right hip: Secondary | ICD-10-CM | POA: Insufficient documentation

## 2019-06-29 DIAGNOSIS — Z87891 Personal history of nicotine dependence: Secondary | ICD-10-CM | POA: Diagnosis not present

## 2019-06-29 DIAGNOSIS — I482 Chronic atrial fibrillation, unspecified: Secondary | ICD-10-CM | POA: Diagnosis not present

## 2019-06-29 DIAGNOSIS — R52 Pain, unspecified: Secondary | ICD-10-CM

## 2019-06-29 DIAGNOSIS — E785 Hyperlipidemia, unspecified: Secondary | ICD-10-CM | POA: Diagnosis not present

## 2019-06-29 DIAGNOSIS — N184 Chronic kidney disease, stage 4 (severe): Secondary | ICD-10-CM | POA: Insufficient documentation

## 2019-06-29 DIAGNOSIS — M25572 Pain in left ankle and joints of left foot: Secondary | ICD-10-CM | POA: Diagnosis not present

## 2019-06-29 DIAGNOSIS — R5381 Other malaise: Secondary | ICD-10-CM | POA: Diagnosis not present

## 2019-06-29 DIAGNOSIS — Z7901 Long term (current) use of anticoagulants: Secondary | ICD-10-CM | POA: Diagnosis not present

## 2019-06-29 DIAGNOSIS — E1122 Type 2 diabetes mellitus with diabetic chronic kidney disease: Secondary | ICD-10-CM | POA: Insufficient documentation

## 2019-06-29 DIAGNOSIS — E039 Hypothyroidism, unspecified: Secondary | ICD-10-CM | POA: Diagnosis not present

## 2019-06-29 DIAGNOSIS — Z79899 Other long term (current) drug therapy: Secondary | ICD-10-CM | POA: Diagnosis not present

## 2019-06-29 DIAGNOSIS — I519 Heart disease, unspecified: Secondary | ICD-10-CM | POA: Diagnosis not present

## 2019-06-29 DIAGNOSIS — M1711 Unilateral primary osteoarthritis, right knee: Secondary | ICD-10-CM | POA: Diagnosis not present

## 2019-06-29 DIAGNOSIS — M1611 Unilateral primary osteoarthritis, right hip: Secondary | ICD-10-CM | POA: Diagnosis not present

## 2019-06-29 DIAGNOSIS — Z950 Presence of cardiac pacemaker: Secondary | ICD-10-CM | POA: Diagnosis not present

## 2019-06-29 DIAGNOSIS — I129 Hypertensive chronic kidney disease with stage 1 through stage 4 chronic kidney disease, or unspecified chronic kidney disease: Secondary | ICD-10-CM | POA: Diagnosis not present

## 2019-06-29 DIAGNOSIS — I10 Essential (primary) hypertension: Secondary | ICD-10-CM | POA: Diagnosis not present

## 2019-06-29 MED ORDER — HYDROCODONE-ACETAMINOPHEN 5-325 MG PO TABS: 1.0000 | ORAL_TABLET | Freq: Once | ORAL | Status: DC | PRN

## 2019-06-29 NOTE — ED Notes (Signed)
Patient transported to X-ray 

## 2019-06-29 NOTE — ED Provider Notes (Signed)
[facility] EMERGENCY DEPARTMENT Provider Note   CSN: [id number] Arrival date & time: 06/29/19  1059     History Chief Complaint  Patient presents with  . Hip Pain    [name] is a 86 y.o. male.  HPI 86 year old male presents with right leg/hip pain.  Ongoing for about 2 weeks.  He has been unable to walk due to the degree of pain.  Has taken meds this morning for pain.  Denies any injuries.  Denies any weakness or numbness.  No back pain.  Pain is mostly lateral hip but goes down his thigh.   Past Medical History:  Diagnosis Date  . Blindness of right eye    Related to trauma in [place]  . CAD (coronary artery disease)    (non Q wave MI in Jan 2009. He had chronically ocluded right coronary artery. He had a LAD w/90% stenosis at the take off of the first diagona. He had a drug-eluding stent palced by Dr Burt Knack)  . Cancer [facility])    prostate ca with radiation in 2005  . Cellulitis of face feb 2011   prostetic rt eye, cellulitis of rt side of face  . CKD (chronic kidney disease), stage IV ([place])   . Complete heart block (HCC)    a. s/p STJ dual chamber PPM   . Diabetes mellitus without complication ([place])   . Dyslipidemia   . Hypertension   . Hypothyroid   . Macular degeneration   . Permanent atrial fibrillation [facility])     Patient Active Problem List   Diagnosis Date Noted  . Psoriasis 11/06/2018  . Palliative care by specialist   . Pressure injury of skin 02/17/2018  . Complete heart block ([place])   . Fall 02/16/2018  . History of non-ST elevation myocardial infarction (NSTEMI) 02/16/2018  . OAB (overactive bladder) 05/08/2016  . Atherosclerotic PVD with ulceration ([place]) 11/23/2013  . Chronic ulcer of right great toe ([place]) 11/23/2013  . Depression 10/02/2013  . Anxiety state 10/02/2013  . Heart burn 06/02/2013  . Chronic kidney disease (CKD), stage IV (severe) ([place]) 09/30/2012  . CAD (coronary artery disease) of artery bypass graft 09/30/2012  . Hypothyroidism  09/30/2012  . Type II diabetes mellitus with renal manifestations ([place]) 09/30/2012  . Atrial fibrillation-paroxysmal 09/12/2010  . Coronary artery disease- Status post drug-eluting stent 09/12/2010  . Hyperlipidemia 02/02/2010  . DYSPNEA ON EXERTION 02/02/2010  . Pacemaker-St. Jude 02/02/2010    Past Surgical History:  Procedure Laterality Date  . eye removed Right 1997  . INSERT / REPLACE / REMOVE PACEMAKER    . PPM GENERATOR CHANGEOUT N/A 10/04/2017   Procedure: PPM GENERATOR CHANGEOUT;  Surgeon: Deboraha Sprang, MD;  Location: [facility];  Service: Cardiovascular;  Laterality: N/A;       Family History  Problem Relation Age of Onset  . Heart disease Father     Social History   Tobacco Use  . Smoking status: Former Smoker    Packs/day: 2.00    Years: 65.00    Pack years: 130.00    Types: Cigarettes    Quit date: 09/11/2000    Years since quitting: 18.8  . Smokeless tobacco: Never Used  Substance Use Topics  . Alcohol use: Yes    Alcohol/week: 1.0 standard drinks    Types: 1 Cans of beer per week    Comment: occ  . Drug use: No    Home Medications Prior to Admission medications   Medication Sig Start  Date End Date Taking? Authorizing Provider  Carboxymethylcellulose Sodium (THERATEARS OP) Apply 1 drop to eye daily as needed (for dry eyes).     [provider]  docusate sodium (COLACE) 100 MG capsule Take 1 capsule (100 mg total) by mouth daily. 11/06/18   Dettinger, Fransisca Kaufmann, MD  doxycycline (VIBRA-TABS) 100 MG tablet Take 1 tablet (100 mg total) by mouth 2 (two) times daily. Take with food.  Please deliver. Hospice pt. 06/23/19   Ronnie Doss M, DO  ELIQUIS 2.5 MG TABS tablet TAKE 1 TABLET TWICE A DAY 04/06/19   Dettinger, Fransisca Kaufmann, MD  feeding supplement, ENSURE ENLIVE, (ENSURE ENLIVE) LIQD Take 237 mLs by mouth 3 (three) times daily between meals. 02/20/18   Regalado, Belkys A, MD  furosemide (LASIX) 20 MG tablet Take 1 tablet (20 mg total) by  mouth daily as needed for fluid or edema (Shortness of breath). 11/06/18   Dettinger, Fransisca Kaufmann, MD  glucose blood test strip Test BS BID and PRN. Dx e11.9 08/05/18   Baruch Gouty, FNP  ketoconazole (NIZORAL) 2 % shampoo Apply 1 application topically 3 (three) times a week. 02/27/19   Dettinger, Fransisca Kaufmann, MD  mirabegron ER (MYRBETRIQ) 25 MG TB24 tablet Take 1 tablet (25 mg total) by mouth 2 (two) times daily. 11/07/18   Dettinger, Fransisca Kaufmann, MD  Multiple Vitamins-Minerals (MULTIVITAMIN PO) Take 1 tablet by mouth daily.    [provider]  Multiple Vitamins-Minerals (ZINC PO) Take 1 tablet by mouth every other day.    [provider]  nitroGLYCERIN (NITROSTAT) 0.4 MG SL tablet Place 1 tablet (0.4 mg total) under the tongue every 5 (five) minutes as needed for chest pain (Do not exceed 3 doses). 09/10/16   [name], MD  sulfamethoxazole-trimethoprim (BACTRIM DS) 800-160 MG tablet Take 1 tablet by mouth 2 (two) times daily. 01/26/19   Dettinger, Fransisca Kaufmann, MD  SYNTHROID 150 MCG tablet TAKE 1 TABLET DAILY 05/05/19   Dettinger, Fransisca Kaufmann, MD  triamcinolone cream (KENALOG) 0.1 % Apply 1 application topically 2 (two) times daily. 02/26/19   Dettinger, Fransisca Kaufmann, MD    Allergies    Patient has no known allergies.  Review of Systems   Review of Systems  Musculoskeletal: Positive for arthralgias. Negative for back pain.  Neurological: Negative for weakness and numbness.  All other systems reviewed and are negative.   Physical Exam Updated Vital Signs BP (!) 154/70 (BP Location: Left Arm)   Pulse 69   Temp 98 F (36.7 C) (Oral)   Resp 18   Wt 90.7 kg   SpO2 97%   BMI 25.68 kg/m   Physical Exam Vitals and nursing note reviewed.  Constitutional:      Appearance: He is well-developed.  HENT:     Head: Normocephalic and atraumatic.     Right Ear: External ear normal.     Left Ear: External ear normal.     Nose: Nose normal.  Eyes:     General:        Right eye: No  discharge.        Left eye: No discharge.  Cardiovascular:     Rate and Rhythm: Normal rate and regular rhythm.     Pulses:          Dorsalis pedis pulses are 2+ on the right side and 2+ on the left side.  Pulmonary:     Effort: Pulmonary effort is normal.  Abdominal:     General: There is  no distension.     Palpations: Abdomen is soft.     Tenderness: There is no abdominal tenderness.  Musculoskeletal:     Cervical back: Neck supple.     Lumbar back: No tenderness.     Right hip: Tenderness (lateral) present. Decreased range of motion.     Right upper leg: No swelling or tenderness.     Right knee: No tenderness.     Comments: Normal ROM and movement of left leg. Poor effort due to pain in RLE.  Skin:    General: Skin is warm and dry.  Neurological:     Mental Status: He is alert.  Psychiatric:        Mood and Affect: Mood is not anxious.     ED Results / Procedures / Treatments   Labs (all labs ordered are listed, but only abnormal results are displayed) Labs Reviewed - No data to display  EKG None  Radiology DG Pelvis 1-2 Views  Result Date: 06/29/2019 CLINICAL DATA:  Right hip pain EXAM: PELVIS - 1 VIEW COMPARISON:  None. FINDINGS: Pelvic ring is intact. Diffuse vascular calcifications are noted. No acute fracture is seen. No soft tissue abnormality is noted. IMPRESSION: No acute abnormality seen Electronically Signed   By: Inez Catalina M.D.   On: 06/29/2019 13:24   DG Knee Complete 4 Views Right  Result Date: 06/29/2019 CLINICAL DATA:  Right hip pain. No trauma. EXAM: RIGHT KNEE - COMPLETE 4+ VIEW COMPARISON:  None. FINDINGS: No evidence of fracture, dislocation, or joint effusion. Decreased femoral tibial joint space is identified. Soft tissues are unremarkable. IMPRESSION: Mild degenerative joint changes of right knee. No acute fracture or dislocation. Electronically Signed   By: Abelardo Diesel M.D.   On: 06/29/2019 13:26   DG FEMUR, MIN 2 VIEWS RIGHT  Result  Date: 06/29/2019 CLINICAL DATA:  Right hip pain. No trauma. EXAM: RIGHT FEMUR 2 VIEWS COMPARISON:  None. FINDINGS: There is no evidence of fracture or dislocation. Narrowed right hip joint space is noted. Soft tissues are unremarkable. IMPRESSION: No acute fracture or dislocation. Degenerative joint changes of right hip. Electronically Signed   By: Abelardo Diesel M.D.   On: 06/29/2019 13:27    Procedures Procedures (including critical care time)  Medications Ordered in ED Medications  HYDROcodone-acetaminophen (NORCO/VICODIN) 5-325 MG per tablet 1 tablet (has no administration in time range)    ED Course  I have reviewed the triage vital signs and the nursing notes.  Pertinent labs & imaging results that were available during my care of the patient were reviewed by me and considered in my medical decision making (see chart for details).    MDM Rules/Calculators/A&P                      Hip and leg x-rays are negative.  This is atraumatic pain.  Vitals are okay besides some hypertension.  This could be back pain acting like sciatica.  He has significant pain but no appreciable weakness on exam.  He cannot tolerate MRIs because of pacemaker.  I discussed getting CT to help look at occult fracture or get a better exam of his spine but at this point he declines with negative x-rays and would like to go home.  He was already prescribed pain medicine by his PCP. Final Clinical Impression(s) / ED Diagnoses Final diagnoses:  Right hip pain    Rx / DC Orders ED Discharge Orders    None  Sherwood Gambler, MD 06/29/19 [id number]

## 2019-06-29 NOTE — ED Triage Notes (Signed)
Pt brought in EMS due to on going pain to right hip/leg. Pt denies fall. Pt is on hospice and Dr Psychiatrist called in hydrocodone Pt lives alone and has an Engineer, production

## 2019-06-29 NOTE — ED Notes (Signed)
Pt reports he is hurting but doesn't feel like he needs pain medication at this time. Pt states, "It's hurt, but it's tolerable". Pt was offered medication to help but he refused at this time. Pt instructed to inform RN if the pain became worse and he felt as if he needed medication.

## 2019-06-30 ENCOUNTER — Encounter: Payer: Self-pay | Admitting: Family Medicine

## 2019-06-30 ENCOUNTER — Ambulatory Visit (INDEPENDENT_AMBULATORY_CARE_PROVIDER_SITE_OTHER): Payer: Medicare Other | Admitting: Family Medicine

## 2019-06-30 ENCOUNTER — Telehealth: Payer: Self-pay | Admitting: Family Medicine

## 2019-06-30 DIAGNOSIS — M25551 Pain in right hip: Secondary | ICD-10-CM

## 2019-06-30 NOTE — Telephone Encounter (Signed)
Scheduled televisit with [facility] today at 2:05 for ER follow up.

## 2019-06-30 NOTE — Progress Notes (Signed)
Virtual Visit via telephone Note Due to COVID-19 pandemic this visit was conducted virtually. This visit type was conducted due to national recommendations for restrictions regarding the COVID-19 Pandemic (e.g. social distancing, sheltering in place) in an effort to limit this patient's exposure and mitigate transmission in our community. All issues noted in this document were discussed and addressed.  A physical exam was not performed with this format.   I connected with [name] on 06/30/2019 at 1300 by telephone and verified that I am speaking with the correct person using two identifiers. [name] is currently located at home and no one is currently with them during visit. The provider, Monia Pouch, FNP is located in their office at time of visit.  I discussed the limitations, risks, security and privacy concerns of performing an evaluation and management service by telephone and the availability of in person appointments. I also discussed with the patient that there may be a patient responsible charge related to this service. The patient expressed understanding and agreed to proceed.  Subjective:  Patient ID: [name], male    DOB: 1921/09/21, 86 y.o.   MRN: [id number]  Chief Complaint:  Hip Pain   HPI: [name] is a 86 y.o. male presenting on 06/30/2019 for Hip Pain   Pt following up for continued right hip pain. Pt was seen in the ED yesterday for increasing right hip pain and decreased ability to walk. Imaging was negative for acute changes. A CT was recommended due to the significance of his pain and negative films. Pt declined the CT while in the ED. Pt has been taking hydrocodone for the pain without relief. Pt wishes to have the CT completed because the pain is not improving. He states the pain started about 2 weeks ago and is not improving. He denies injury. States the pain is on the outside of his hip and does radiate down his thigh at times. He denies weakness,  numbness, or tingling.   Hip Pain  The incident occurred more than 1 week ago. There was no injury mechanism. The pain is present in the right hip. The pain is at a severity of 6/10. The pain is moderate. The pain has been constant since onset. Associated symptoms include an inability to bear weight. Pertinent negatives include no loss of motion, loss of sensation, muscle weakness, numbness or tingling. The symptoms are aggravated by movement, palpation and weight bearing.     Relevant past medical, surgical, family, and social history reviewed and updated as indicated.  Allergies and medications reviewed and updated.   Past Medical History:  Diagnosis Date  . Blindness of right eye    Related to trauma in [place]  . CAD (coronary artery disease)    (non Q wave MI in Jan 2009. He had chronically ocluded right coronary artery. He had a LAD w/90% stenosis at the take off of the first diagona. He had a drug-eluding stent palced by Dr Burt Knack)  . Cancer [facility])    prostate ca with radiation in 2005  . Cellulitis of face feb 2011   prostetic rt eye, cellulitis of rt side of face  . CKD (chronic kidney disease), stage IV ([place])   . Complete heart block (HCC)    a. s/p STJ dual chamber PPM   . Diabetes mellitus without complication ([place])   . Dyslipidemia   . Hypertension   . Hypothyroid   . Macular degeneration   . Permanent atrial fibrillation ([place])  Past Surgical History:  Procedure Laterality Date  . eye removed Right 1997  . INSERT / REPLACE / REMOVE PACEMAKER    . PPM GENERATOR CHANGEOUT N/A 10/04/2017   Procedure: PPM GENERATOR CHANGEOUT;  Surgeon: Deboraha Sprang, MD;  Location: [facility];  Service: Cardiovascular;  Laterality: N/A;    Social History   Socioeconomic History  . Marital status: Widowed    Spouse name: Not on file  . Number of children: Not on file  . Years of education: Not on file  . Highest education level: Not on file  Occupational History  .  Occupation: Retired    Fish farm manager: RETIRED    Comment: [employer]  . Smoking status: Former Smoker    Packs/day: 2.00    Years: 65.00    Pack years: 130.00    Types: Cigarettes    Quit date: 09/11/2000    Years since quitting: 18.8  . Smokeless tobacco: Never Used  Substance and Sexual Activity  . Alcohol use: Yes    Alcohol/week: 1.0 standard drinks    Types: 1 Cans of beer per week    Comment: occ  . Drug use: No  . Sexual activity: Not on file  Other Topics Concern  . Not on file  Social History Narrative   Married w/3 children, lives with wife.          Social Determinants of Health   Financial Resource Strain:   . Difficulty of Paying Living Expenses: Not on file  Food Insecurity:   . Worried About Charity fundraiser in the Last Year: Not on file  . Ran Out of Food in the Last Year: Not on file  Transportation Needs:   . Lack of Transportation (Medical): Not on file  . Lack of Transportation (Non-Medical): Not on file  Physical Activity:   . Days of Exercise per Week: Not on file  . Minutes of Exercise per Session: Not on file  Stress:   . Feeling of Stress : Not on file  Social Connections:   . Frequency of Communication with Friends and Family: Not on file  . Frequency of Social Gatherings with Friends and Family: Not on file  . Attends Religious Services: Not on file  . Active Member of Clubs or Organizations: Not on file  . Attends Archivist Meetings: Not on file  . Marital Status: Not on file  Intimate Partner Violence:   . Fear of Current or Ex-Partner: Not on file  . Emotionally Abused: Not on file  . Physically Abused: Not on file  . Sexually Abused: Not on file    Outpatient Encounter Medications as of 06/30/2019  Medication Sig  . Carboxymethylcellulose Sodium (THERATEARS OP) Apply 1 drop to eye daily as needed (for dry eyes).   Marland Kitchen docusate sodium (COLACE) 100 MG capsule Take 1 capsule (100 mg total) by mouth daily.  Marland Kitchen  doxycycline (VIBRA-TABS) 100 MG tablet Take 1 tablet (100 mg total) by mouth 2 (two) times daily. Take with food.  Please deliver. Hospice pt.  Marland Kitchen ELIQUIS 2.5 MG TABS tablet TAKE 1 TABLET TWICE A DAY  . feeding supplement, ENSURE ENLIVE, (ENSURE ENLIVE) LIQD Take 237 mLs by mouth 3 (three) times daily between meals.  . furosemide (LASIX) 20 MG tablet Take 1 tablet (20 mg total) by mouth daily as needed for fluid or edema (Shortness of breath).  Marland Kitchen glucose blood test strip Test BS BID and PRN. Dx e11.9  .  HYDROcodone-acetaminophen (NORCO/VICODIN) 5-325 MG tablet Take 1 tablet by mouth every 4 (four) hours as needed.  Marland Kitchen ketoconazole (NIZORAL) 2 % shampoo Apply 1 application topically 3 (three) times a week.  . mirabegron ER (MYRBETRIQ) 25 MG TB24 tablet Take 1 tablet (25 mg total) by mouth 2 (two) times daily.  . Multiple Vitamins-Minerals (MULTIVITAMIN PO) Take 1 tablet by mouth daily.  . Multiple Vitamins-Minerals (ZINC PO) Take 1 tablet by mouth every other day.  . nitroGLYCERIN (NITROSTAT) 0.4 MG SL tablet Place 1 tablet (0.4 mg total) under the tongue every 5 (five) minutes as needed for chest pain (Do not exceed 3 doses).  Marland Kitchen sulfamethoxazole-trimethoprim (BACTRIM DS) 800-160 MG tablet Take 1 tablet by mouth 2 (two) times daily.  Marland Kitchen SYNTHROID 150 MCG tablet TAKE 1 TABLET DAILY  . triamcinolone cream (KENALOG) 0.1 % Apply 1 application topically 2 (two) times daily.   No facility-administered encounter medications on file as of 06/30/2019.    No Known Allergies  Review of Systems  Constitutional: Negative for activity change, appetite change, chills, diaphoresis, fatigue, fever and unexpected weight change.  HENT: Negative.   Eyes: Negative.   Respiratory: Negative for cough, chest tightness and shortness of breath.   Cardiovascular: Negative for chest pain, palpitations and leg swelling.  Gastrointestinal: Negative for blood in stool, constipation, diarrhea, nausea and vomiting.  Endocrine:  Negative.   Genitourinary: Negative for decreased urine volume, dysuria, frequency and urgency.  Musculoskeletal: Positive for arthralgias, gait problem and myalgias.  Skin: Negative.   Allergic/Immunologic: Negative.   Neurological: Negative for dizziness, tingling, weakness, numbness and headaches.  Hematological: Negative.   Psychiatric/Behavioral: Negative for confusion, hallucinations, sleep disturbance and suicidal ideas.  All other systems reviewed and are negative.        Observations/Objective: No vital signs or physical exam, this was a telephone or virtual health encounter.  Pt alert and oriented, answers all questions appropriately, and able to speak in full sentences.    Assessment and Plan: Kadyn was seen today for hip pain.  Diagnoses and all orders for this visit:  Right hip pain Ongoing hip pain. Negative plain films in the ED yesterday. CT was recommended but pt declined. Pt agrees to CT at this time. Imaging ordered. Pt aware to report any new or worsening symptoms.  -     CT HIP RIGHT WO CONTRAST; Future     Follow Up Instructions: Return if symptoms worsen or fail to improve.    I discussed the assessment and treatment plan with the patient. The patient was provided an opportunity to ask questions and all were answered. The patient agreed with the plan and demonstrated an understanding of the instructions.   The patient was advised to call back or seek an in-person evaluation if the symptoms worsen or if the condition fails to improve as anticipated.  The above assessment and management plan was discussed with the patient. The patient verbalized understanding of and has agreed to the management plan. Patient is aware to call the clinic if they develop any new symptoms or if symptoms persist or worsen. Patient is aware when to return to the clinic for a follow-up visit. Patient educated on when it is appropriate to go to the emergency department.    I  provided 15 minutes of non-face-to-face time during this encounter. The call started at 1300. The call ended at 1315. The other time was used for coordination of care.    Monia Pouch, FNP-C [facility] [phone]  [address] [place], [place] [address] [phone] 06/30/2019

## 2019-07-01 DIAGNOSIS — I482 Chronic atrial fibrillation, unspecified: Secondary | ICD-10-CM | POA: Diagnosis not present

## 2019-07-01 DIAGNOSIS — Z950 Presence of cardiac pacemaker: Secondary | ICD-10-CM | POA: Diagnosis not present

## 2019-07-01 DIAGNOSIS — E039 Hypothyroidism, unspecified: Secondary | ICD-10-CM | POA: Diagnosis not present

## 2019-07-01 DIAGNOSIS — I10 Essential (primary) hypertension: Secondary | ICD-10-CM | POA: Diagnosis not present

## 2019-07-01 DIAGNOSIS — I519 Heart disease, unspecified: Secondary | ICD-10-CM | POA: Diagnosis not present

## 2019-07-01 DIAGNOSIS — E785 Hyperlipidemia, unspecified: Secondary | ICD-10-CM | POA: Diagnosis not present

## 2019-07-05 ENCOUNTER — Other Ambulatory Visit: Payer: Self-pay | Admitting: Family Medicine

## 2019-07-06 NOTE — Telephone Encounter (Signed)
Daughter aware and will call back to set up a video visit once MyChart is set up'

## 2019-07-06 NOTE — Telephone Encounter (Signed)
[employer]. NTBS LOV 10/2018 mail order not sent

## 2019-07-10 ENCOUNTER — Telehealth: Payer: Self-pay | Admitting: Family Medicine

## 2019-07-10 DIAGNOSIS — Z950 Presence of cardiac pacemaker: Secondary | ICD-10-CM | POA: Diagnosis not present

## 2019-07-10 DIAGNOSIS — I10 Essential (primary) hypertension: Secondary | ICD-10-CM | POA: Diagnosis not present

## 2019-07-10 DIAGNOSIS — E785 Hyperlipidemia, unspecified: Secondary | ICD-10-CM | POA: Diagnosis not present

## 2019-07-10 DIAGNOSIS — E039 Hypothyroidism, unspecified: Secondary | ICD-10-CM | POA: Diagnosis not present

## 2019-07-10 DIAGNOSIS — I519 Heart disease, unspecified: Secondary | ICD-10-CM | POA: Diagnosis not present

## 2019-07-10 DIAGNOSIS — I482 Chronic atrial fibrillation, unspecified: Secondary | ICD-10-CM | POA: Diagnosis not present

## 2019-07-10 NOTE — Telephone Encounter (Signed)
The process for getting a power wheelchair is so intense, I think he will need a face to face. Do you agree

## 2019-07-10 NOTE — Telephone Encounter (Signed)
Yes I agree he does need a face-to-face and he needs to talk to the company that he wants to get it from and have them either give him the paperwork to bring with him before or have them fax it to [place] and make sure it is here before the appointment

## 2019-07-10 NOTE — Telephone Encounter (Signed)
Paperwork is going to be sent to Dr Dettinger for patient to get a new power wheelchair and daughter wants to speak with Dr Building control surveyor about this. Says patient has an appt for this on 09/02/19 but wants to know if patient has to come in for this because he is in bad health and cant walk.

## 2019-07-10 NOTE — Telephone Encounter (Signed)
Spoke with pt's daughter and advised of provider feedback and voiced understanding.

## 2019-07-13 ENCOUNTER — Ambulatory Visit (HOSPITAL_COMMUNITY): Payer: Medicare Other

## 2019-07-13 DIAGNOSIS — E039 Hypothyroidism, unspecified: Secondary | ICD-10-CM | POA: Diagnosis not present

## 2019-07-13 DIAGNOSIS — R531 Weakness: Secondary | ICD-10-CM | POA: Diagnosis not present

## 2019-07-13 DIAGNOSIS — Z950 Presence of cardiac pacemaker: Secondary | ICD-10-CM | POA: Diagnosis not present

## 2019-07-13 DIAGNOSIS — I482 Chronic atrial fibrillation, unspecified: Secondary | ICD-10-CM | POA: Diagnosis not present

## 2019-07-13 DIAGNOSIS — R0602 Shortness of breath: Secondary | ICD-10-CM | POA: Diagnosis not present

## 2019-07-13 DIAGNOSIS — R42 Dizziness and giddiness: Secondary | ICD-10-CM | POA: Diagnosis not present

## 2019-07-13 DIAGNOSIS — I519 Heart disease, unspecified: Secondary | ICD-10-CM | POA: Diagnosis not present

## 2019-07-13 DIAGNOSIS — E785 Hyperlipidemia, unspecified: Secondary | ICD-10-CM | POA: Diagnosis not present

## 2019-07-13 DIAGNOSIS — I10 Essential (primary) hypertension: Secondary | ICD-10-CM | POA: Diagnosis not present

## 2019-07-14 ENCOUNTER — Encounter: Payer: Self-pay | Admitting: Family Medicine

## 2019-07-14 ENCOUNTER — Ambulatory Visit (INDEPENDENT_AMBULATORY_CARE_PROVIDER_SITE_OTHER): Payer: Medicare Other | Admitting: Family Medicine

## 2019-07-14 DIAGNOSIS — E1122 Type 2 diabetes mellitus with diabetic chronic kidney disease: Secondary | ICD-10-CM

## 2019-07-14 DIAGNOSIS — N184 Chronic kidney disease, stage 4 (severe): Secondary | ICD-10-CM

## 2019-07-14 DIAGNOSIS — I4821 Permanent atrial fibrillation: Secondary | ICD-10-CM

## 2019-07-14 DIAGNOSIS — E785 Hyperlipidemia, unspecified: Secondary | ICD-10-CM | POA: Diagnosis not present

## 2019-07-14 DIAGNOSIS — N3281 Overactive bladder: Secondary | ICD-10-CM | POA: Diagnosis not present

## 2019-07-14 DIAGNOSIS — E039 Hypothyroidism, unspecified: Secondary | ICD-10-CM | POA: Diagnosis not present

## 2019-07-14 MED ORDER — LEVOTHYROXINE SODIUM 150 MCG PO TABS: 150.0000 ug | ORAL_TABLET | Freq: Every day | ORAL | 3 refills | Status: DC

## 2019-07-14 MED ORDER — APIXABAN 2.5 MG PO TABS: 2.5000 mg | ORAL_TABLET | Freq: Two times a day (BID) | ORAL | 3 refills | Status: DC

## 2019-07-14 NOTE — Progress Notes (Signed)
Virtual Visit via telephone Note  I connected with [name] on 07/14/19 at 1353 by telephone and verified that I am speaking with the correct person using two identifiers. [name] is currently located at home and no other people are currently with her during visit. The provider, Fransisca Kaufmann Kinzie Wickes, MD is located in their office at time of visit.  Call ended at 1412  I discussed the limitations, risks, security and privacy concerns of performing an evaluation and management service by telephone and the availability of in person appointments. I also discussed with the patient that there may be a patient responsible charge related to this service. The patient expressed understanding and agreed to proceed.   History and Present Illness: Hypothyroidism recheck Patient is coming in for thyroid recheck today as well. They deny any issues with hair changes or heat or cold problems or diarrhea or constipation. They deny any chest pain or palpitations. They are currently on levothyroxine 176micrograms   Type 2 diabetes mellitus Patient comes in today for recheck of his diabetes. Patient has been currently taking no medication. Patient is not currently on an ACE inhibitor/ARB. Patient has not seen an ophthalmologist this year. Patient denies any issues with their feet.   chf with cad and ckd Patient has a cardiologist that manages the pacemaker.  He denies any issues with bp. He denies any palpitations.  He gets a small amount of swelling.   No diagnosis found.  Outpatient Encounter Medications as of 07/14/2019  Medication Sig  . Carboxymethylcellulose Sodium (THERATEARS OP) Apply 1 drop to eye daily as needed (for dry eyes).   Marland Kitchen docusate sodium (COLACE) 100 MG capsule Take 1 capsule (100 mg total) by mouth daily.  Marland Kitchen doxycycline (VIBRA-TABS) 100 MG tablet Take 1 tablet (100 mg total) by mouth 2 (two) times daily. Take with food.  Please deliver. Hospice pt.  Marland Kitchen ELIQUIS 2.5 MG TABS tablet  TAKE 1 TABLET TWICE A DAY  . feeding supplement, ENSURE ENLIVE, (ENSURE ENLIVE) LIQD Take 237 mLs by mouth 3 (three) times daily between meals.  . furosemide (LASIX) 20 MG tablet Take 1 tablet (20 mg total) by mouth daily as needed for fluid or edema (Shortness of breath).  Marland Kitchen glucose blood test strip Test BS BID and PRN. Dx e11.9  . HYDROcodone-acetaminophen (NORCO/VICODIN) 5-325 MG tablet Take 1 tablet by mouth every 4 (four) hours as needed.  Marland Kitchen ketoconazole (NIZORAL) 2 % shampoo Apply 1 application topically 3 (three) times a week.  . mirabegron ER (MYRBETRIQ) 25 MG TB24 tablet Take 1 tablet (25 mg total) by mouth 2 (two) times daily.  . Multiple Vitamins-Minerals (MULTIVITAMIN PO) Take 1 tablet by mouth daily.  . Multiple Vitamins-Minerals (ZINC PO) Take 1 tablet by mouth every other day.  . nitroGLYCERIN (NITROSTAT) 0.4 MG SL tablet Place 1 tablet (0.4 mg total) under the tongue every 5 (five) minutes as needed for chest pain (Do not exceed 3 doses).  Marland Kitchen sulfamethoxazole-trimethoprim (BACTRIM DS) 800-160 MG tablet Take 1 tablet by mouth 2 (two) times daily.  Marland Kitchen SYNTHROID 150 MCG tablet TAKE 1 TABLET DAILY  . triamcinolone cream (KENALOG) 0.1 % Apply 1 application topically 2 (two) times daily.   No facility-administered encounter medications on file as of 07/14/2019.    Review of Systems  Constitutional: Negative for chills and fever.  Eyes: Negative for visual disturbance.  Respiratory: Negative for shortness of breath and wheezing.   Cardiovascular: Positive for leg swelling. Negative for chest  pain.  Musculoskeletal: Negative for back pain and gait problem.  Skin: Negative for rash.  Neurological: Negative for dizziness, weakness, light-headedness and headaches.  All other systems reviewed and are negative.   Observations/Objective: Patient was calling just for refills, he does want a an electric chair and has an appointment to schedule for that, will make certain video  visit  Assessment and Plan: Problem List Items Addressed This Visit      Cardiovascular and Mediastinum   Atrial fibrillation-paroxysmal   Relevant Medications   apixaban (ELIQUIS) 2.5 MG TABS tablet     Endocrine   Hypothyroidism   Relevant Medications   levothyroxine (SYNTHROID) 150 MCG tablet   Type II diabetes mellitus with renal manifestations ([place]) - Primary     Genitourinary   Chronic kidney disease (CKD), stage IV (severe) (HCC)   OAB (overactive bladder)     Other   Hyperlipidemia   Relevant Medications   apixaban (ELIQUIS) 2.5 MG TABS tablet      Continue current medication, will see if he can get blood work through home health or hospice however he has coming out to his house.  Through palliative. Follow up plan: Return if symptoms worsen or fail to improve.     I discussed the assessment and treatment plan with the patient. The patient was provided an opportunity to ask questions and all were answered. The patient agreed with the plan and demonstrated an understanding of the instructions.   The patient was advised to call back or seek an in-person evaluation if the symptoms worsen or if the condition fails to improve as anticipated.  The above assessment and management plan was discussed with the patient. The patient verbalized understanding of and has agreed to the management plan. Patient is aware to call the clinic if symptoms persist or worsen. Patient is aware when to return to the clinic for a follow-up visit. Patient educated on when it is appropriate to go to the emergency department.    I provided 19 minutes of non-face-to-face time during this encounter.    Worthy Rancher, MD

## 2019-07-16 DIAGNOSIS — I519 Heart disease, unspecified: Secondary | ICD-10-CM | POA: Diagnosis not present

## 2019-07-16 DIAGNOSIS — I10 Essential (primary) hypertension: Secondary | ICD-10-CM | POA: Diagnosis not present

## 2019-07-16 DIAGNOSIS — E039 Hypothyroidism, unspecified: Secondary | ICD-10-CM | POA: Diagnosis not present

## 2019-07-16 DIAGNOSIS — I482 Chronic atrial fibrillation, unspecified: Secondary | ICD-10-CM | POA: Diagnosis not present

## 2019-07-16 DIAGNOSIS — Z950 Presence of cardiac pacemaker: Secondary | ICD-10-CM | POA: Diagnosis not present

## 2019-07-16 DIAGNOSIS — E785 Hyperlipidemia, unspecified: Secondary | ICD-10-CM | POA: Diagnosis not present

## 2019-07-22 ENCOUNTER — Telehealth (INDEPENDENT_AMBULATORY_CARE_PROVIDER_SITE_OTHER): Payer: Medicare Other | Admitting: Family Medicine

## 2019-07-22 ENCOUNTER — Encounter: Payer: Self-pay | Admitting: Family Medicine

## 2019-07-22 DIAGNOSIS — Z993 Dependence on wheelchair: Secondary | ICD-10-CM | POA: Diagnosis not present

## 2019-07-22 DIAGNOSIS — R296 Repeated falls: Secondary | ICD-10-CM

## 2019-07-22 NOTE — Progress Notes (Signed)
Virtual Visit via MyChart video note  I connected with [name] on 07/22/19 at 1113 by video and verified that I am speaking with the correct person using two identifiers. [name] is currently located at home and daughter are currently with her during visit. The provider, Fransisca Kaufmann Deondrea Markos, MD is located in their office at time of visit.  Call ended at 71  I discussed the limitations, risks, security and privacy concerns of performing an evaluation and management service by video and the availability of in person appointments. I also discussed with the patient that there may be a patient responsible charge related to this service. The patient expressed understanding and agreed to proceed.   History and Present Illness: Patient is calling in for evaluation for wheelchair. Patient he is wheelchair bound and cannot walk.  Patient has a lot of weakness and heart disease and cannot control a wheelchair due to weakness and decreased strength. He lives in a small apartment and a scooter would be too large.  Patient is legally blind and only sees shadows. A motorized wheelchair will greatly improve his ability to perform simple adl's such as make it to the restroom on time and not fall or have bowel or bladder accidents. The EMs has had to some to the residence 3-4 times to help him off of the floor.  Patient does currently have an old motorized wheelchair that they had from his mother that helps really easy and sometimes has led to his falls and that is why he needs one that is for him that is a little wider and multipara so that he does not end up on the floor.  The motorized wheelchair that he has currently has increased his mobility and functionality greatly and he would like to have one that fits his size  1. Wheelchair bound   2. Recurrent falls     Outpatient Encounter Medications as of 07/22/2019  Medication Sig  . apixaban (ELIQUIS) 2.5 MG TABS tablet Take 1 tablet (2.5 mg total)  by mouth 2 (two) times daily.  . Carboxymethylcellulose Sodium (THERATEARS OP) Apply 1 drop to eye daily as needed (for dry eyes).   Marland Kitchen docusate sodium (COLACE) 100 MG capsule Take 1 capsule (100 mg total) by mouth daily.  . feeding supplement, ENSURE ENLIVE, (ENSURE ENLIVE) LIQD Take 237 mLs by mouth 3 (three) times daily between meals.  . furosemide (LASIX) 20 MG tablet Take 1 tablet (20 mg total) by mouth daily as needed for fluid or edema (Shortness of breath).  Marland Kitchen glucose blood test strip Test BS BID and PRN. Dx e11.9  . HYDROcodone-acetaminophen (NORCO/VICODIN) 5-325 MG tablet Take 1 tablet by mouth every 4 (four) hours as needed.  Marland Kitchen ketoconazole (NIZORAL) 2 % shampoo Apply 1 application topically 3 (three) times a week.  . levothyroxine (SYNTHROID) 150 MCG tablet Take 1 tablet (150 mcg total) by mouth daily.  . mirabegron ER (MYRBETRIQ) 25 MG TB24 tablet Take 1 tablet (25 mg total) by mouth 2 (two) times daily.  . Multiple Vitamins-Minerals (MULTIVITAMIN PO) Take 1 tablet by mouth daily.  . Multiple Vitamins-Minerals (ZINC PO) Take 1 tablet by mouth every other day.  . nitroGLYCERIN (NITROSTAT) 0.4 MG SL tablet Place 1 tablet (0.4 mg total) under the tongue every 5 (five) minutes as needed for chest pain (Do not exceed 3 doses).  . triamcinolone cream (KENALOG) 0.1 % Apply 1 application topically 2 (two) times daily.   No facility-administered encounter medications on  file as of 07/22/2019.    Review of Systems  Constitutional: Negative for chills and fever.  Eyes: Positive for visual disturbance.  Respiratory: Positive for shortness of breath. Negative for wheezing.   Cardiovascular: Negative for chest pain and leg swelling.  Musculoskeletal: Negative for gait problem.  Skin: Negative for rash.  Neurological: Positive for weakness and light-headedness. Negative for dizziness.  All other systems reviewed and are negative.   Observations/Objective: Patient sounds comfortable and in  no acute distress  Assessment and Plan: Problem List Items Addressed This Visit    None    Visit Diagnoses    Wheelchair bound    -  Primary   Recurrent falls          Will fill out paperwork and a prescription for a motorized wheelchair from the patient.  This will help his mobility and being able to make it to the restroom and not have falls and not tipping over.  He cannot make it quick enough in a standard wheelchair because of his shortness of breath and decreased strength and weakness.  He would benefit greatly in basic functionality and ADLs with a motorized wheelchair around the house Follow up plan: Return if symptoms worsen or fail to improve.     I discussed the assessment and treatment plan with the patient. The patient was provided an opportunity to ask questions and all were answered. The patient agreed with the plan and demonstrated an understanding of the instructions.   The patient was advised to call back or seek an in-person evaluation if the symptoms worsen or if the condition fails to improve as anticipated.  The above assessment and management plan was discussed with the patient. The patient verbalized understanding of and has agreed to the management plan. Patient is aware to call the clinic if symptoms persist or worsen. Patient is aware when to return to the clinic for a follow-up visit. Patient educated on when it is appropriate to go to the emergency department.    I provided 17 minutes of non-face-to-face time during this encounter.    Worthy Rancher, MD

## 2019-07-23 ENCOUNTER — Telehealth: Payer: Self-pay | Admitting: Family Medicine

## 2019-07-23 DIAGNOSIS — I10 Essential (primary) hypertension: Secondary | ICD-10-CM | POA: Diagnosis not present

## 2019-07-23 DIAGNOSIS — E039 Hypothyroidism, unspecified: Secondary | ICD-10-CM | POA: Diagnosis not present

## 2019-07-23 DIAGNOSIS — E785 Hyperlipidemia, unspecified: Secondary | ICD-10-CM | POA: Diagnosis not present

## 2019-07-23 DIAGNOSIS — Z950 Presence of cardiac pacemaker: Secondary | ICD-10-CM | POA: Diagnosis not present

## 2019-07-23 DIAGNOSIS — I482 Chronic atrial fibrillation, unspecified: Secondary | ICD-10-CM | POA: Diagnosis not present

## 2019-07-23 DIAGNOSIS — I519 Heart disease, unspecified: Secondary | ICD-10-CM | POA: Diagnosis not present

## 2019-07-23 NOTE — Telephone Encounter (Signed)
Aware of lab orders per pcp.

## 2019-07-23 NOTE — Telephone Encounter (Signed)
Sounds good, you can tell her to do a CBC, a chemistry panel and an A1c and a TSH

## 2019-07-23 NOTE — Telephone Encounter (Signed)
Please advise on orders for lab?

## 2019-07-28 DIAGNOSIS — E785 Hyperlipidemia, unspecified: Secondary | ICD-10-CM | POA: Diagnosis not present

## 2019-07-28 DIAGNOSIS — I482 Chronic atrial fibrillation, unspecified: Secondary | ICD-10-CM | POA: Diagnosis not present

## 2019-07-28 DIAGNOSIS — I10 Essential (primary) hypertension: Secondary | ICD-10-CM | POA: Diagnosis not present

## 2019-07-28 DIAGNOSIS — E039 Hypothyroidism, unspecified: Secondary | ICD-10-CM | POA: Diagnosis not present

## 2019-07-28 DIAGNOSIS — Z950 Presence of cardiac pacemaker: Secondary | ICD-10-CM | POA: Diagnosis not present

## 2019-07-28 DIAGNOSIS — I519 Heart disease, unspecified: Secondary | ICD-10-CM | POA: Diagnosis not present

## 2019-07-31 DIAGNOSIS — I482 Chronic atrial fibrillation, unspecified: Secondary | ICD-10-CM | POA: Diagnosis not present

## 2019-07-31 DIAGNOSIS — E039 Hypothyroidism, unspecified: Secondary | ICD-10-CM | POA: Diagnosis not present

## 2019-07-31 DIAGNOSIS — Z950 Presence of cardiac pacemaker: Secondary | ICD-10-CM | POA: Diagnosis not present

## 2019-07-31 DIAGNOSIS — I10 Essential (primary) hypertension: Secondary | ICD-10-CM | POA: Diagnosis not present

## 2019-07-31 DIAGNOSIS — E785 Hyperlipidemia, unspecified: Secondary | ICD-10-CM | POA: Diagnosis not present

## 2019-07-31 DIAGNOSIS — I519 Heart disease, unspecified: Secondary | ICD-10-CM | POA: Diagnosis not present

## 2019-08-05 DIAGNOSIS — I519 Heart disease, unspecified: Secondary | ICD-10-CM | POA: Diagnosis not present

## 2019-08-05 DIAGNOSIS — E785 Hyperlipidemia, unspecified: Secondary | ICD-10-CM | POA: Diagnosis not present

## 2019-08-05 DIAGNOSIS — Z950 Presence of cardiac pacemaker: Secondary | ICD-10-CM | POA: Diagnosis not present

## 2019-08-05 DIAGNOSIS — E039 Hypothyroidism, unspecified: Secondary | ICD-10-CM | POA: Diagnosis not present

## 2019-08-05 DIAGNOSIS — I482 Chronic atrial fibrillation, unspecified: Secondary | ICD-10-CM | POA: Diagnosis not present

## 2019-08-05 DIAGNOSIS — I10 Essential (primary) hypertension: Secondary | ICD-10-CM | POA: Diagnosis not present

## 2019-08-10 DIAGNOSIS — I10 Essential (primary) hypertension: Secondary | ICD-10-CM | POA: Diagnosis not present

## 2019-08-10 DIAGNOSIS — R42 Dizziness and giddiness: Secondary | ICD-10-CM | POA: Diagnosis not present

## 2019-08-10 DIAGNOSIS — I519 Heart disease, unspecified: Secondary | ICD-10-CM | POA: Diagnosis not present

## 2019-08-10 DIAGNOSIS — E039 Hypothyroidism, unspecified: Secondary | ICD-10-CM | POA: Diagnosis not present

## 2019-08-10 DIAGNOSIS — I482 Chronic atrial fibrillation, unspecified: Secondary | ICD-10-CM | POA: Diagnosis not present

## 2019-08-10 DIAGNOSIS — R0602 Shortness of breath: Secondary | ICD-10-CM | POA: Diagnosis not present

## 2019-08-10 DIAGNOSIS — E785 Hyperlipidemia, unspecified: Secondary | ICD-10-CM | POA: Diagnosis not present

## 2019-08-10 DIAGNOSIS — Z950 Presence of cardiac pacemaker: Secondary | ICD-10-CM | POA: Diagnosis not present

## 2019-08-10 DIAGNOSIS — R531 Weakness: Secondary | ICD-10-CM | POA: Diagnosis not present

## 2019-08-13 DIAGNOSIS — I519 Heart disease, unspecified: Secondary | ICD-10-CM | POA: Diagnosis not present

## 2019-08-13 DIAGNOSIS — I482 Chronic atrial fibrillation, unspecified: Secondary | ICD-10-CM | POA: Diagnosis not present

## 2019-08-13 DIAGNOSIS — E039 Hypothyroidism, unspecified: Secondary | ICD-10-CM | POA: Diagnosis not present

## 2019-08-13 DIAGNOSIS — Z950 Presence of cardiac pacemaker: Secondary | ICD-10-CM | POA: Diagnosis not present

## 2019-08-13 DIAGNOSIS — E785 Hyperlipidemia, unspecified: Secondary | ICD-10-CM | POA: Diagnosis not present

## 2019-08-13 DIAGNOSIS — I10 Essential (primary) hypertension: Secondary | ICD-10-CM | POA: Diagnosis not present

## 2019-08-20 ENCOUNTER — Other Ambulatory Visit: Payer: Self-pay | Admitting: *Deleted

## 2019-08-20 MED ORDER — LEVOTHYROXINE SODIUM 150 MCG PO TABS: 150.0000 ug | ORAL_TABLET | Freq: Every day | ORAL | 3 refills | Status: DC

## 2019-08-21 ENCOUNTER — Other Ambulatory Visit: Payer: Self-pay | Admitting: *Deleted

## 2019-08-21 DIAGNOSIS — I482 Chronic atrial fibrillation, unspecified: Secondary | ICD-10-CM | POA: Diagnosis not present

## 2019-08-21 DIAGNOSIS — E039 Hypothyroidism, unspecified: Secondary | ICD-10-CM | POA: Diagnosis not present

## 2019-08-21 DIAGNOSIS — I519 Heart disease, unspecified: Secondary | ICD-10-CM | POA: Diagnosis not present

## 2019-08-21 DIAGNOSIS — I10 Essential (primary) hypertension: Secondary | ICD-10-CM | POA: Diagnosis not present

## 2019-08-21 DIAGNOSIS — Z950 Presence of cardiac pacemaker: Secondary | ICD-10-CM | POA: Diagnosis not present

## 2019-08-21 DIAGNOSIS — E785 Hyperlipidemia, unspecified: Secondary | ICD-10-CM | POA: Diagnosis not present

## 2019-08-21 MED ORDER — APIXABAN 2.5 MG PO TABS: 2.5000 mg | ORAL_TABLET | Freq: Two times a day (BID) | ORAL | 0 refills | Status: DC

## 2019-08-24 ENCOUNTER — Telehealth: Payer: Self-pay | Admitting: Family Medicine

## 2019-08-24 NOTE — Telephone Encounter (Signed)
Aware ppw was faxed back on Friday

## 2019-08-24 NOTE — Telephone Encounter (Signed)
  Medication Request  08/24/2019  What is the name of the medication? Electric wheelchair  Have you contacted your pharmacy to request a refill? Yes, they are waiting on papers from [place]  Which pharmacy would you like this sent to[facility]   Patient notified that their request is being sent to the clinical staff for review and that they should receive a call once it is complete. If they do not receive a call within 24 hours they can check with their pharmacy or our office.

## 2019-08-25 DIAGNOSIS — I10 Essential (primary) hypertension: Secondary | ICD-10-CM | POA: Diagnosis not present

## 2019-08-25 DIAGNOSIS — E785 Hyperlipidemia, unspecified: Secondary | ICD-10-CM | POA: Diagnosis not present

## 2019-08-25 DIAGNOSIS — I482 Chronic atrial fibrillation, unspecified: Secondary | ICD-10-CM | POA: Diagnosis not present

## 2019-08-25 DIAGNOSIS — E039 Hypothyroidism, unspecified: Secondary | ICD-10-CM | POA: Diagnosis not present

## 2019-08-25 DIAGNOSIS — I519 Heart disease, unspecified: Secondary | ICD-10-CM | POA: Diagnosis not present

## 2019-08-25 DIAGNOSIS — Z950 Presence of cardiac pacemaker: Secondary | ICD-10-CM | POA: Diagnosis not present

## 2019-08-27 DIAGNOSIS — I482 Chronic atrial fibrillation, unspecified: Secondary | ICD-10-CM | POA: Diagnosis not present

## 2019-08-27 DIAGNOSIS — E785 Hyperlipidemia, unspecified: Secondary | ICD-10-CM | POA: Diagnosis not present

## 2019-08-27 DIAGNOSIS — I519 Heart disease, unspecified: Secondary | ICD-10-CM | POA: Diagnosis not present

## 2019-08-27 DIAGNOSIS — Z950 Presence of cardiac pacemaker: Secondary | ICD-10-CM | POA: Diagnosis not present

## 2019-08-27 DIAGNOSIS — I10 Essential (primary) hypertension: Secondary | ICD-10-CM | POA: Diagnosis not present

## 2019-08-27 DIAGNOSIS — E039 Hypothyroidism, unspecified: Secondary | ICD-10-CM | POA: Diagnosis not present

## 2019-09-02 ENCOUNTER — Ambulatory Visit: Payer: Medicare Other | Admitting: Family Medicine

## 2019-09-03 DIAGNOSIS — I482 Chronic atrial fibrillation, unspecified: Secondary | ICD-10-CM | POA: Diagnosis not present

## 2019-09-03 DIAGNOSIS — I519 Heart disease, unspecified: Secondary | ICD-10-CM | POA: Diagnosis not present

## 2019-09-03 DIAGNOSIS — E039 Hypothyroidism, unspecified: Secondary | ICD-10-CM | POA: Diagnosis not present

## 2019-09-03 DIAGNOSIS — Z950 Presence of cardiac pacemaker: Secondary | ICD-10-CM | POA: Diagnosis not present

## 2019-09-03 DIAGNOSIS — I10 Essential (primary) hypertension: Secondary | ICD-10-CM | POA: Diagnosis not present

## 2019-09-03 DIAGNOSIS — E785 Hyperlipidemia, unspecified: Secondary | ICD-10-CM | POA: Diagnosis not present

## 2019-09-07 DIAGNOSIS — I482 Chronic atrial fibrillation, unspecified: Secondary | ICD-10-CM | POA: Diagnosis not present

## 2019-09-07 DIAGNOSIS — Z950 Presence of cardiac pacemaker: Secondary | ICD-10-CM | POA: Diagnosis not present

## 2019-09-07 DIAGNOSIS — E785 Hyperlipidemia, unspecified: Secondary | ICD-10-CM | POA: Diagnosis not present

## 2019-09-07 DIAGNOSIS — I519 Heart disease, unspecified: Secondary | ICD-10-CM | POA: Diagnosis not present

## 2019-09-07 DIAGNOSIS — E039 Hypothyroidism, unspecified: Secondary | ICD-10-CM | POA: Diagnosis not present

## 2019-09-07 DIAGNOSIS — I10 Essential (primary) hypertension: Secondary | ICD-10-CM | POA: Diagnosis not present

## 2019-09-19 ENCOUNTER — Other Ambulatory Visit: Payer: Self-pay | Admitting: Family Medicine

## 2019-09-21 NOTE — Telephone Encounter (Signed)
Stacks. Needs labwork. Last TSH 10/2018 mail order sent

## 2019-09-23 ENCOUNTER — Ambulatory Visit (INDEPENDENT_AMBULATORY_CARE_PROVIDER_SITE_OTHER): Payer: Medicare Other | Admitting: *Deleted

## 2019-09-23 DIAGNOSIS — I442 Atrioventricular block, complete: Secondary | ICD-10-CM | POA: Diagnosis not present

## 2019-09-23 LAB — CUP PACEART REMOTE DEVICE CHECK
Battery Remaining Longevity: 108 mo
Battery Remaining Percentage: 95.5 %
Battery Voltage: 2.99 V
Brady Statistic RV Percent Paced: 98 %
Date Time Interrogation Session: 20210414020013
Implantable Lead Implant Date: 20090127
Implantable Lead Implant Date: 20090127
Implantable Lead Location: 753859
Implantable Lead Location: 753860
Implantable Pulse Generator Implant Date: 20190426
Lead Channel Impedance Value: 390 Ohm
Lead Channel Pacing Threshold Amplitude: 1 V
Lead Channel Pacing Threshold Pulse Width: 0.5 ms
Lead Channel Sensing Intrinsic Amplitude: 7.3 mV
Lead Channel Setting Pacing Amplitude: 2.5 V
Lead Channel Setting Pacing Pulse Width: 0.5 ms
Lead Channel Setting Sensing Sensitivity: 2.5 mV
Pulse Gen Model: 2272
Pulse Gen Serial Number: 9019173

## 2019-09-23 NOTE — Progress Notes (Signed)
PPM Remote  

## 2019-10-28 ENCOUNTER — Ambulatory Visit (INDEPENDENT_AMBULATORY_CARE_PROVIDER_SITE_OTHER): Payer: Medicare Other | Admitting: Family Medicine

## 2019-10-28 ENCOUNTER — Other Ambulatory Visit: Payer: Self-pay

## 2019-10-28 ENCOUNTER — Encounter: Payer: Self-pay | Admitting: Family Medicine

## 2019-10-28 VITALS — BP 151/62 | HR 72 | Temp 97.6°F | Ht 74.0 in

## 2019-10-28 DIAGNOSIS — H6123 Impacted cerumen, bilateral: Secondary | ICD-10-CM

## 2019-10-28 NOTE — Progress Notes (Signed)
   BP (!) 151/62   Pulse 72   Temp 97.6 F (36.4 C) (Temporal)   Ht 6\' 2"  (1.88 m)   BMI 25.68 kg/m    Subjective:   Patient ID: [name], male    DOB: 1921/12/11, 86 y.o.   MRN: [id number]  HPI: [name] is a 86 y.o. male presenting on 10/28/2019 for Hearing Problem (pt here today c/o not being able to hear for about a week and debrox drops aren't working)   HPI Patient is coming in for hearing problems.  He is brought in by his aide and she says that he cannot hear anything which is much worse than he has been.  She says that it has been very frustrating because he usually can hear more than what he is hearing and over the past few weeks its been especially bad.  She says that they have been doing Debrox drops over a week and it has not been helping.  Relevant past medical, surgical, family and social history reviewed and updated as indicated. Interim medical history since our last visit reviewed. Allergies and medications reviewed and updated.  Review of Systems  Constitutional: Negative for chills and fever.  HENT: Positive for hearing loss. Negative for ear discharge and ear pain.   Respiratory: Negative for shortness of breath and wheezing.   Cardiovascular: Negative for chest pain and leg swelling.  Musculoskeletal: Negative for back pain and gait problem.  Skin: Negative for rash.  All other systems reviewed and are negative.   Per HPI unless specifically indicated above      Objective:   BP (!) 151/62   Pulse 72   Temp 97.6 F (36.4 C) (Temporal)   Ht 6\' 2"  (1.88 m)   BMI 25.68 kg/m   Wt Readings from Last 3 Encounters:  06/29/19 200 lb (90.7 kg)  11/06/18 199 lb 6.4 oz (90.4 kg)  02/20/18 192 lb (87.1 kg)    Physical Exam Vitals and nursing note reviewed.  Constitutional:      Appearance: Normal appearance.  HENT:     Right Ear: There is impacted cerumen.     Left Ear: There is impacted cerumen.  Neurological:     Mental Status: He is  alert.       Assessment & Plan:   Problem List Items Addressed This Visit    None    Visit Diagnoses    Bilateral hearing loss due to cerumen impaction    -  Primary    Cerumen lavage: Nurse to lavage, patient tolerated well, one half to see if hearing improves after this.  Follow up plan: Return if symptoms worsen or fail to improve.  Counseling provided for all of the vaccine components No orders of the defined types were placed in this encounter.   Carlos Pina, MD [facility] 10/28/2019, 10:42 AM

## 2019-11-01 ENCOUNTER — Other Ambulatory Visit: Payer: Self-pay | Admitting: Family Medicine

## 2019-11-02 ENCOUNTER — Encounter (INDEPENDENT_AMBULATORY_CARE_PROVIDER_SITE_OTHER): Payer: Self-pay | Admitting: Otolaryngology

## 2019-11-02 ENCOUNTER — Other Ambulatory Visit: Payer: Self-pay

## 2019-11-02 ENCOUNTER — Ambulatory Visit (INDEPENDENT_AMBULATORY_CARE_PROVIDER_SITE_OTHER): Payer: Medicare Other | Admitting: Otolaryngology

## 2019-11-02 VITALS — Temp 97.3°F

## 2019-11-02 DIAGNOSIS — H903 Sensorineural hearing loss, bilateral: Secondary | ICD-10-CM | POA: Diagnosis not present

## 2019-11-02 DIAGNOSIS — H6123 Impacted cerumen, bilateral: Secondary | ICD-10-CM

## 2019-11-02 NOTE — Progress Notes (Signed)
HPI: [name] is a 86 y.o. male who presents for evaluation of wax buildup in his ears.  He was last seen in September of last year.  He wears bilateral hearing aids but has not been wearing them recently..  Past Medical History:  Diagnosis Date  . Blindness of right eye    Related to trauma in [place]  . CAD (coronary artery disease)    (non Q wave MI in Jan 2009. He had chronically ocluded right coronary artery. He had a LAD w/90% stenosis at the take off of the first diagona. He had a drug-eluding stent palced by Dr Burt Knack)  . Cancer [facility])    prostate ca with radiation in 2005  . Cellulitis of face feb 2011   prostetic rt eye, cellulitis of rt side of face  . CKD (chronic kidney disease), stage IV ([place])   . Complete heart block (HCC)    a. s/p STJ dual chamber PPM   . Diabetes mellitus without complication ([place])   . Dyslipidemia   . Hypertension   . Hypothyroid   . Macular degeneration   . Permanent atrial fibrillation [facility])    Past Surgical History:  Procedure Laterality Date  . eye removed Right 1997  . INSERT / REPLACE / REMOVE PACEMAKER    . PPM GENERATOR CHANGEOUT N/A 10/04/2017   Procedure: PPM GENERATOR CHANGEOUT;  Surgeon: Deboraha Sprang, MD;  Location: [facility];  Service: Cardiovascular;  Laterality: N/A;   Social History   Socioeconomic History  . Marital status: Widowed    Spouse name: Not on file  . Number of children: Not on file  . Years of education: Not on file  . Highest education level: Not on file  Occupational History  . Occupation: Retired    Fish farm manager: RETIRED    Comment: [employer]  . Smoking status: Former Smoker    Packs/day: 2.00    Years: 65.00    Pack years: 130.00    Types: Cigarettes    Quit date: 09/11/2000    Years since quitting: 19.1  . Smokeless tobacco: Never Used  Substance and Sexual Activity  . Alcohol use: Yes    Alcohol/week: 1.0 standard drinks    Types: 1 Cans of beer per week    Comment:  occ  . Drug use: No  . Sexual activity: Not on file  Other Topics Concern  . Not on file  Social History Narrative   Married w/3 children, lives with wife.          Social Determinants of Health   Financial Resource Strain:   . Difficulty of Paying Living Expenses:   Food Insecurity:   . Worried About Charity fundraiser in the Last Year:   . Arboriculturist in the Last Year:   Transportation Needs:   . Film/video editor (Medical):   Marland Kitchen Lack of Transportation (Non-Medical):   Physical Activity:   . Days of Exercise per Week:   . Minutes of Exercise per Session:   Stress:   . Feeling of Stress :   Social Connections:   . Frequency of Communication with Friends and Family:   . Frequency of Social Gatherings with Friends and Family:   . Attends Religious Services:   . Active Member of Clubs or Organizations:   . Attends Archivist Meetings:   Marland Kitchen Marital Status:    Family History  Problem Relation Age of Onset  .  Heart disease Father    No Known Allergies Prior to Admission medications   Medication Sig Start Date End Date Taking? Authorizing Provider  docusate sodium (COLACE) 100 MG capsule Take 1 capsule (100 mg total) by mouth daily. 11/06/18  Yes Dettinger, Fransisca Kaufmann, MD  ELIQUIS 2.5 MG TABS tablet TAKE 1 TABLET TWICE A DAY 11/02/19  Yes Dettinger, Fransisca Kaufmann, MD  feeding supplement, ENSURE ENLIVE, (ENSURE ENLIVE) LIQD Take 237 mLs by mouth 3 (three) times daily between meals. 02/20/18  Yes Regalado, Belkys A, MD  furosemide (LASIX) 20 MG tablet Take 1 tablet (20 mg total) by mouth daily as needed for fluid or edema (Shortness of breath). 11/06/18  Yes Dettinger, Fransisca Kaufmann, MD  glucose blood test strip Test BS BID and PRN. Dx e11.9 08/05/18  Yes Rakes, Connye Burkitt, FNP  ketoconazole (NIZORAL) 2 % shampoo Apply 1 application topically 3 (three) times a week. 02/27/19  Yes Dettinger, Fransisca Kaufmann, MD  mirabegron ER (MYRBETRIQ) 25 MG TB24 tablet Take 1 tablet (25 mg total) by  mouth 2 (two) times daily. 11/07/18  Yes Dettinger, Fransisca Kaufmann, MD  Multiple Vitamins-Minerals (MULTIVITAMIN PO) Take 1 tablet by mouth daily.   Yes [provider]  Multiple Vitamins-Minerals (ZINC PO) Take 1 tablet by mouth every other day.   Yes [provider]  nitroGLYCERIN (NITROSTAT) 0.4 MG SL tablet Place 1 tablet (0.4 mg total) under the tongue every 5 (five) minutes as needed for chest pain (Do not exceed 3 doses). 09/10/16  Yes [name], MD  SYNTHROID 150 MCG tablet TAKE 1 TABLET DAILY 09/21/19  Yes Claretta Fraise, MD  temazepam (RESTORIL) 15 MG capsule Take 15 mg by mouth at bedtime as needed. 07/29/19  Yes [provider]  triamcinolone cream (KENALOG) 0.1 % Apply 1 application topically 2 (two) times daily. 02/26/19  Yes Dettinger, Fransisca Kaufmann, MD     Positive ROS: Otherwise negative  All other systems have been reviewed and were otherwise negative with the exception of those mentioned in the HPI and as above.  Physical Exam: Constitutional: Alert, well-appearing, no acute distress Ears: External ears without lesions or tenderness. Ear canals are completely occluded with cerumen bilaterally.  This was removed in the office using forceps and suction.  TMs were clear bilaterally.. Nasal: External nose without lesions. Clear nasal passages Oral: Oropharynx clear. Neck: No palpable adenopathy or masses Respiratory: Breathing comfortably  Skin: No facial/neck lesions or rash noted.  Cerumen impaction removal  Date/Time: 11/02/2019 5:55 PM Performed by: Rozetta Nunnery, MD Authorized by: Rozetta Nunnery, MD   Consent:    Consent obtained:  Verbal   Consent given by:  Patient   Risks discussed:  Pain and bleeding Procedure details:    Location:  L ear and R ear   Procedure type: curette, suction and forceps   Post-procedure details:    Inspection:  TM intact and canal normal   Hearing quality:  Improved   Patient tolerance of  procedure:  Tolerated well, no immediate complications Comments:     TMs are clear bilaterally.    Assessment: Bilateral cerumen impactions. Bilateral SNHL.  Patient has hearing aids but has not been wearing them.  Plan: Would recommend use of his hearing aids. He will follow-up in 8 months for recheck and cleaning.  Radene Journey, MD

## 2019-11-13 ENCOUNTER — Other Ambulatory Visit: Payer: Self-pay | Admitting: Family Medicine

## 2019-11-13 DIAGNOSIS — N3281 Overactive bladder: Secondary | ICD-10-CM

## 2019-11-19 ENCOUNTER — Ambulatory Visit: Payer: Medicare Other | Admitting: Family Medicine

## 2019-12-23 ENCOUNTER — Ambulatory Visit (INDEPENDENT_AMBULATORY_CARE_PROVIDER_SITE_OTHER): Payer: Medicare Other | Admitting: *Deleted

## 2019-12-23 DIAGNOSIS — I442 Atrioventricular block, complete: Secondary | ICD-10-CM | POA: Diagnosis not present

## 2019-12-24 LAB — CUP PACEART REMOTE DEVICE CHECK
Battery Remaining Longevity: 107 mo
Battery Remaining Percentage: 95.5 %
Battery Voltage: 2.99 V
Brady Statistic RV Percent Paced: 98 %
Date Time Interrogation Session: 20210714124022
Implantable Lead Implant Date: 20090127
Implantable Lead Implant Date: 20090127
Implantable Lead Location: 753859
Implantable Lead Location: 753860
Implantable Pulse Generator Implant Date: 20190426
Lead Channel Impedance Value: 380 Ohm
Lead Channel Pacing Threshold Amplitude: 1 V
Lead Channel Pacing Threshold Pulse Width: 0.5 ms
Lead Channel Sensing Intrinsic Amplitude: 7.8 mV
Lead Channel Setting Pacing Amplitude: 2.5 V
Lead Channel Setting Pacing Pulse Width: 0.5 ms
Lead Channel Setting Sensing Sensitivity: 2.5 mV
Pulse Gen Model: 2272
Pulse Gen Serial Number: 9019173

## 2019-12-24 NOTE — Progress Notes (Signed)
Remote pacemaker transmission.   

## 2020-01-06 DIAGNOSIS — S4991XA Unspecified injury of right shoulder and upper arm, initial encounter: Secondary | ICD-10-CM | POA: Diagnosis not present

## 2020-01-06 DIAGNOSIS — M79621 Pain in right upper arm: Secondary | ICD-10-CM | POA: Diagnosis not present

## 2020-01-06 DIAGNOSIS — S46211A Strain of muscle, fascia and tendon of other parts of biceps, right arm, initial encounter: Secondary | ICD-10-CM | POA: Diagnosis not present

## 2020-03-05 ENCOUNTER — Other Ambulatory Visit: Payer: Self-pay | Admitting: Family Medicine

## 2020-03-05 DIAGNOSIS — N3281 Overactive bladder: Secondary | ICD-10-CM

## 2020-03-07 NOTE — Telephone Encounter (Signed)
Dettinger NTBS LOV 07/14/19

## 2020-03-07 NOTE — Telephone Encounter (Signed)
Caregiver aware.

## 2020-03-23 ENCOUNTER — Ambulatory Visit (INDEPENDENT_AMBULATORY_CARE_PROVIDER_SITE_OTHER): Payer: Medicare Other

## 2020-03-23 DIAGNOSIS — I442 Atrioventricular block, complete: Secondary | ICD-10-CM

## 2020-03-26 LAB — CUP PACEART REMOTE DEVICE CHECK
Battery Remaining Longevity: 108 mo
Battery Remaining Percentage: 95.5 %
Battery Voltage: 2.99 V
Brady Statistic RV Percent Paced: 98 %
Date Time Interrogation Session: 20211014114026
Implantable Lead Implant Date: 20090127
Implantable Lead Implant Date: 20090127
Implantable Lead Location: 753859
Implantable Lead Location: 753860
Implantable Pulse Generator Implant Date: 20190426
Lead Channel Impedance Value: 390 Ohm
Lead Channel Pacing Threshold Amplitude: 1 V
Lead Channel Pacing Threshold Pulse Width: 0.5 ms
Lead Channel Sensing Intrinsic Amplitude: 8.4 mV
Lead Channel Setting Pacing Amplitude: 2.5 V
Lead Channel Setting Pacing Pulse Width: 0.5 ms
Lead Channel Setting Sensing Sensitivity: 2.5 mV
Pulse Gen Model: 2272
Pulse Gen Serial Number: 9019173

## 2020-03-28 DIAGNOSIS — R4020 Unspecified coma: Secondary | ICD-10-CM | POA: Diagnosis not present

## 2020-03-28 DIAGNOSIS — I469 Cardiac arrest, cause unspecified: Secondary | ICD-10-CM | POA: Diagnosis not present

## 2020-03-28 DIAGNOSIS — R404 Transient alteration of awareness: Secondary | ICD-10-CM | POA: Diagnosis not present

## 2020-03-28 DIAGNOSIS — I4901 Ventricular fibrillation: Secondary | ICD-10-CM | POA: Diagnosis not present

## 2020-03-29 NOTE — Progress Notes (Signed)
Remote pacemaker transmission.   

## 2020-04-11 DIAGNOSIS — 419620001 Death: Secondary | SNOMED CT | POA: Diagnosis not present

## 2020-04-11 DEATH — deceased
